# Patient Record
Sex: Female | Born: 1965
Health system: Southern US, Community
[De-identification: ages and names within clinical notes are randomized; demographics above are authoritative.]

## PROBLEM LIST (undated history)

## (undated) DIAGNOSIS — H409 Unspecified glaucoma: Secondary | ICD-10-CM

## (undated) DIAGNOSIS — K219 Gastro-esophageal reflux disease without esophagitis: Secondary | ICD-10-CM

## (undated) DIAGNOSIS — D649 Anemia, unspecified: Secondary | ICD-10-CM

## (undated) DIAGNOSIS — I10 Essential (primary) hypertension: Secondary | ICD-10-CM

## (undated) DIAGNOSIS — Z972 Presence of dental prosthetic device (complete) (partial): Secondary | ICD-10-CM

## (undated) DIAGNOSIS — J069 Acute upper respiratory infection, unspecified: Secondary | ICD-10-CM

## (undated) DIAGNOSIS — Z973 Presence of spectacles and contact lenses: Secondary | ICD-10-CM

## (undated) DIAGNOSIS — N189 Chronic kidney disease, unspecified: Secondary | ICD-10-CM

## (undated) DIAGNOSIS — J189 Pneumonia, unspecified organism: Secondary | ICD-10-CM

## (undated) DIAGNOSIS — R51 Headache: Secondary | ICD-10-CM

## (undated) DIAGNOSIS — I671 Cerebral aneurysm, nonruptured: Secondary | ICD-10-CM

## (undated) HISTORY — DX: Anemia, unspecified: D64.9

## (undated) HISTORY — PX: COLONOSCOPY: SHX174

## (undated) HISTORY — PX: MULTIPLE TOOTH EXTRACTIONS: SHX2053

## (undated) HISTORY — PX: TUBAL LIGATION: SHX77

## (undated) HISTORY — PX: CEREBRAL ANEURYSM REPAIR: SHX164

---

## 1998-12-21 ENCOUNTER — Other Ambulatory Visit: Admission: RE | Admit: 1998-12-21 | Discharge: 1998-12-21 | Payer: Self-pay | Admitting: Obstetrics and Gynecology

## 1999-04-20 ENCOUNTER — Encounter: Admission: RE | Admit: 1999-04-20 | Discharge: 1999-07-19 | Payer: Self-pay | Admitting: Emergency Medicine

## 1999-08-16 ENCOUNTER — Encounter: Admission: RE | Admit: 1999-08-16 | Discharge: 1999-08-16 | Payer: Self-pay | Admitting: Internal Medicine

## 1999-08-25 ENCOUNTER — Encounter: Admission: RE | Admit: 1999-08-25 | Discharge: 1999-08-25 | Payer: Self-pay | Admitting: Internal Medicine

## 1999-08-25 ENCOUNTER — Encounter: Payer: Self-pay | Admitting: Internal Medicine

## 1999-08-25 ENCOUNTER — Ambulatory Visit (HOSPITAL_COMMUNITY): Admission: RE | Admit: 1999-08-25 | Discharge: 1999-08-25 | Payer: Self-pay | Admitting: Internal Medicine

## 1999-10-13 ENCOUNTER — Encounter: Admission: RE | Admit: 1999-10-13 | Discharge: 1999-10-13 | Payer: Self-pay | Admitting: Internal Medicine

## 1999-11-18 ENCOUNTER — Encounter: Admission: RE | Admit: 1999-11-18 | Discharge: 1999-11-18 | Payer: Self-pay | Admitting: Internal Medicine

## 2000-01-26 ENCOUNTER — Encounter: Admission: RE | Admit: 2000-01-26 | Discharge: 2000-01-26 | Payer: Self-pay | Admitting: Internal Medicine

## 2000-02-07 ENCOUNTER — Encounter: Admission: RE | Admit: 2000-02-07 | Discharge: 2000-02-07 | Payer: Self-pay | Admitting: Internal Medicine

## 2000-06-05 ENCOUNTER — Encounter: Admission: RE | Admit: 2000-06-05 | Discharge: 2000-06-05 | Payer: Self-pay | Admitting: Internal Medicine

## 2000-06-16 ENCOUNTER — Emergency Department (HOSPITAL_COMMUNITY): Admission: EM | Admit: 2000-06-16 | Discharge: 2000-06-16 | Payer: Self-pay | Admitting: Emergency Medicine

## 2000-06-19 ENCOUNTER — Encounter: Admission: RE | Admit: 2000-06-19 | Discharge: 2000-06-19 | Payer: Self-pay | Admitting: Internal Medicine

## 2001-02-05 ENCOUNTER — Other Ambulatory Visit: Admission: RE | Admit: 2001-02-05 | Discharge: 2001-02-05 | Payer: Self-pay | Admitting: Obstetrics & Gynecology

## 2001-02-05 ENCOUNTER — Encounter: Admission: RE | Admit: 2001-02-05 | Discharge: 2001-02-05 | Payer: Self-pay | Admitting: Obstetrics & Gynecology

## 2001-02-05 ENCOUNTER — Encounter (INDEPENDENT_AMBULATORY_CARE_PROVIDER_SITE_OTHER): Payer: Self-pay | Admitting: Internal Medicine

## 2001-02-05 LAB — CONVERTED CEMR LAB: Pap Smear: NORMAL

## 2001-02-28 ENCOUNTER — Emergency Department (HOSPITAL_COMMUNITY): Admission: EM | Admit: 2001-02-28 | Discharge: 2001-02-28 | Payer: Self-pay | Admitting: Emergency Medicine

## 2001-02-28 ENCOUNTER — Encounter: Payer: Self-pay | Admitting: Emergency Medicine

## 2001-03-04 ENCOUNTER — Encounter: Admission: RE | Admit: 2001-03-04 | Discharge: 2001-03-04 | Payer: Self-pay | Admitting: Internal Medicine

## 2001-03-11 ENCOUNTER — Encounter: Payer: Self-pay | Admitting: Internal Medicine

## 2001-03-11 ENCOUNTER — Encounter: Admission: RE | Admit: 2001-03-11 | Discharge: 2001-03-11 | Payer: Self-pay | Admitting: Internal Medicine

## 2001-03-11 ENCOUNTER — Ambulatory Visit (HOSPITAL_COMMUNITY): Admission: RE | Admit: 2001-03-11 | Discharge: 2001-03-11 | Payer: Self-pay | Admitting: Obstetrics & Gynecology

## 2001-03-11 ENCOUNTER — Ambulatory Visit (HOSPITAL_COMMUNITY): Admission: RE | Admit: 2001-03-11 | Discharge: 2001-03-11 | Payer: Self-pay | Admitting: Internal Medicine

## 2001-03-18 ENCOUNTER — Encounter: Admission: RE | Admit: 2001-03-18 | Discharge: 2001-03-18 | Payer: Self-pay | Admitting: Internal Medicine

## 2001-03-18 ENCOUNTER — Encounter: Payer: Self-pay | Admitting: Internal Medicine

## 2001-03-26 ENCOUNTER — Encounter: Admission: RE | Admit: 2001-03-26 | Discharge: 2001-03-26 | Payer: Self-pay | Admitting: Internal Medicine

## 2001-05-10 ENCOUNTER — Encounter: Payer: Self-pay | Admitting: Internal Medicine

## 2001-05-10 ENCOUNTER — Encounter: Admission: RE | Admit: 2001-05-10 | Discharge: 2001-05-10 | Payer: Self-pay | Admitting: Internal Medicine

## 2001-05-10 ENCOUNTER — Ambulatory Visit (HOSPITAL_COMMUNITY): Admission: RE | Admit: 2001-05-10 | Discharge: 2001-05-10 | Payer: Self-pay | Admitting: Internal Medicine

## 2001-06-10 ENCOUNTER — Encounter: Admission: RE | Admit: 2001-06-10 | Discharge: 2001-06-10 | Payer: Self-pay | Admitting: Internal Medicine

## 2001-08-05 ENCOUNTER — Encounter: Admission: RE | Admit: 2001-08-05 | Discharge: 2001-08-05 | Payer: Self-pay | Admitting: Internal Medicine

## 2001-10-15 ENCOUNTER — Encounter: Payer: Self-pay | Admitting: Internal Medicine

## 2001-10-15 ENCOUNTER — Encounter: Admission: RE | Admit: 2001-10-15 | Discharge: 2001-10-15 | Payer: Self-pay | Admitting: Internal Medicine

## 2002-01-27 ENCOUNTER — Encounter: Admission: RE | Admit: 2002-01-27 | Discharge: 2002-01-27 | Payer: Self-pay | Admitting: Internal Medicine

## 2002-02-24 ENCOUNTER — Encounter: Admission: RE | Admit: 2002-02-24 | Discharge: 2002-02-24 | Payer: Self-pay | Admitting: Internal Medicine

## 2002-04-10 ENCOUNTER — Encounter: Admission: RE | Admit: 2002-04-10 | Discharge: 2002-04-10 | Payer: Self-pay | Admitting: Internal Medicine

## 2002-12-23 ENCOUNTER — Encounter: Admission: RE | Admit: 2002-12-23 | Discharge: 2002-12-23 | Payer: Self-pay | Admitting: Internal Medicine

## 2003-06-24 ENCOUNTER — Encounter: Admission: RE | Admit: 2003-06-24 | Discharge: 2003-06-24 | Payer: Self-pay | Admitting: Internal Medicine

## 2004-02-11 ENCOUNTER — Encounter: Admission: RE | Admit: 2004-02-11 | Discharge: 2004-02-11 | Payer: Self-pay | Admitting: Internal Medicine

## 2004-02-23 ENCOUNTER — Encounter: Admission: RE | Admit: 2004-02-23 | Discharge: 2004-02-23 | Payer: Self-pay | Admitting: Internal Medicine

## 2004-11-01 ENCOUNTER — Emergency Department (HOSPITAL_COMMUNITY): Admission: EM | Admit: 2004-11-01 | Discharge: 2004-11-01 | Payer: Self-pay | Admitting: Family Medicine

## 2005-01-05 ENCOUNTER — Ambulatory Visit: Payer: Self-pay | Admitting: Internal Medicine

## 2005-02-07 ENCOUNTER — Ambulatory Visit: Payer: Self-pay | Admitting: Internal Medicine

## 2005-11-22 ENCOUNTER — Ambulatory Visit: Payer: Self-pay | Admitting: Internal Medicine

## 2005-11-29 ENCOUNTER — Ambulatory Visit: Payer: Self-pay | Admitting: Internal Medicine

## 2006-01-25 ENCOUNTER — Ambulatory Visit: Payer: Self-pay | Admitting: Internal Medicine

## 2006-02-28 ENCOUNTER — Ambulatory Visit: Payer: Self-pay | Admitting: Internal Medicine

## 2006-03-26 ENCOUNTER — Ambulatory Visit: Payer: Self-pay | Admitting: Internal Medicine

## 2006-04-02 ENCOUNTER — Ambulatory Visit: Payer: Self-pay | Admitting: Internal Medicine

## 2006-04-09 ENCOUNTER — Encounter: Admission: RE | Admit: 2006-04-09 | Discharge: 2006-04-09 | Payer: Self-pay | Admitting: Internal Medicine

## 2006-07-26 ENCOUNTER — Encounter (INDEPENDENT_AMBULATORY_CARE_PROVIDER_SITE_OTHER): Payer: Self-pay | Admitting: Internal Medicine

## 2006-07-26 DIAGNOSIS — J189 Pneumonia, unspecified organism: Secondary | ICD-10-CM | POA: Insufficient documentation

## 2006-07-26 DIAGNOSIS — N6029 Fibroadenosis of unspecified breast: Secondary | ICD-10-CM | POA: Insufficient documentation

## 2006-07-26 DIAGNOSIS — J329 Chronic sinusitis, unspecified: Secondary | ICD-10-CM | POA: Insufficient documentation

## 2006-07-26 DIAGNOSIS — E119 Type 2 diabetes mellitus without complications: Secondary | ICD-10-CM | POA: Insufficient documentation

## 2006-09-20 ENCOUNTER — Ambulatory Visit: Payer: Self-pay | Admitting: Internal Medicine

## 2006-09-20 ENCOUNTER — Encounter (INDEPENDENT_AMBULATORY_CARE_PROVIDER_SITE_OTHER): Payer: Self-pay | Admitting: Internal Medicine

## 2006-09-20 LAB — CONVERTED CEMR LAB
Alkaline Phosphatase: 79 units/L (ref 39–117)
Basophils Absolute: 0 10*3/uL (ref 0.0–0.1)
CO2: 23 meq/L (ref 19–32)
Creatinine, Ser: 1.03 mg/dL (ref 0.40–1.20)
Eosinophils Relative: 3 % (ref 0–5)
HCT: 38 % (ref 36.0–46.0)
Hemoglobin: 13.1 g/dL (ref 12.0–15.0)
Lymphocytes Relative: 26 % (ref 12–46)
MCHC: 34.5 g/dL (ref 30.0–36.0)
Neutro Abs: 3.8 10*3/uL (ref 1.7–7.7)
Platelets: 271 10*3/uL (ref 150–400)
Potassium: 4.5 meq/L (ref 3.5–5.3)
RBC: 4.68 M/uL (ref 3.87–5.11)
Sodium: 136 meq/L (ref 135–145)
TSH: 0.79 microintl units/mL (ref 0.350–5.50)
Total Bilirubin: 0.5 mg/dL (ref 0.3–1.2)

## 2006-09-25 ENCOUNTER — Emergency Department (HOSPITAL_COMMUNITY): Admission: EM | Admit: 2006-09-25 | Discharge: 2006-09-25 | Payer: Self-pay | Admitting: Family Medicine

## 2006-10-18 ENCOUNTER — Emergency Department (HOSPITAL_COMMUNITY): Admission: EM | Admit: 2006-10-18 | Discharge: 2006-10-18 | Payer: Self-pay | Admitting: Family Medicine

## 2006-10-26 IMAGING — CR CHEST
1 series · 2 of 2 positions shown · non-contrast
Comparison: none

NAME: HOME

EXAM: MAMMOGRAPHY SCREENING
REASON: MAMMO...HM[PHONE_NUMBER]
The breasts reveal a moderately dense parenchymal pattern. There are
dominant benign appearing lymph nodes in both axillary regions. I see
no malignant appearing grouping of microcalcification nor areas of
architectural distortion.

[Series 1: view not recorded · 0.17mm/px · 2 of 2 slices shown]
[im 1/2]
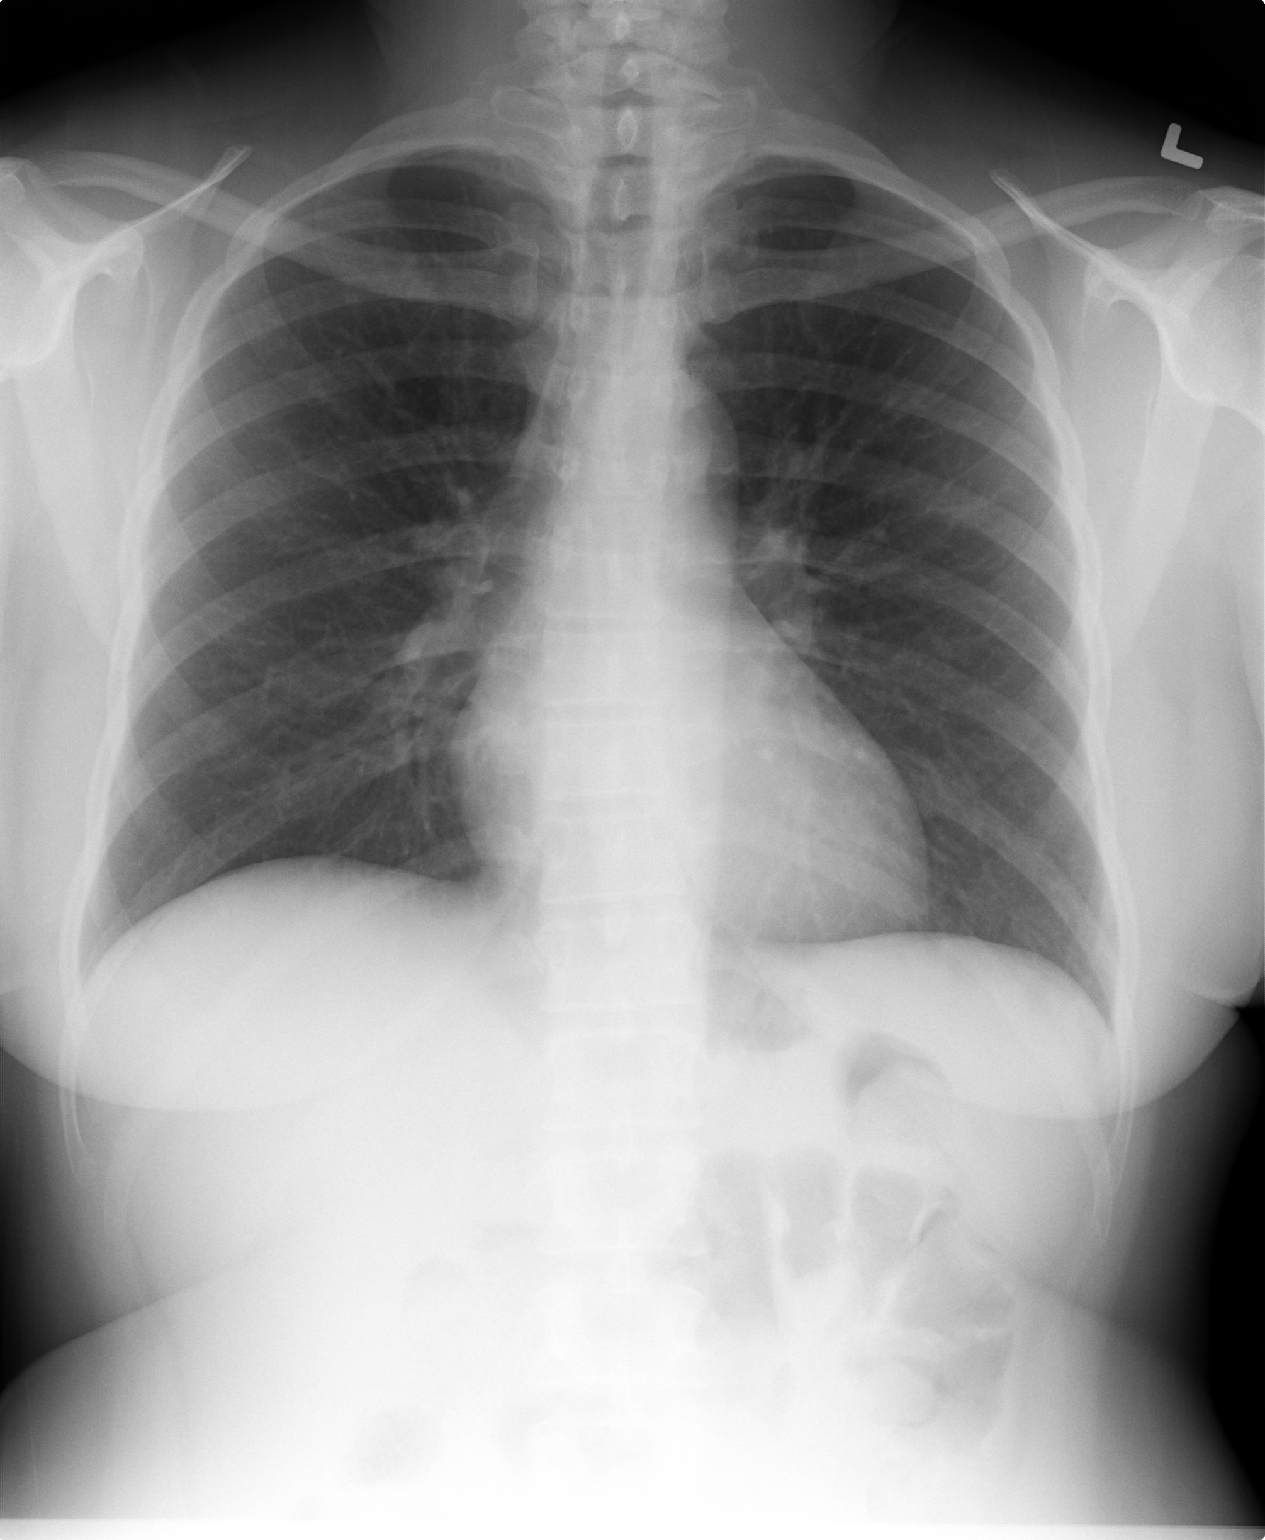
[im 2/2]
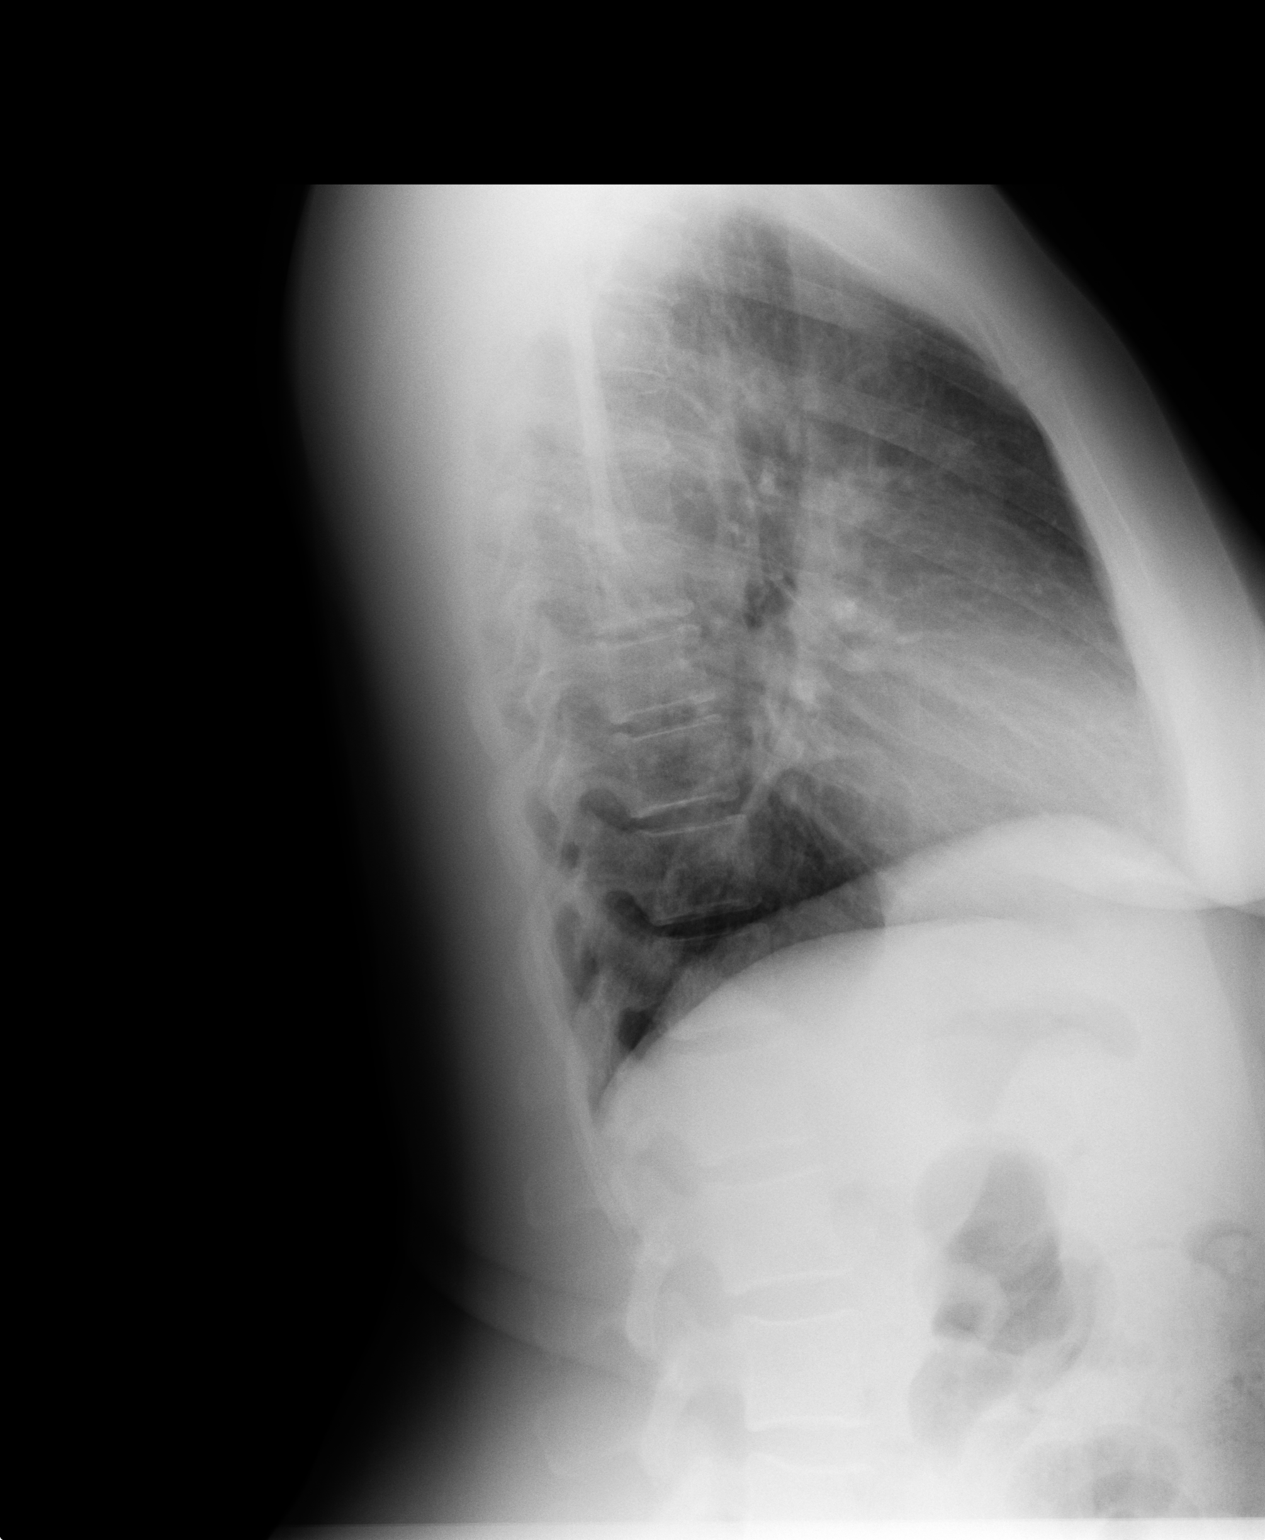

[2 of 2 positions shown; findings below may reference images not displayed]

IMPRESSION: Moderately dense breasts without finding suspicious for malignancy.

BI-RADS: Category 2--Benign Findings.

RECOMMENDATION: Please continue to encourage yearly follow up.

NAME: HOME

HOME NEGATIVE MAMMOGRAM REPORT DOES NOT PRECLUDE BIOPSY OR OTHER EVALUATION

<pp>

## 2007-05-24 ENCOUNTER — Emergency Department (HOSPITAL_COMMUNITY): Admission: EM | Admit: 2007-05-24 | Discharge: 2007-05-24 | Payer: Self-pay | Admitting: Family Medicine

## 2008-07-23 IMAGING — CR DG CHEST 2V
2 series · 2 of 2 positions shown · non-contrast
Comparison: None

CLINICAL DATA: Fever.  Flu symptoms.

CHEST - 2 VIEW

[w chest pa]
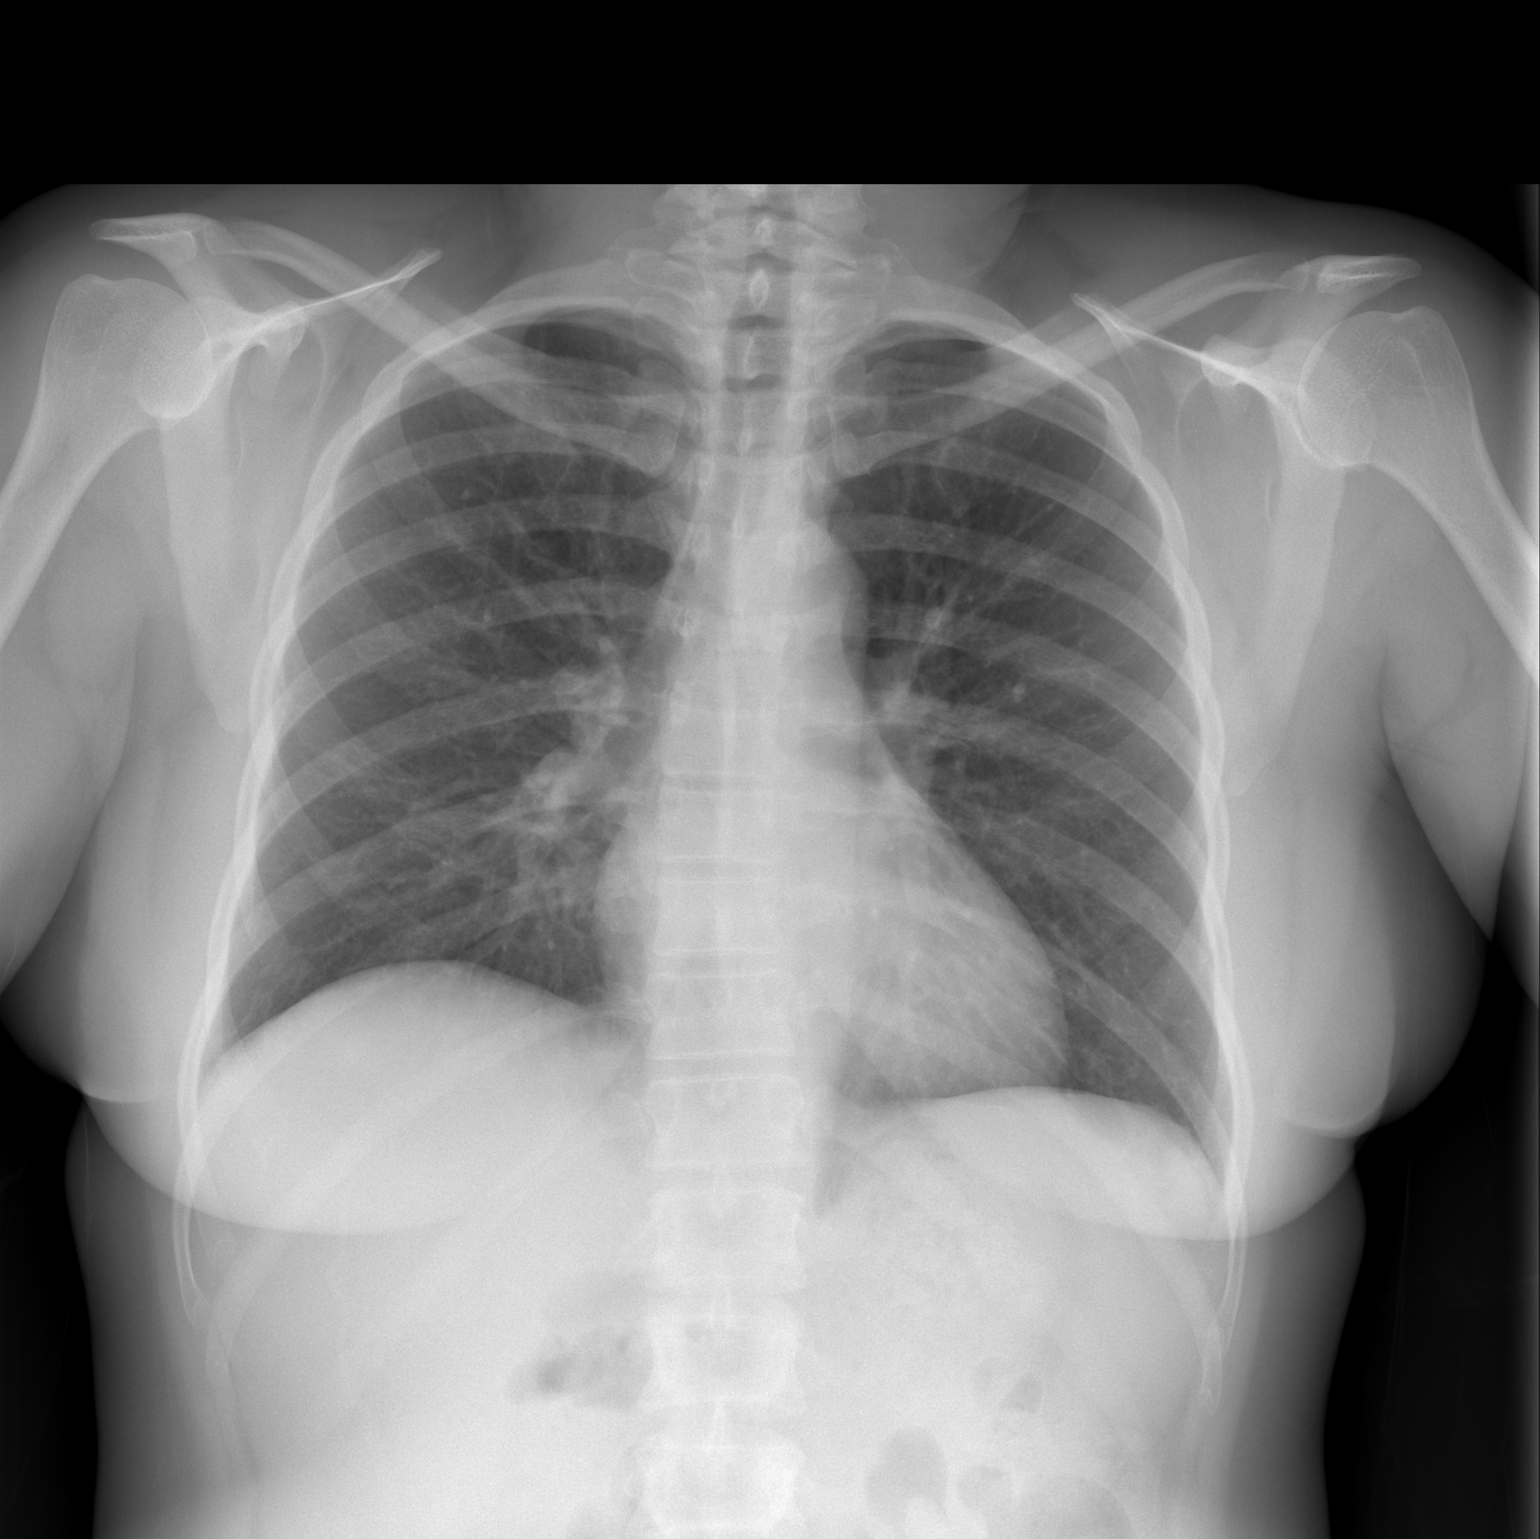

[w chest lat]
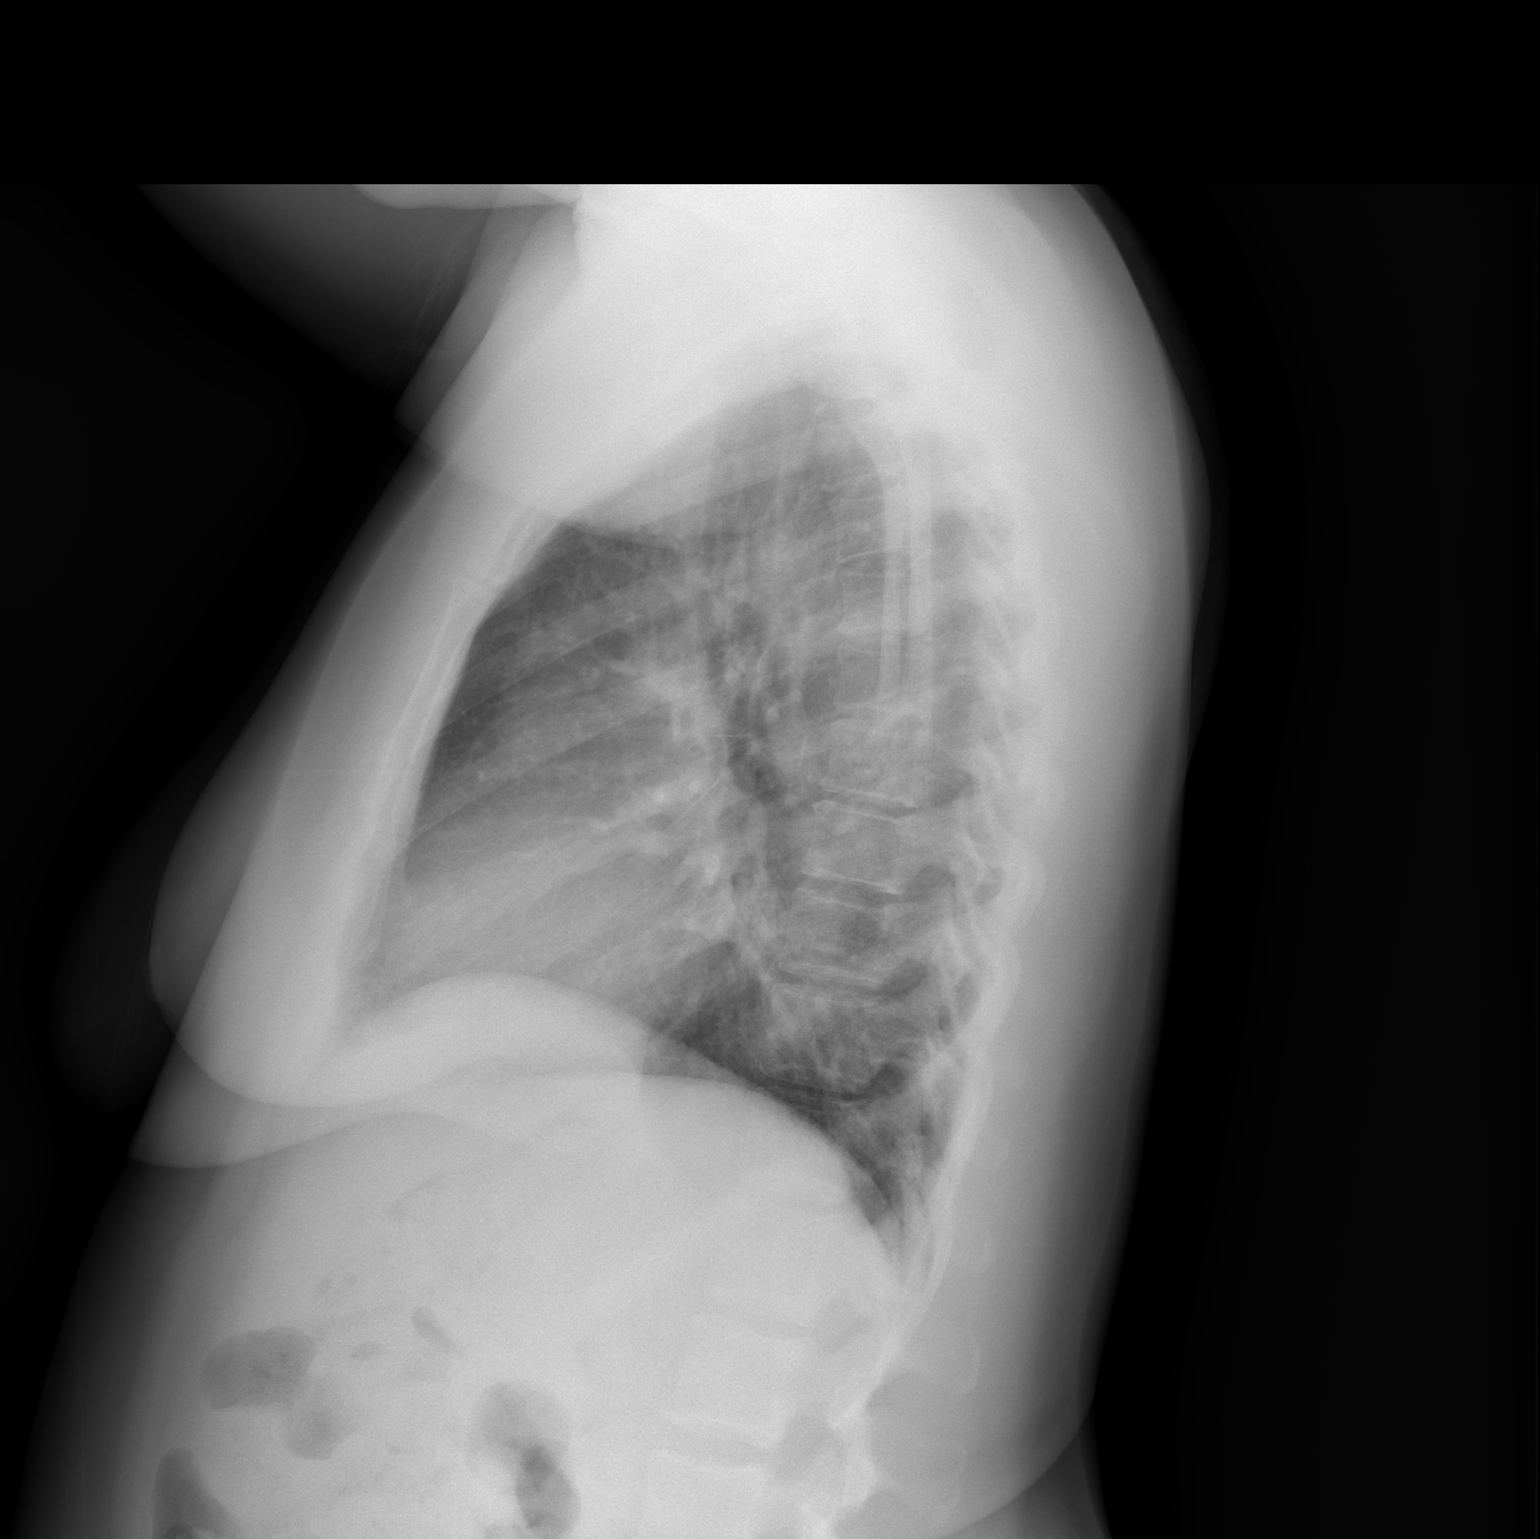

[2 of 2 positions shown; findings below may reference images not displayed]

FINDINGS: The heart size and mediastinal contours are within normal
limits.  Both lungs are clear.  The visualized skeletal structures
are unremarkable.
IMPRESSION: No active cardiopulmonary disease.

## 2008-07-24 ENCOUNTER — Inpatient Hospital Stay (HOSPITAL_COMMUNITY): Admission: EM | Admit: 2008-07-24 | Discharge: 2008-07-29 | Payer: Self-pay | Admitting: Emergency Medicine

## 2008-07-24 IMAGING — CR DG ABDOMEN 1V
1 series · 1 of 1 positions shown · non-contrast
Comparison: None available.

CLINICAL DATA: Nausea.  No abdominal pain.

ABDOMEN - 1 VIEW

[view not recorded]
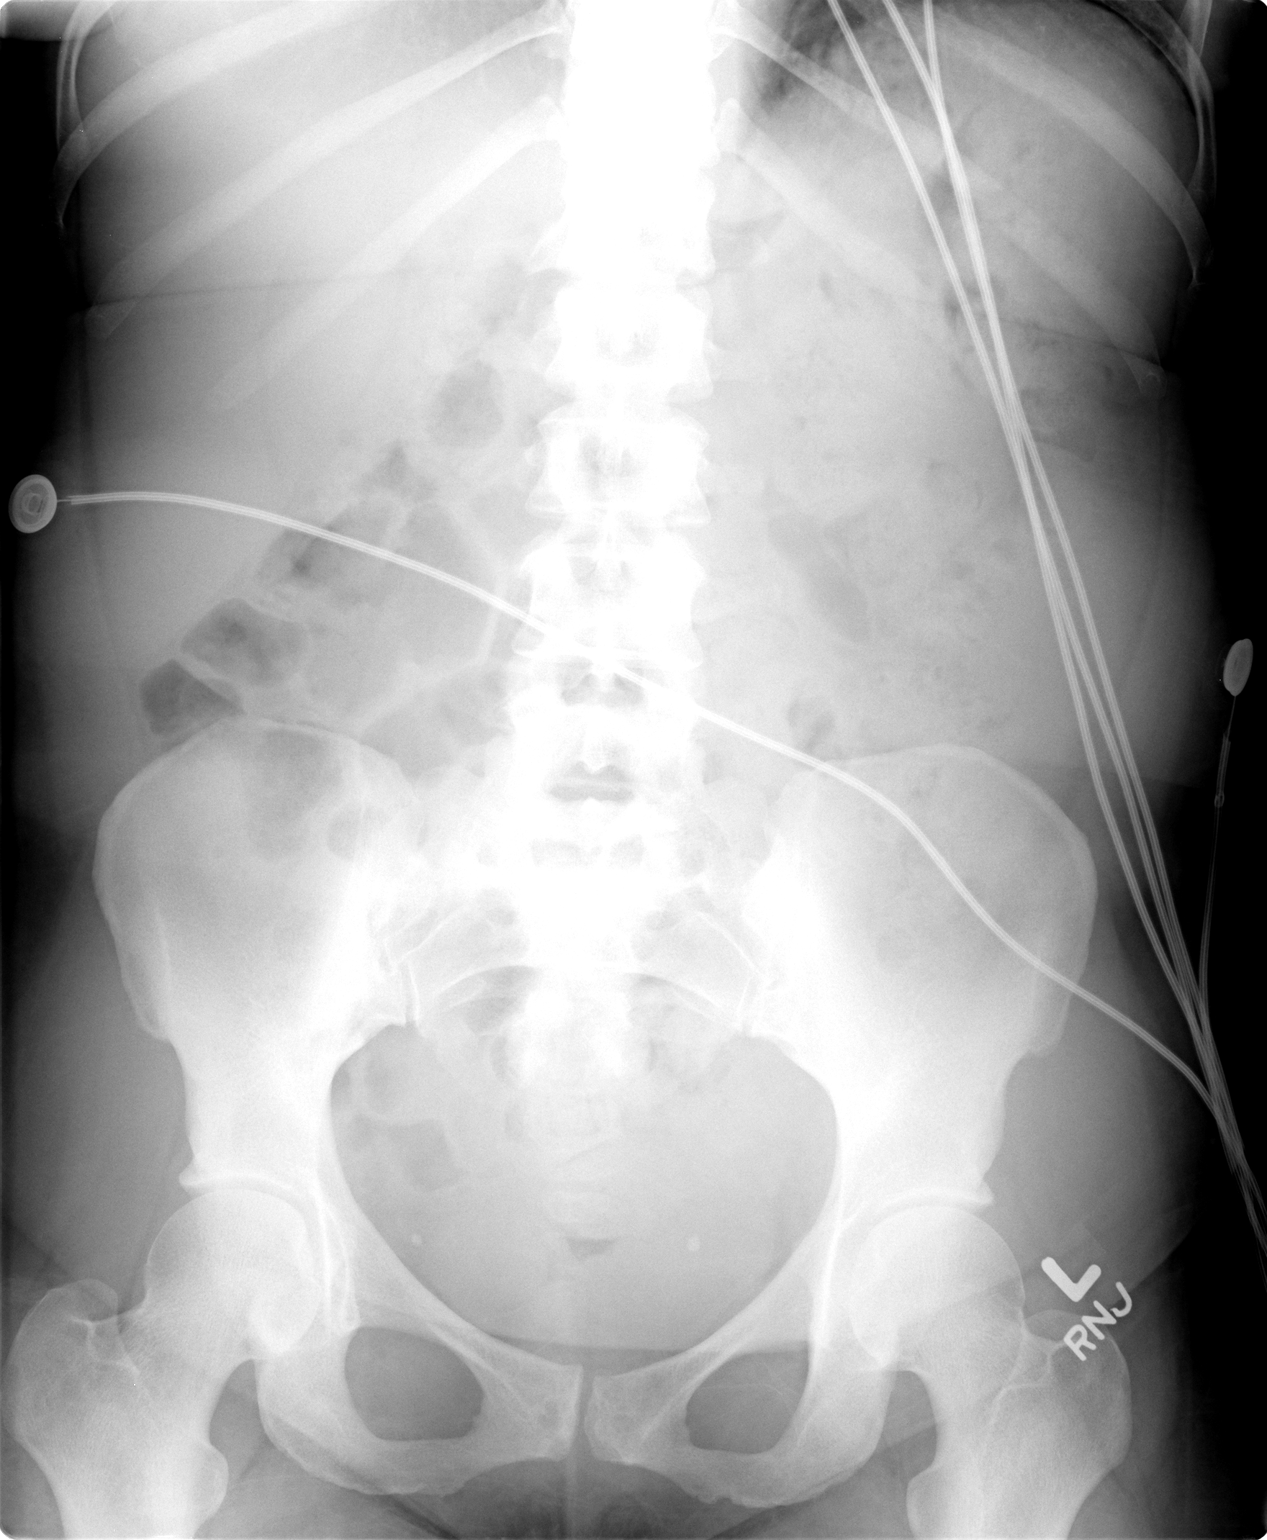

[1 of 1 positions shown; findings below may reference images not displayed]

FINDINGS: The bowel gas pattern is normal.  No unexpected abdominal
calcifications or focal bony abnormality.
IMPRESSION: Negative exam.

## 2008-07-24 IMAGING — CR DG CHEST 1V PORT
1 series · 1 of 1 positions shown · non-contrast
Comparison: [DATE].

CLINICAL DATA: Fever.  Influenza.

PORTABLE CHEST - 1 VIEW

[view not recorded]
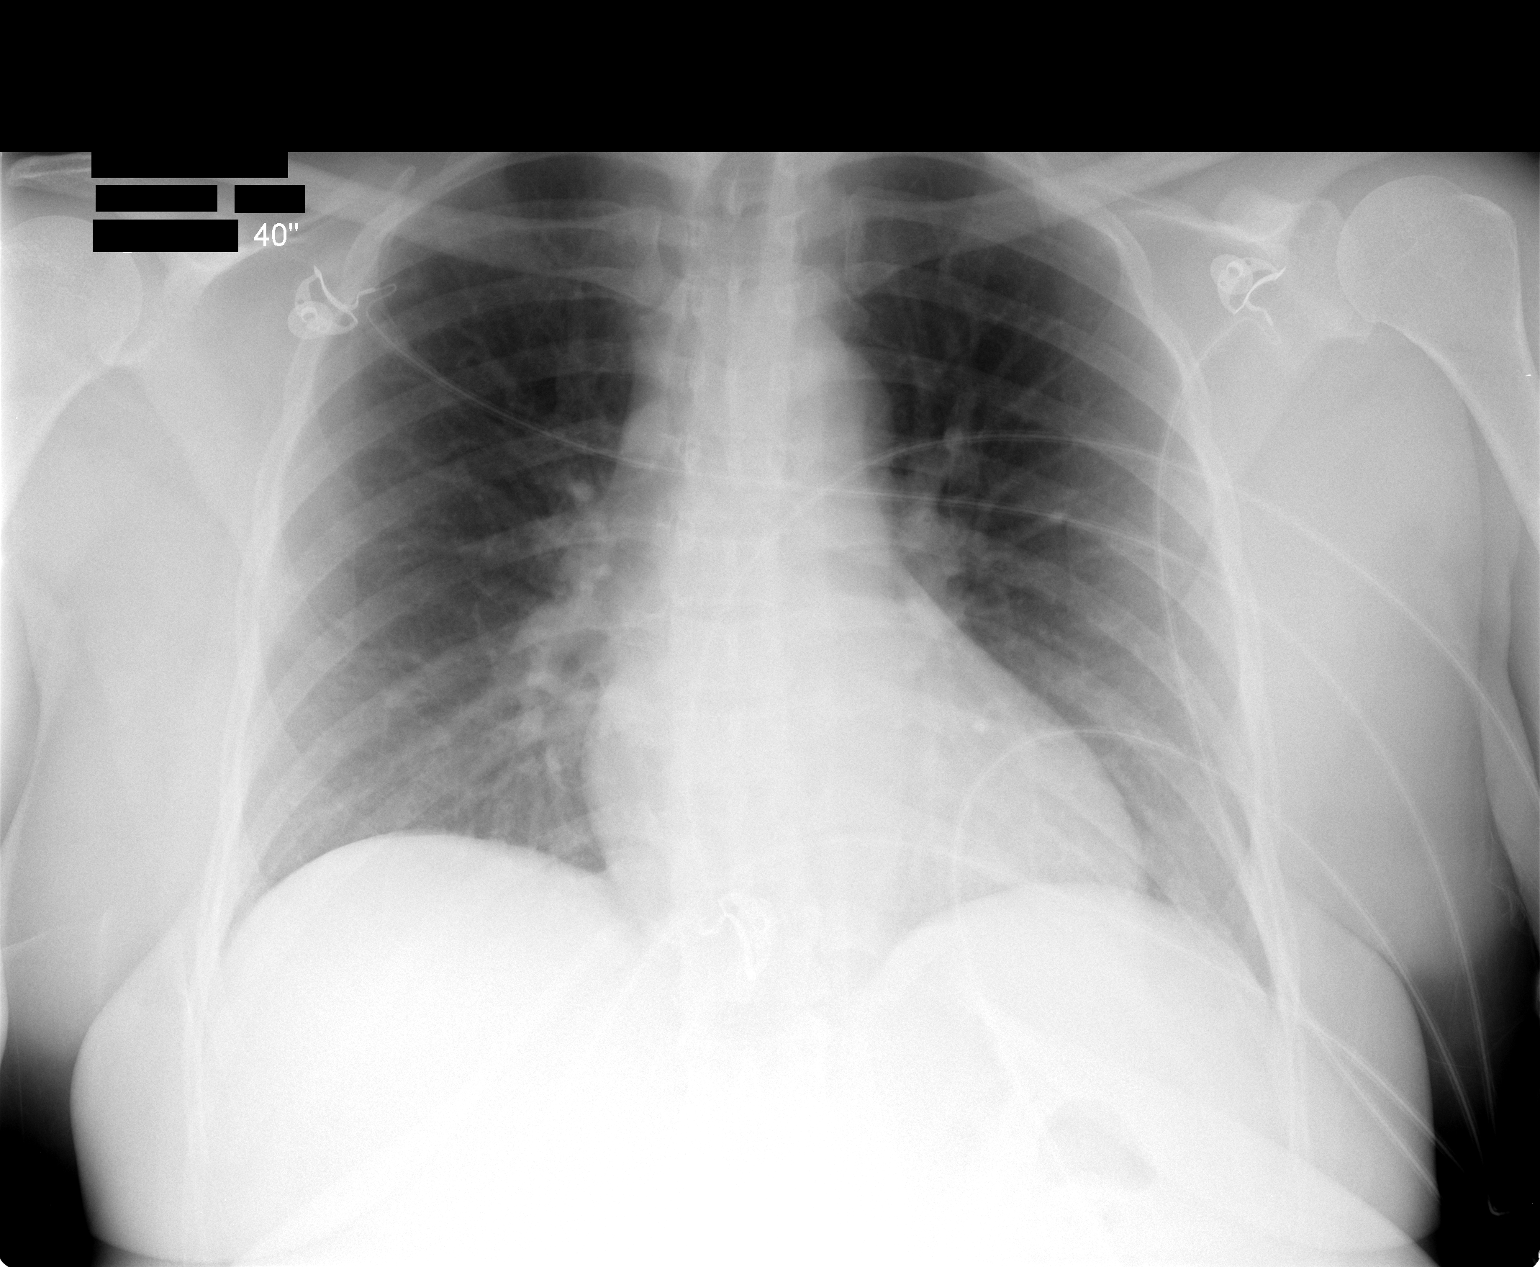

[1 of 1 positions shown; findings below may reference images not displayed]

FINDINGS: The lungs remain clear and there is no infiltrate or
effusion.  No edema is present.
IMPRESSION: No significant abnormality and no interval change.

## 2008-07-24 IMAGING — US US RENAL
1 series · 14 of 25 positions shown · non-contrast
Comparison: None.

CLINICAL DATA: Urinary tract infection.  Renal failure.

RENAL/ URINARY TRACT ULTRASOUND
TECHNIQUE: Complete ultrasound examination of the urinary tract
was performed including evaluation of the kidneys, renal collecting
systems, and urinary bladder.

[Series 1: unknown · 0.27mm/px · 14 of 33 slices shown]
[im 1/33]
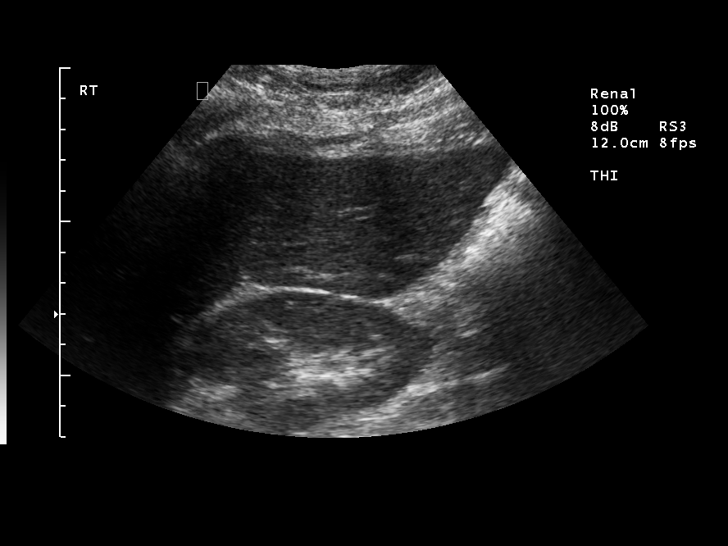
[im 3/33]
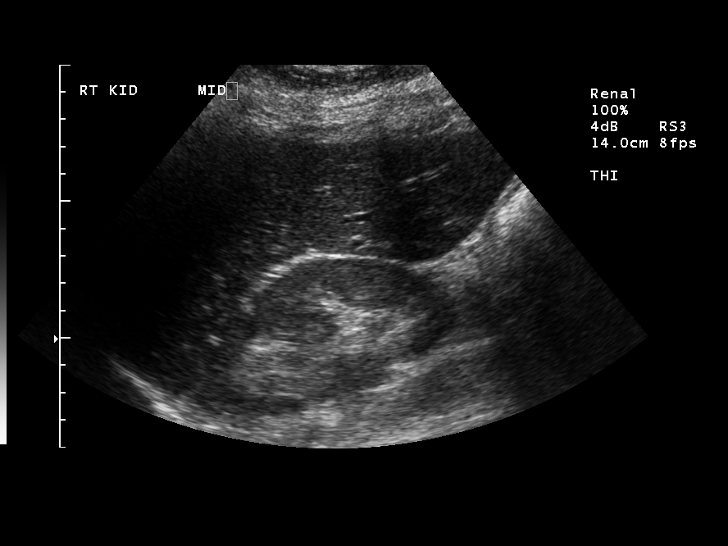
[im 6/33]
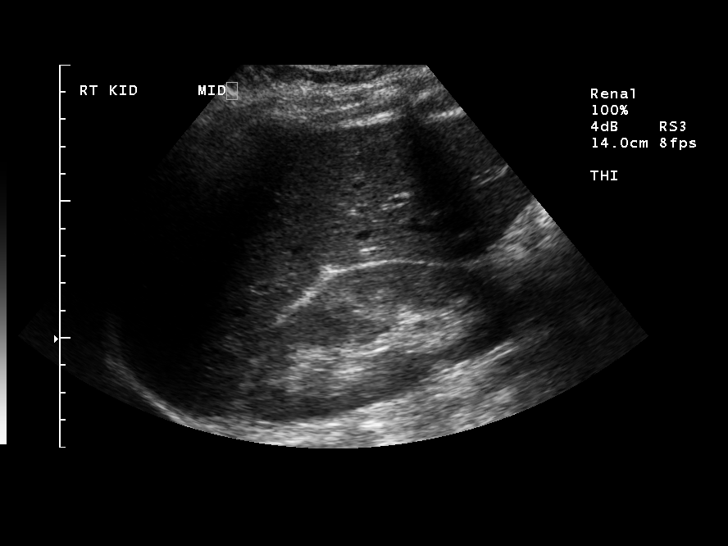
[im 9/33]
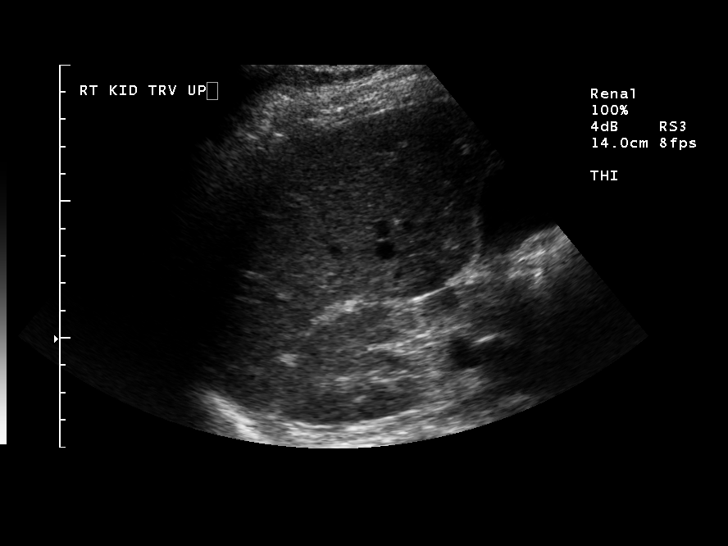
[im 11/33]
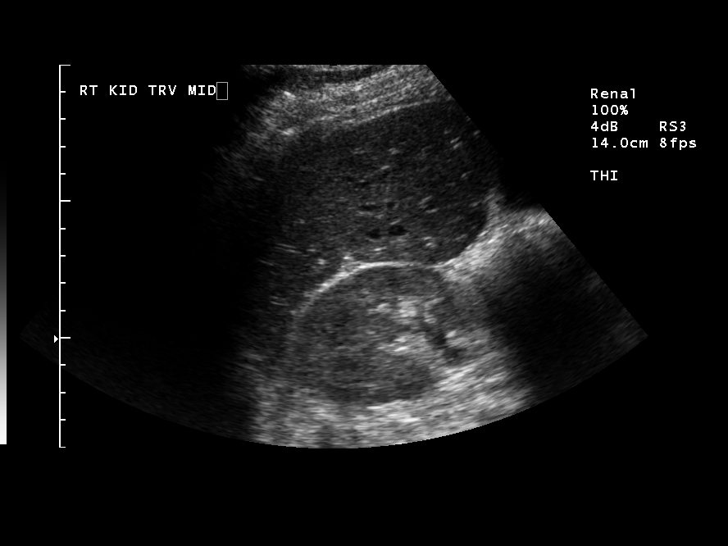
[im 13/33]
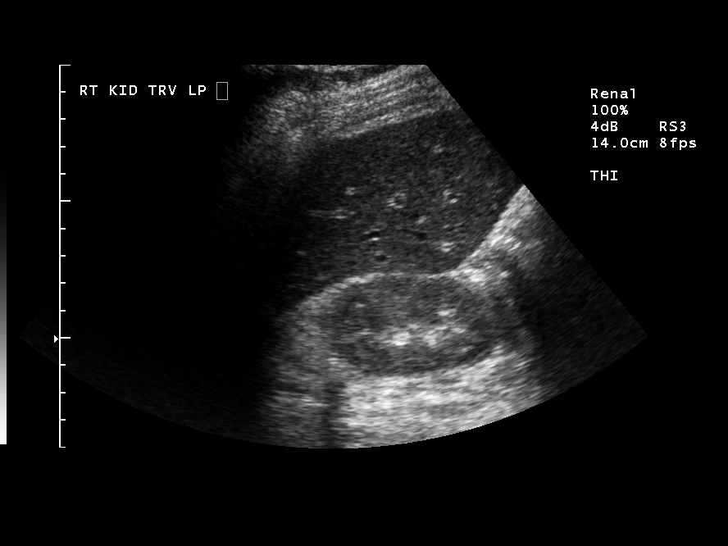
[im 15/33]
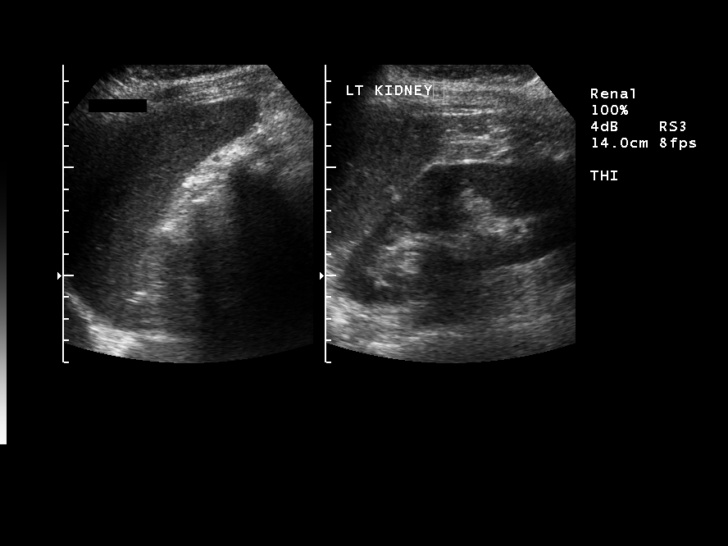
[im 18/33]
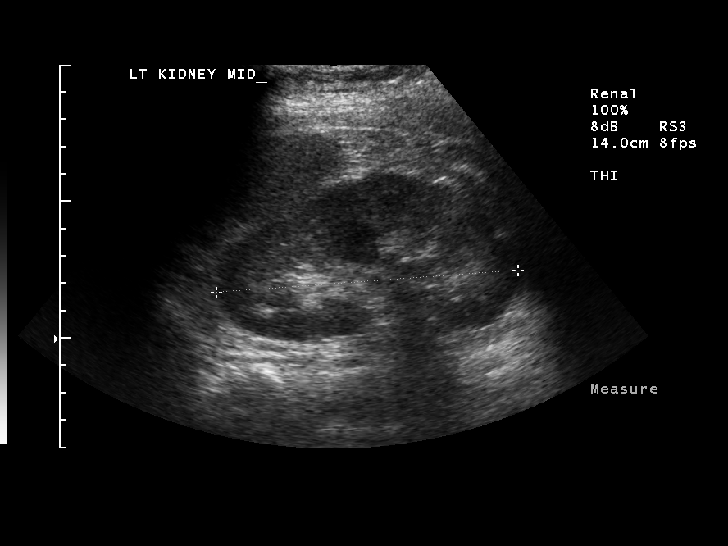
[im 21/33]
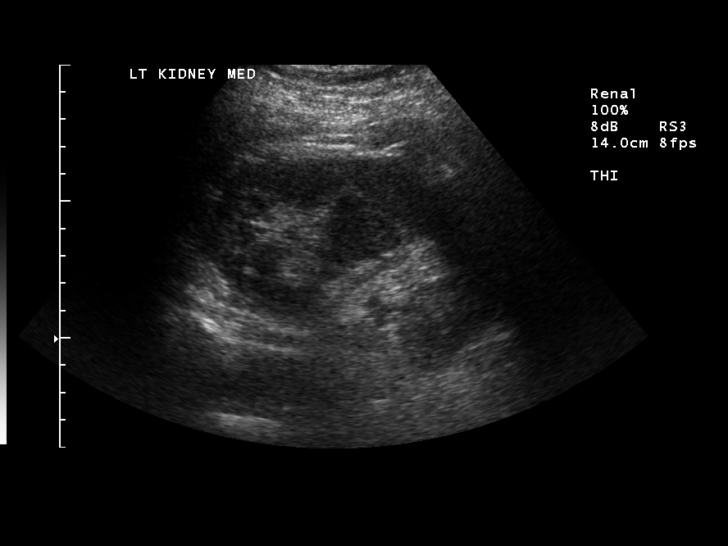
[im 22/33]
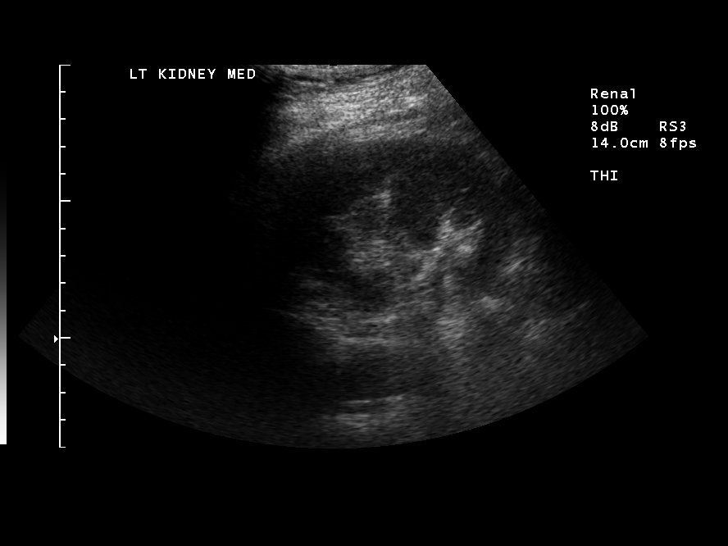
[im 25/33]
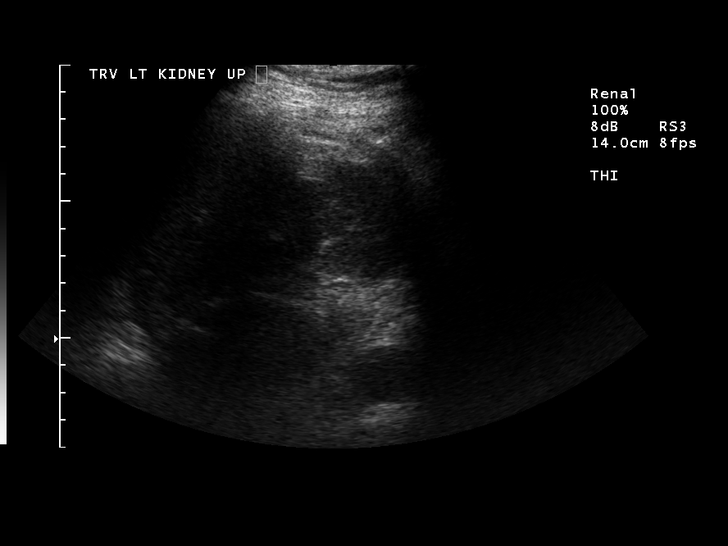
[im 27/33]
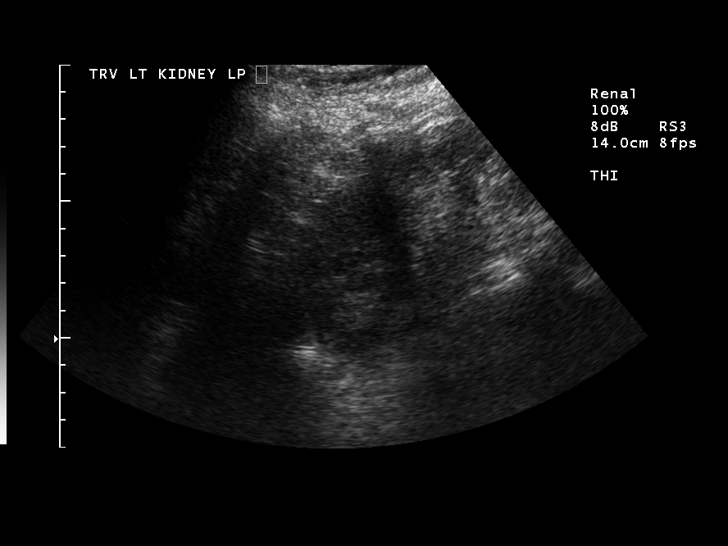
[im 30/33]
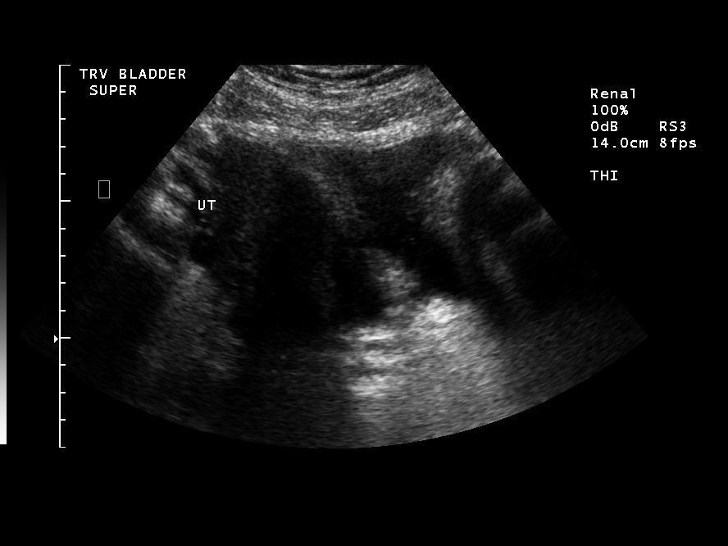
[im 33/33]
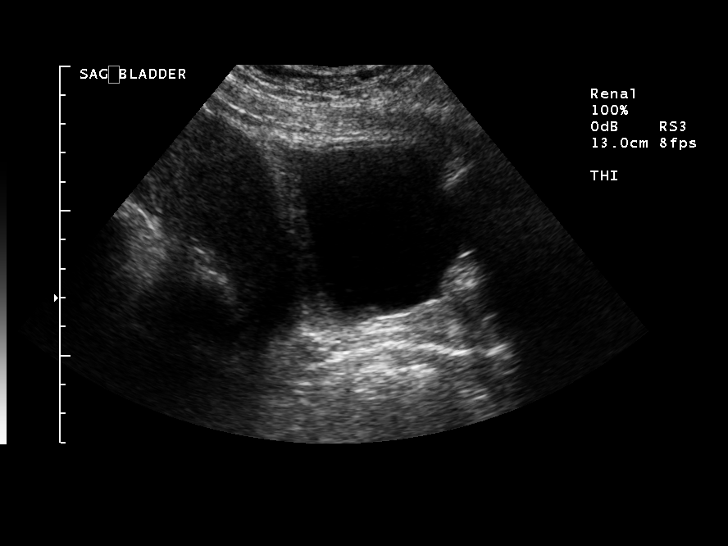

[14 of 25 positions shown; findings below may reference images not displayed]

FINDINGS: Both kidneys are within normal limits in size and
parenchymal echogenicity.  No renal parenchymal lesions are seen.
There is no evidence of hydronephrosis.  No perinephric
abnormalities are seen.  Images of the urinary bladder are
unremarkable for the degree of bladder filling.
IMPRESSION: Normal study.

## 2008-07-27 IMAGING — CR DG CHEST 1V PORT
1 series · 1 of 1 positions shown · non-contrast
Comparison: [DATE].

CLINICAL DATA: PICC line placement.

PORTABLE CHEST - 1 VIEW

[view not recorded]
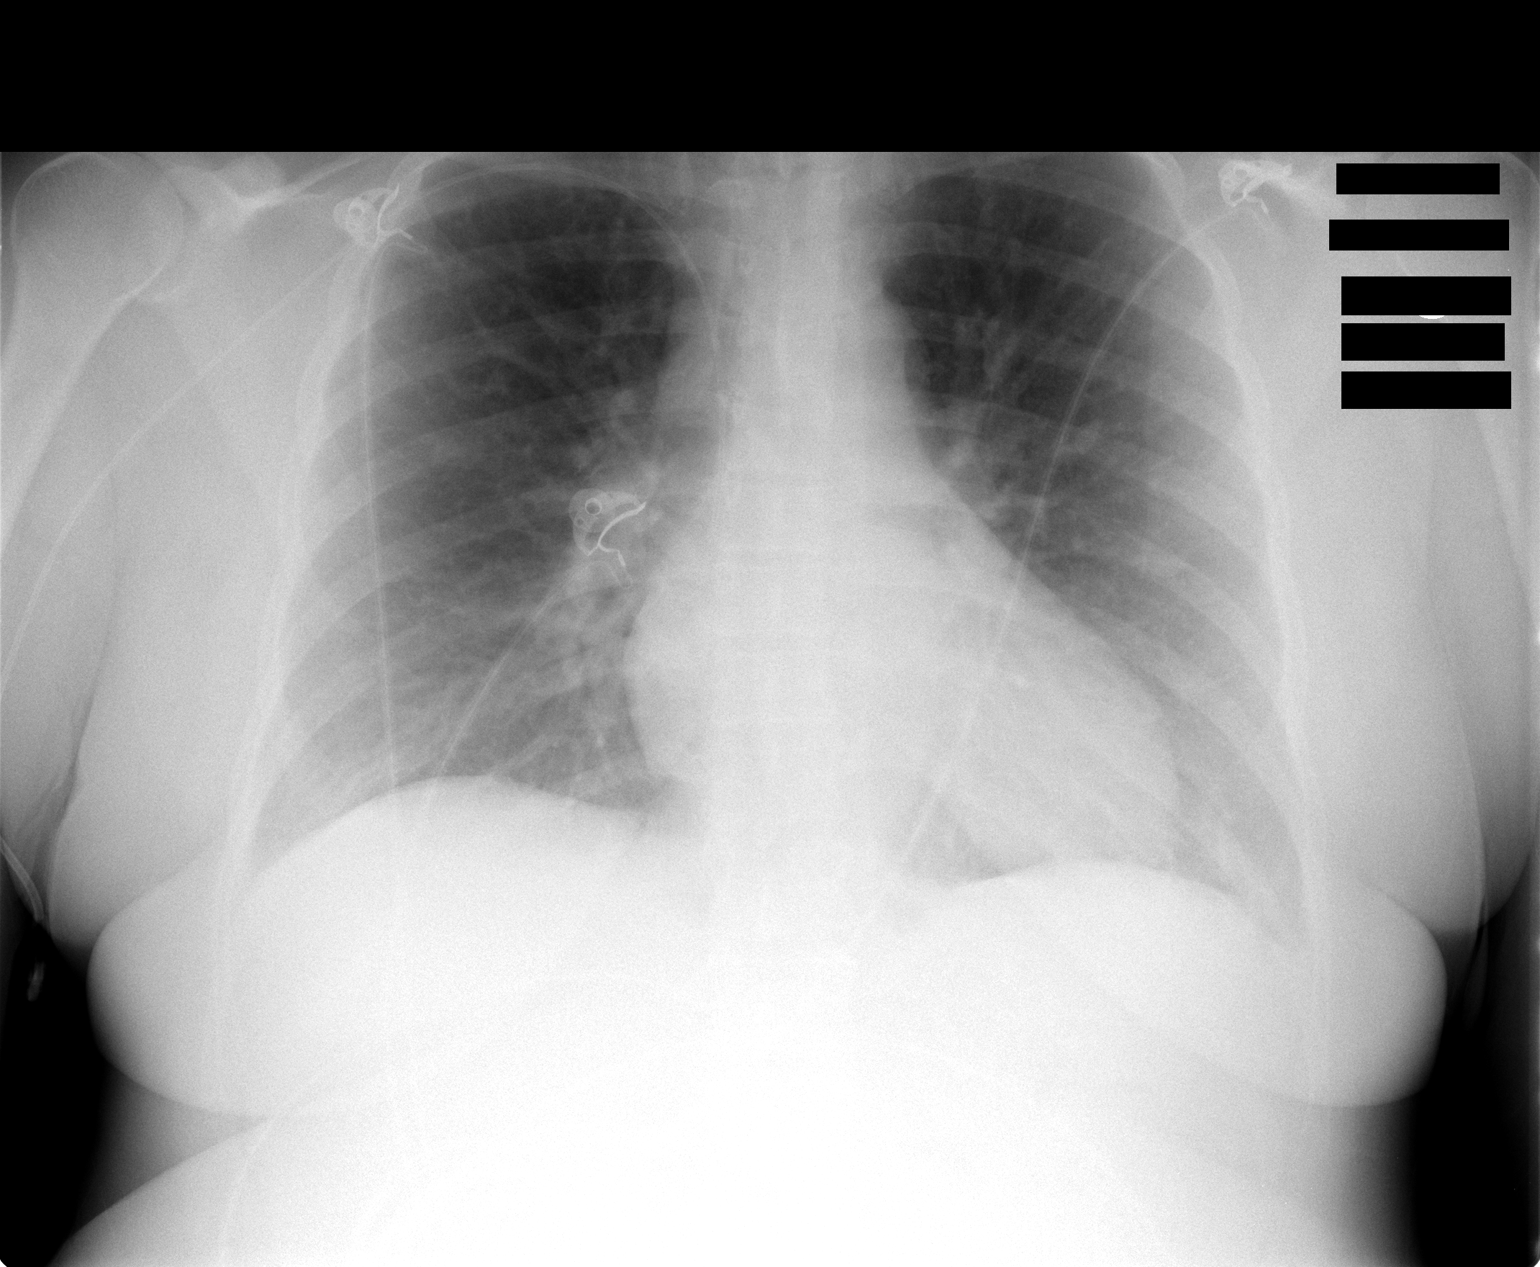

[1 of 1 positions shown; findings below may reference images not displayed]

FINDINGS: Right PICC line tip distal superior vena cava.  Central
pulmonary vascular prominence.  Left base subsegmental atelectasis.
No pneumothorax.  Heart size within normal limits.
IMPRESSION: Right PICC line tip distal superior vena cava.

Central pulmonary vascular prominence.

Left base subsegmental atelectasis.

## 2009-02-01 ENCOUNTER — Emergency Department (HOSPITAL_COMMUNITY): Admission: EM | Admit: 2009-02-01 | Discharge: 2009-02-02 | Payer: Self-pay | Admitting: Emergency Medicine
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
## 2010-12-12 ENCOUNTER — Inpatient Hospital Stay (HOSPITAL_COMMUNITY)
Admission: EM | Admit: 2010-12-12 | Discharge: 2010-12-20 | DRG: 833 | Disposition: A | Discharge status: Home / Self Care | Payer: BC Managed Care – PPO | Attending: Neurosurgery | Admitting: Neurosurgery

## 2010-12-12 ENCOUNTER — Other Ambulatory Visit: Payer: Self-pay | Admitting: Family Medicine

## 2010-12-12 ENCOUNTER — Emergency Department (HOSPITAL_COMMUNITY): Payer: BC Managed Care – PPO

## 2010-12-12 ENCOUNTER — Ambulatory Visit
Admission: RE | Admit: 2010-12-12 | Discharge: 2010-12-12 | Disposition: A | Discharge status: Home / Self Care | Payer: BC Managed Care – PPO | Source: Ambulatory Visit | Attending: Family Medicine | Admitting: Family Medicine

## 2010-12-12 DIAGNOSIS — E119 Type 2 diabetes mellitus without complications: Secondary | ICD-10-CM | POA: Diagnosis present

## 2010-12-12 DIAGNOSIS — I609 Nontraumatic subarachnoid hemorrhage, unspecified: Principal | ICD-10-CM | POA: Diagnosis present

## 2010-12-12 DIAGNOSIS — R1110 Vomiting, unspecified: Secondary | ICD-10-CM

## 2010-12-12 DIAGNOSIS — J309 Allergic rhinitis, unspecified: Secondary | ICD-10-CM | POA: Diagnosis present

## 2010-12-12 DIAGNOSIS — I10 Essential (primary) hypertension: Secondary | ICD-10-CM | POA: Diagnosis present

## 2010-12-12 LAB — DIFFERENTIAL
Basophils Absolute: 0 10*3/uL (ref 0.0–0.1)
Eosinophils Absolute: 0 10*3/uL (ref 0.0–0.7)
Monocytes Absolute: 0.4 10*3/uL (ref 0.1–1.0)
Monocytes Relative: 7 % (ref 3–12)
Neutro Abs: 3.8 10*3/uL (ref 1.7–7.7)
Neutrophils Relative %: 73 % (ref 43–77)

## 2010-12-12 LAB — CBC
MCHC: 34.8 g/dL (ref 30.0–36.0)
MCV: 81.9 fL (ref 78.0–100.0)

## 2010-12-12 LAB — BASIC METABOLIC PANEL
BUN: 21 mg/dL (ref 6–23)
Calcium: 9.7 mg/dL (ref 8.4–10.5)
Creatinine, Ser: 1.3 mg/dL — ABNORMAL HIGH (ref 0.4–1.2)
GFR calc Af Amer: 53 mL/min — ABNORMAL LOW (ref >60)
GFR calc non Af Amer: 44 mL/min — ABNORMAL LOW (ref >60)
Glucose, Bld: 169 mg/dL — ABNORMAL HIGH (ref 70–99)
Potassium: 4.5 mEq/L (ref 3.5–5.1)
Sodium: 139 mEq/L (ref 135–145)

## 2010-12-12 IMAGING — CT CT HEAD W/O CM
1 of 3 series · 13 of 30 positions shown, 17 images · non-contrast
Comparison: None.

CLINICAL DATA: Syncope.  Headaches.  Nausea and vomiting.

CT HEAD WITHOUT CONTRAST
TECHNIQUE: Contiguous axial images were obtained from the base of
the skull through the vertex without contrast.

[Series 2: head w/o · axial · non-contrast · 0.49mm/px · z∈[+13,+132]mm · 13 of 36 slices shown, 17 images]
[im 3/36  brain]
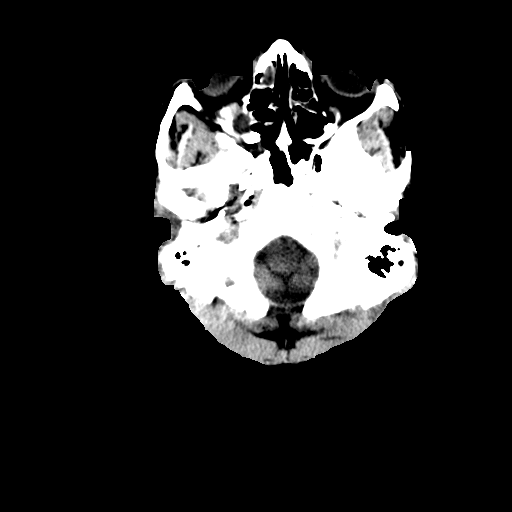
[im 3/36  bone]
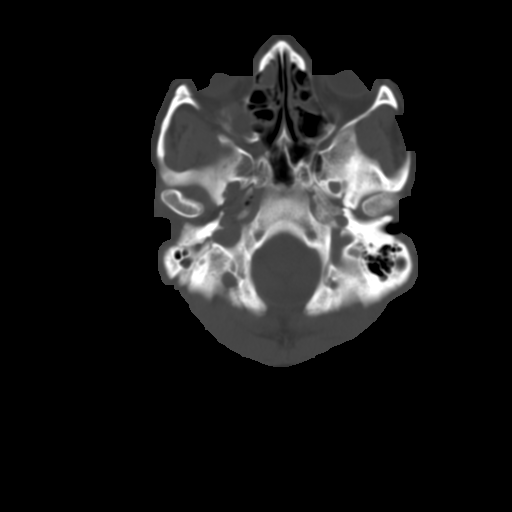
[im 6/36  brain]
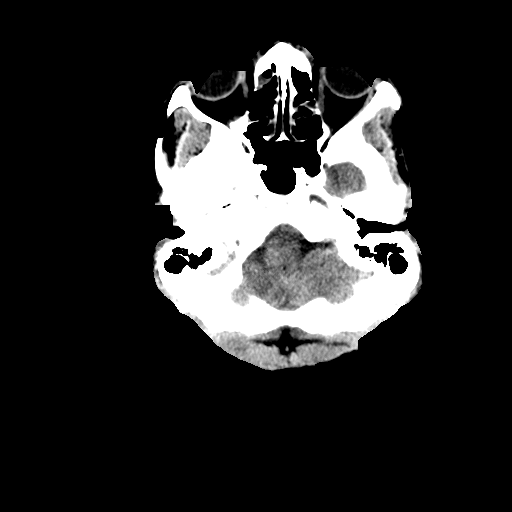
[im 8/36  brain]
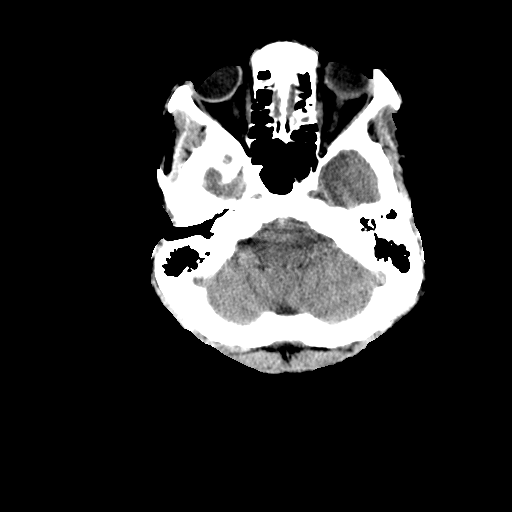
[im 11/36  brain]
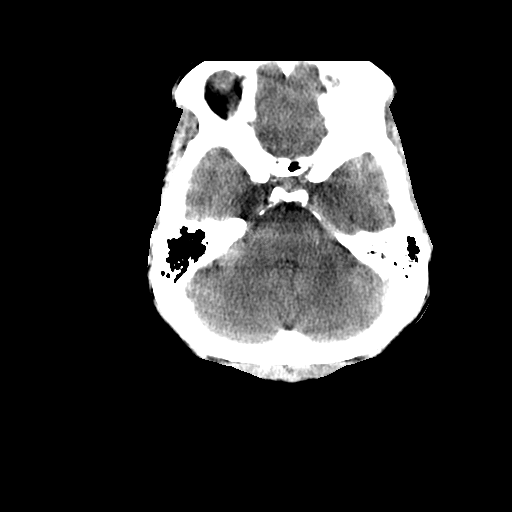
[im 13/36  brain]
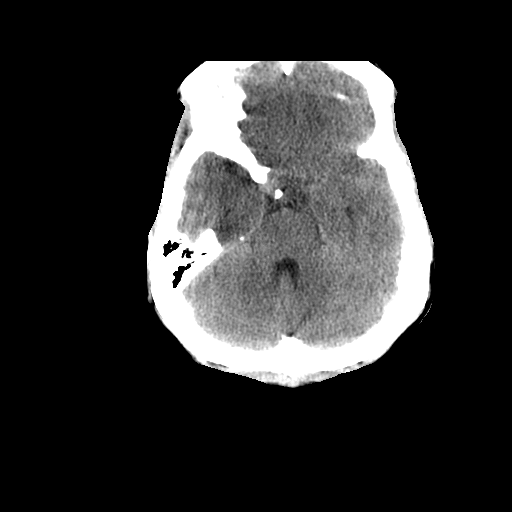
[im 13/36  bone]
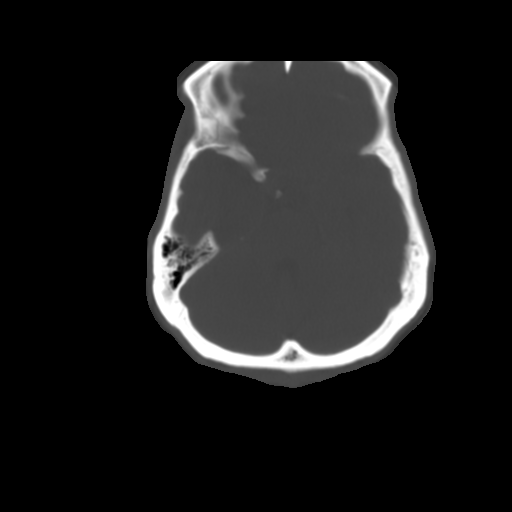
[im 16/36  brain]
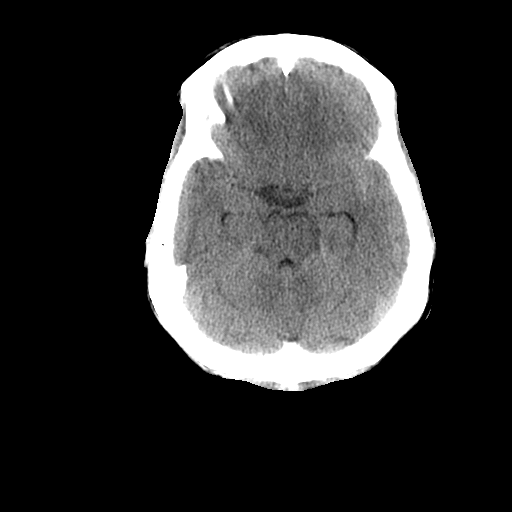
[im 18/36  brain]
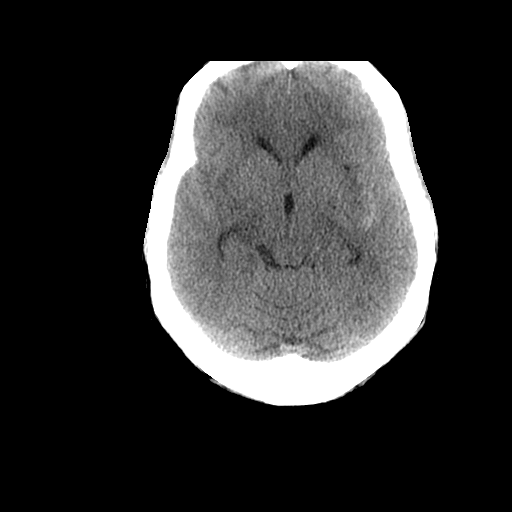
[im 21/36  brain]
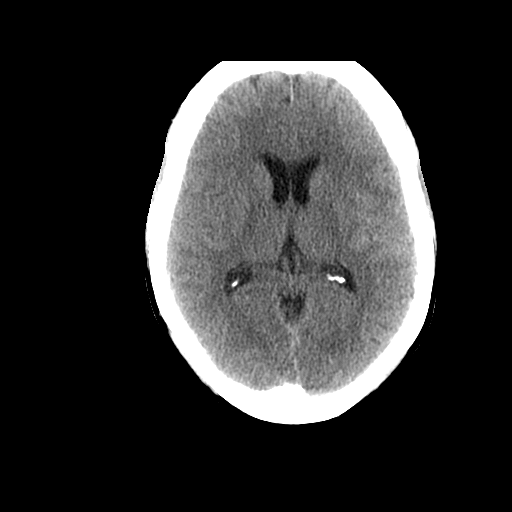
[im 23/36  brain]
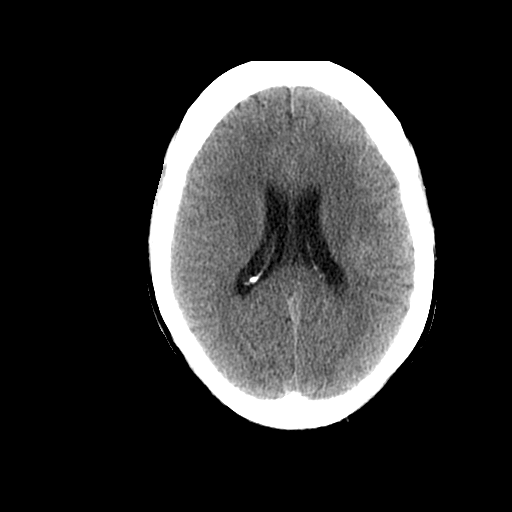
[im 23/36  bone]
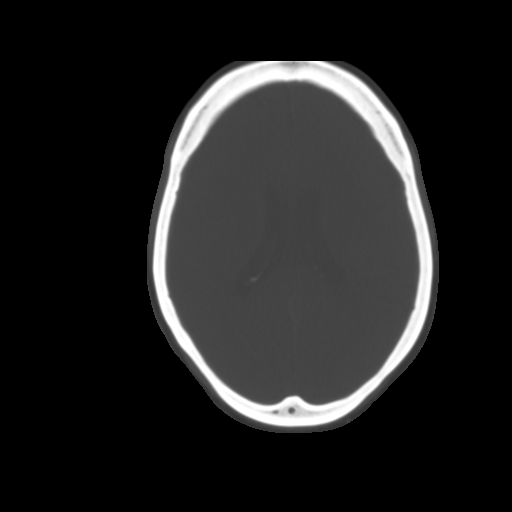
[im 26/36  brain]
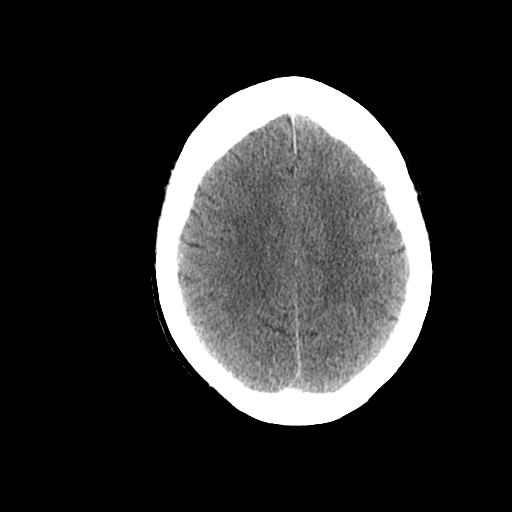
[im 28/36  brain]
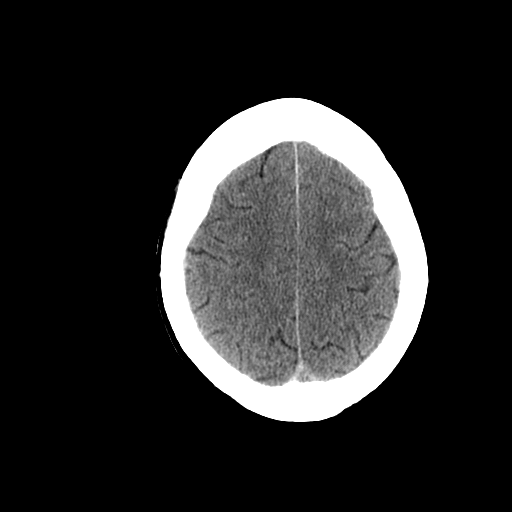
[im 31/36  brain]
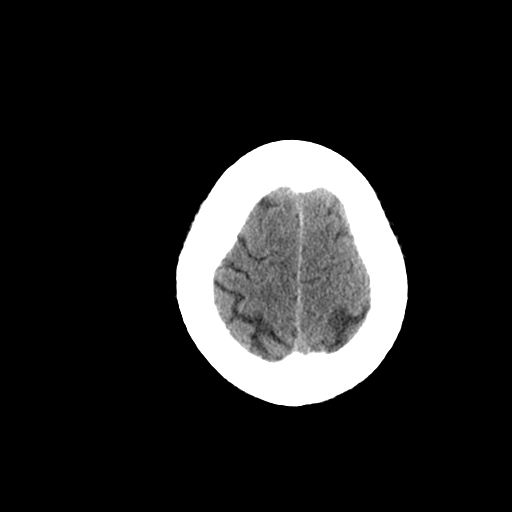
[im 33/36  brain]
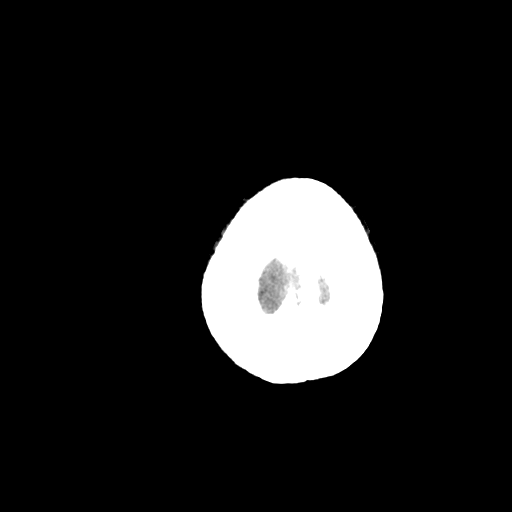
[im 33/36  bone]
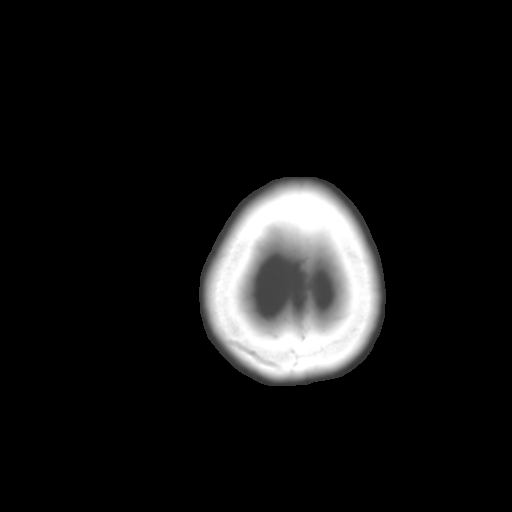

[13 of 30 positions shown; findings below may reference images not displayed]

FINDINGS: There is subtle acute subarachnoid hemorrhage primarily
in the region of the sylvian fissures.  There is no discrete
interventricular hemorrhage.  The temporal horns of the lateral
ventricles are slightly more prominent than would be expected.

The patient has air fluid levels in the sphenoid sinus and partial
opacification of the ethmoid and maxillary sinuses consistent with
sinusitis.  Osseous structures are otherwise normal.
IMPRESSION: Subtle acute subarachnoid hemorrhage.

Critical test results telephoned to Dr. ODILI at the time
of interpretation on [DATE] at [DATE] p.m. by Dr. ODILI.

## 2010-12-12 IMAGING — XA IR ANGIO/EXISTING CATHETER
1 series · 10 of 24 positions shown · IV contrast (IODINE)
Comparison: CT scan of the brain of [DATE].

CLINICAL DATA: Acute onset of headaches nausea vomiting collapse.
Subarachnoid hemorrhage on CT scan of the brain.

BILATERAL CAROTID ARTERIOGRAPHY  and BILATERAL VERTEBRAL ARTERY
angiograms followed by endovascular obliteration of ruptured left
posterior communicating artery region aneurysm.

[Series 300: neuro · 10 of 226 slices shown]
[im 10/226]
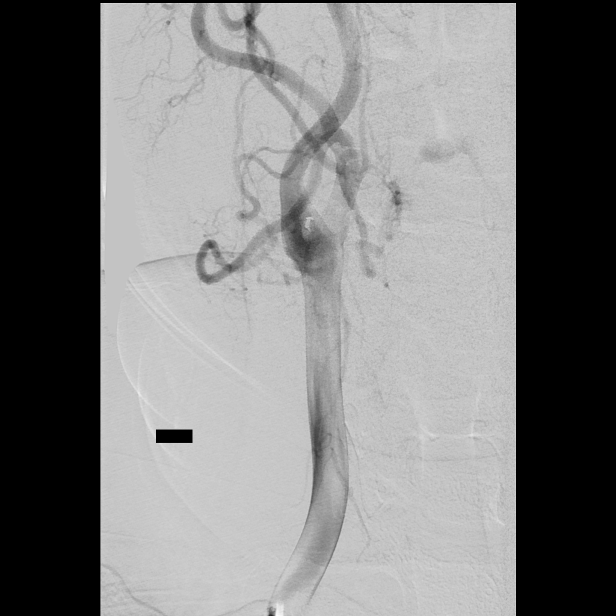
[im 30/226]
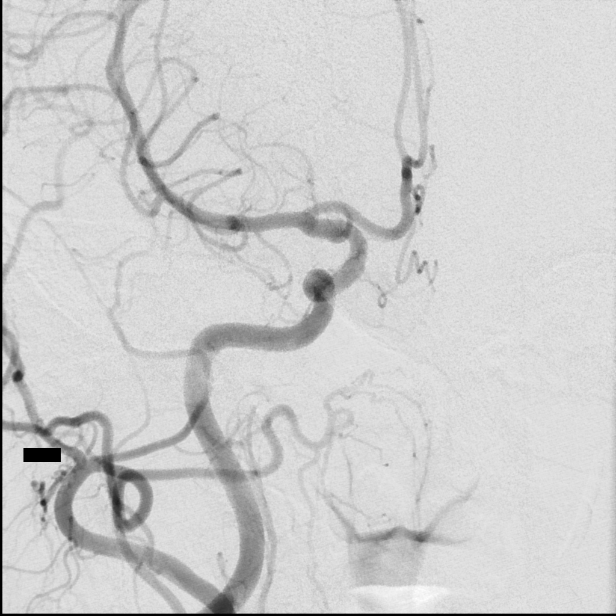
[im 59/226]
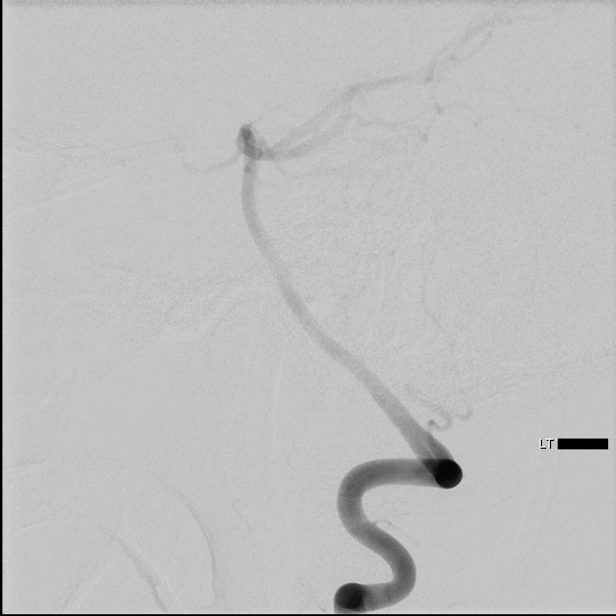
[im 79/226]
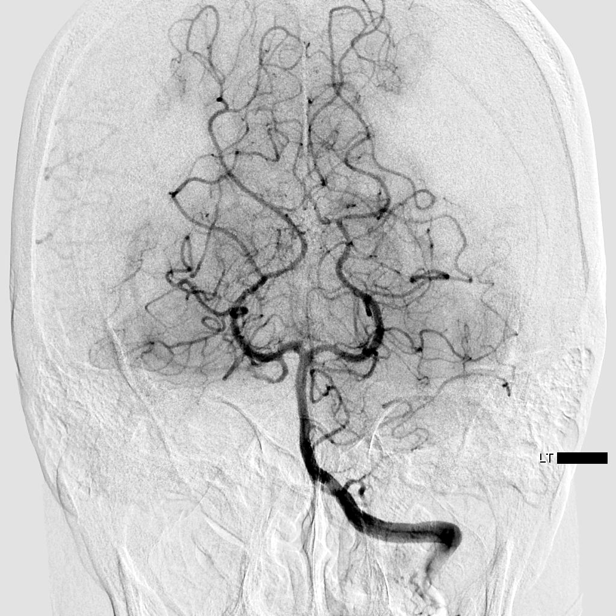
[im 98/226]
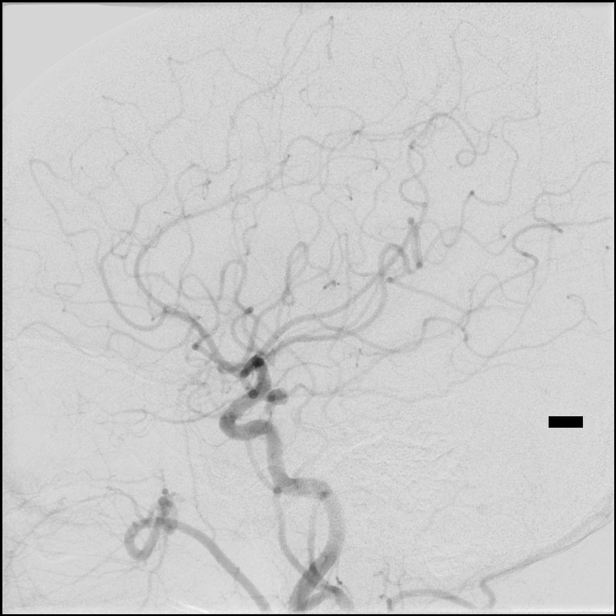
[im 128/226]
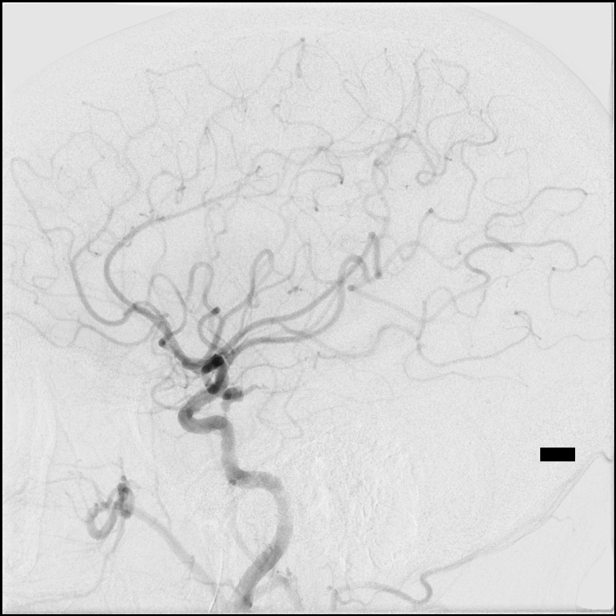
[im 147/226]
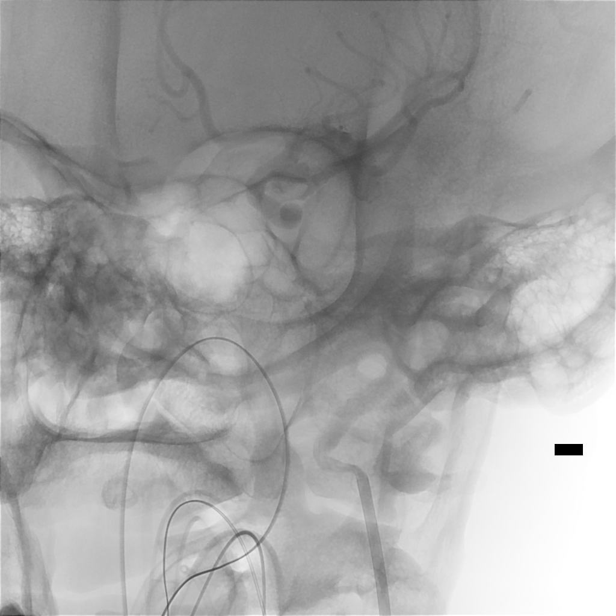
[im 177/226]
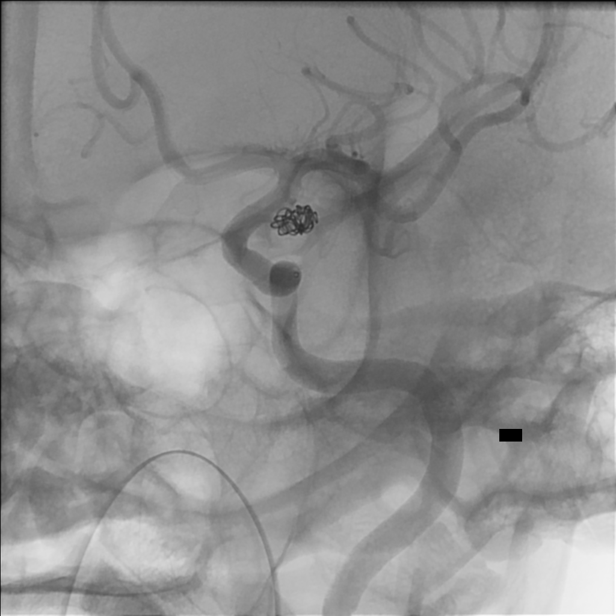
[im 196/226]
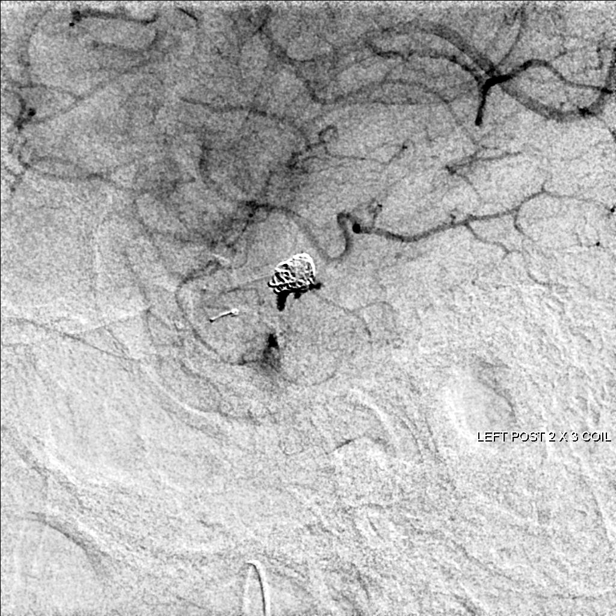
[im 216/226]
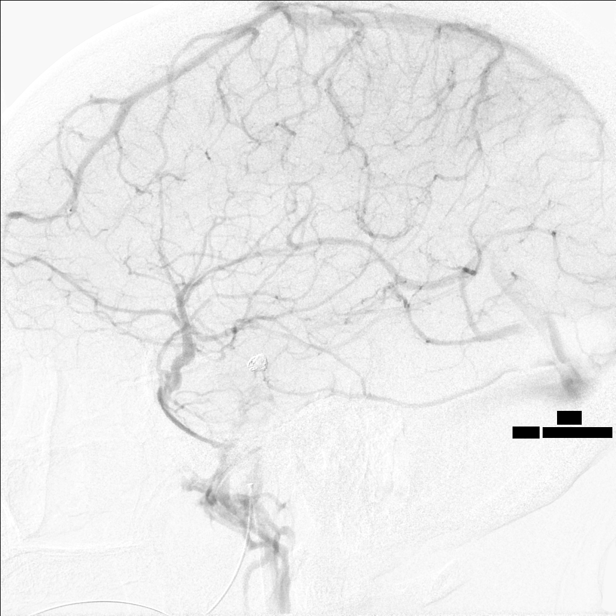

[10 of 24 positions shown; findings below may reference images not displayed]

Following full explanation of procedure along with the potential
cyst complications, an informed witnessed consent was obtained

The right groin was prepped and draped in the usual sterile
fashion.  Thereafter using modified Seldinger technique,
transfemoral access into the right common femoral artery was
obtained without difficulty.  Over a 0.03 French guidewire, 5-
French Pinnacle sheath was inserted.  Through this and also over a
0.03 French guidewire, a 5-French dual catheter was advanced to the
aortic arch region and slight positioned the right common carotid
artery, the right vertebral, the left common carotid artery and the
left vertebral artery

There were no  acute complications.  The patient tolerated
procedure well

Medications utilized Versed 1 mg IV

Fentanyl 25 mcg IV

Benadryl  50 mg IV.  Contrast [3V] approximately 50 ml.
FINDINGS: The right common carotid demonstrates the right external carotid
artery and its major branches to be normal

The right internal carotid at the bulb to the cranial skull base
opacifies normally

The petrous, cavernous and supraclinoid segments are normally
opacified

There is approximately 2 mm small infundibulum at the origin of the
right posterior communicating artery

The right middle and the right anterior cerebral arteries opacify
normally into capillary and venous phases

The right vertebral artery origin is normal.  The vessel is seen to
opacify normally to the cranial skull base.

There is normal opacification of the right posterior inferior
cerebellar artery and the right vertebrobasilar junction.
Unopacified blood is noted distal to this in the basilar artery

The left vertebral artery origin is normal.  This, vertebral artery
which is seen to opacify normally to the cranial skull base

Normal opacification of the left post inferior cerebellar and the
left vertebrobasilar junction is seen

The basilar artery, the posterior cerebral arteries, the superior
cerebellar arteries and the anterior-inferior cerebral arteries
opacify normally into capillary venous phases

Transient opacification via the right posterior communicating
artery of the right middle cerebral distribution is seen

The left common carotid demonstrates the left external carotid
artery and its major branches to be normal.  The left internal
carotid bulb to the cranial skull base is normal.  The petrous,
cavernous and supraclinoid segments are normal

Arising in the region of the left posterior communicating artery is
a bilobed irregular saccular aneurysm  measuring approximately 5. 2
mm x 5.3 mm with a  the neck of approximately 2.5 mm

Distal to this a the left middle and the left anterior cerebral
arteries are seen to opacify normally into capillary and venous
phases

The native left posterior communicating artery is seen arising from
the medial to the neck of the aneurysm.

IMPRESSION.
1.  Approximately 5.2 mm x 5.3 mm narrow neck left posterior
communicating artery region aneurysm.

Endovascular obliteration of the left posterior communicating
artery region irregular aneurysm.

Findings arteriogram were reviewed with the referring neurosurgeon.
It was elected to proceed with endovascular obliteration of the
aneurysm given the morphology.

The procedure, the risks and benefits and alternative were
discussed with the patient's family and the patient.  After
informed consent, the the patient was put under general seizure by
the [facility]ACTED[facility]

The diagnostic JB1  catheter in the left common carotid artery was
exchanged over a 0.035 inches of 300 cm Rosen exchange guidewire
for a 6-French 65 cm neurovascular sheath using biplane roadmap
technique and constant fluoroscopic guidance . Good aspiration was
obtained from the side port on the neurovascular sheath.  A gentle
contrast ejection demonstrated spasm dissections or internal
filling defects

This was then connected to continuous heparinized saline fusion

Over the Rosen exchange guidewire, 6-French 9 cm Brite tip lying
straight guide catheter was then advanced and positioned just
proximal to the left common prep bifurcation.  Guidewire was
removed.  Good aspiration obtained from the hub of the 6-French
guide catheter.

A gentle contrast injection demonstrated no evidence of spasm,
dissections or of intraluminal filling defects.

Using biplane roadmap technique and constant fluoroscopic guidance,
over a 0.035-inch Roadrunner guidewire, the 6-French guide catheter
was then advanced distal vertical segment of the left internal
carotid artery.  Guidewire was removed.  Good aspiration obtained
from the hub of the 6-French guide catheter.  Again a gentle
contrast injection demonstrated no spasm dissections or of
intraluminal filling defects.

A control and performed through 6-French guide catheter
demonstrated no change in the intracranial circulation or of the
aneurysm

At this time in a coaxial manner and with constant heparinized
saline infusion using biplane road technique and constant
fluoroscopic guidance, an accessory SL 10 to tip microcatheter
which had been steam sheath was advanced over 0.014 Softip Synchro
microguidewire to the distal end of the 6-French guide catheter and
the left internal carotid artery

With the microguidewire leading with a J-tip configuration to avoid
dissections or inducing spasm, the combination was navigated using
torque device to the proximal cm cavernous segment of the left
internal carotid artery.  The microguide was then gently advanced
into the aneurysm followed by the microcatheter.  The
microguidewire was then gently retrieved under constant
fluoroscopic guidance ensuring no sudden forward motion movements
of the tip of microcatheter.  None was observed

Good aspiration was obtained from the hub of the intra aneurysmal
microcatheter

A control to perform the 6-French guide catheter demonstrated safe
position of the tip of microcatheter for embolization to begin.
The first coil utilized was a a 3.5 mm x 6.6 mm MicruSphere
Cerecyte coil.  This was advanced in a coaxial manner and with
constant heparinized infusion using biplane roadmap technique and
constant fluoroscopic guidance to the distal tip of the entrance
the microcatheter.  This coil was then advanced into the aneurysm
with nice basket configuration being obtained.  A control
arteriogram performed through the 6-French guide catheter and the
left internal carotid artery demonstrate excellent apposition of
the coil across neck of the aneurysm.  This was then detached
without difficulty.  This then followed by a 3 mm x 4 cm DeltaPaq
Cerecyte coil, a 2.5 mm x 3.3 cm MicruSphere Cerecyte coil, a
mm x 4 cm DeltaPlush Cerecyte coil, a 2 mm x 3 cm DeltaPlush
Cerecyte coil, advancement of a additional coil was met with no
significant resistance and backing out of the microcatheter at the
neck.  This coil was therefore not deployed.

A control and performed through the 6-French guide demonstrated
complete obliteration of the left PICA region aneurysm.  The onset
of the microcatheter were seen to be a neck of the aneurysm

Microcatheter was then gently retrieved without any movement of the
coil mass.

Final control to perform the 6-French guide demonstrate no evidence
of intraluminal filling defects.  Complete obliteration of the left
PICA region aneurysm was maintained

The 6-French guide catheter and the 610 neurovascular sheath then
retrieved and abdominal aorta exchanged over a J-tip guidewire for
E 6-French Pinnacle sheath.  This was then exchanged for a common
femoral arterial closure device for hemostasis in the right groin.

No acute changes were seen in the patient's blood pressure or
neurological status.

She was then transferred to the CT scan suite for postprocedural CT
scan brain.

Impression
1. Status post endovascular complete  coil obliteration of ruptured
left posterior communicating artery region aneurysm as described
above.

## 2010-12-12 MED ORDER — IOHEXOL 300 MG/ML  SOLN: 300.0000 mL | Freq: Once | INTRAMUSCULAR | Status: AC | PRN

## 2010-12-13 ENCOUNTER — Inpatient Hospital Stay (HOSPITAL_COMMUNITY): Payer: BC Managed Care – PPO

## 2010-12-13 ENCOUNTER — Emergency Department (HOSPITAL_COMMUNITY): Payer: BC Managed Care – PPO

## 2010-12-13 DIAGNOSIS — I609 Nontraumatic subarachnoid hemorrhage, unspecified: Secondary | ICD-10-CM

## 2010-12-13 DIAGNOSIS — J9600 Acute respiratory failure, unspecified whether with hypoxia or hypercapnia: Secondary | ICD-10-CM

## 2010-12-13 LAB — BLOOD GAS, ARTERIAL
FIO2: 0.4 %
MECHVT: 550 mL
O2 Saturation: 99.4 %
PEEP: 5 cmH2O
Patient temperature: 97.2
RATE: 12 resp/min
pO2, Arterial: 182 mmHg — ABNORMAL HIGH (ref 80.0–100.0)

## 2010-12-13 LAB — BASIC METABOLIC PANEL
BUN: 22 mg/dL (ref 6–23)
CO2: 20 mEq/L (ref 19–32)
Calcium: 8.6 mg/dL (ref 8.4–10.5)
Creatinine, Ser: 1.43 mg/dL — ABNORMAL HIGH (ref 0.4–1.2)
GFR calc non Af Amer: 40 mL/min — ABNORMAL LOW (ref >60)
Glucose, Bld: 226 mg/dL — ABNORMAL HIGH (ref 70–99)
Sodium: 138 mEq/L (ref 135–145)

## 2010-12-13 LAB — CBC
MCH: 27.8 pg (ref 26.0–34.0)
MCHC: 33.9 g/dL (ref 30.0–36.0)
Platelets: 246 10*3/uL (ref 150–400)
RDW: 13.6 % (ref 11.5–15.5)

## 2010-12-13 LAB — MRSA PCR SCREENING: MRSA by PCR: NEGATIVE

## 2010-12-13 LAB — GLUCOSE, CAPILLARY
Glucose-Capillary: 218 mg/dL — ABNORMAL HIGH (ref 70–99)
Glucose-Capillary: 187 mg/dL — ABNORMAL HIGH (ref 70–99)
Glucose-Capillary: 286 mg/dL — ABNORMAL HIGH (ref 70–99)

## 2010-12-13 LAB — HEMOGLOBIN A1C: Hgb A1c MFr Bld: 6.9 % — ABNORMAL HIGH (ref <5.7)

## 2010-12-13 IMAGING — CR DG CHEST 1V PORT
1 series · 1 of 1 positions shown · non-contrast
Comparison: [DATE]

CLINICAL DATA: Evaluate ET tube placement

PORTABLE CHEST - 1 VIEW

[view not recorded]
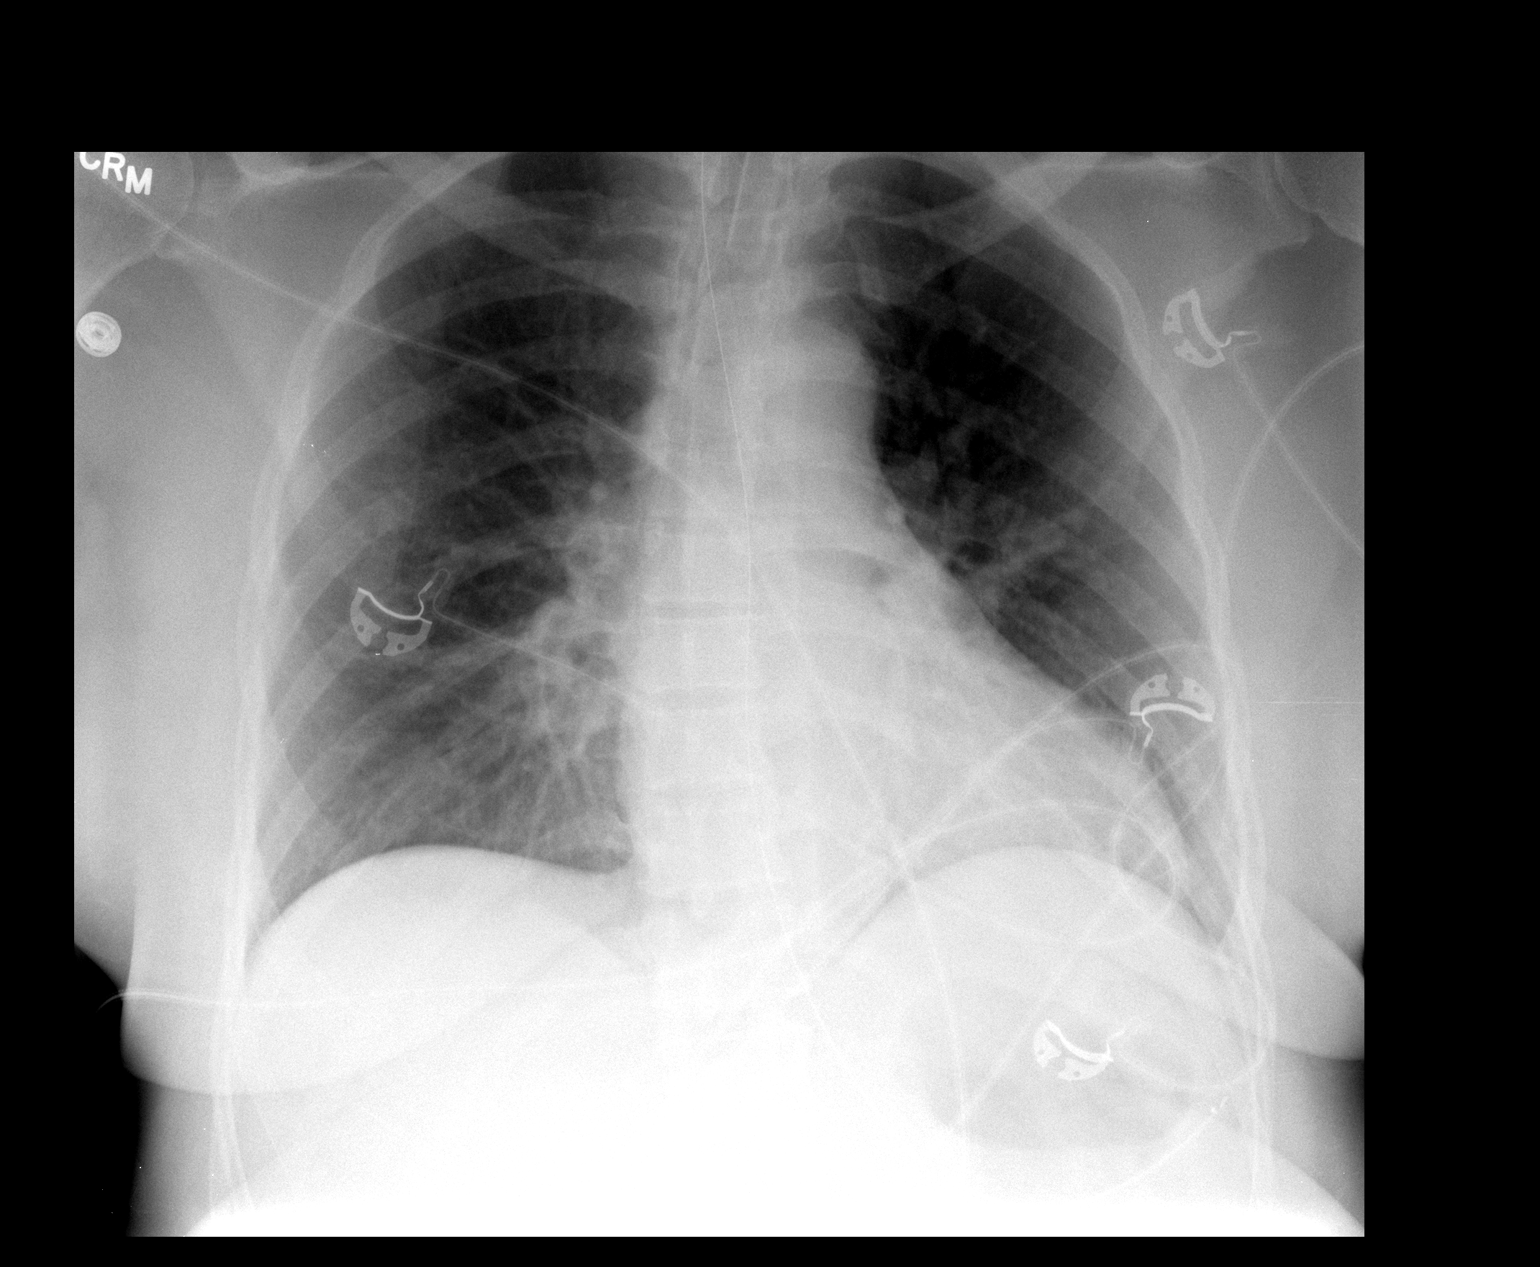

[1 of 1 positions shown; findings below may reference images not displayed]

FINDINGS: ET tube tip is above the carina.

There is a nasogastric tube with tip in the stomach.

No pleural effusion or pulmonary edema identified.

There is no airspace consolidation noted.
IMPRESSION: 1.  ET tube tip in satisfactory position above the carina.

## 2010-12-13 IMAGING — CT CT HEAD W/O CM
1 of 2 series · 13 of 30 positions shown, 17 images · non-contrast
Comparison: [DATE]

CLINICAL DATA: Subarachnoid hemorrhage.  Post I R procedure.

CT HEAD WITHOUT CONTRAST
TECHNIQUE: Contiguous axial images were obtained from the base of
the skull through the vertex without contrast.

[Series 2: brain · axial · 0.47mm/px · z∈[+141,+273]mm · 13 of 32 slices shown, 17 images]
[im 3/32  brain]
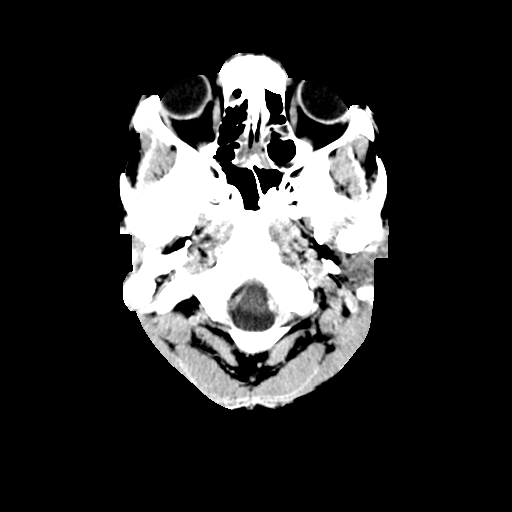
[im 3/32  bone]
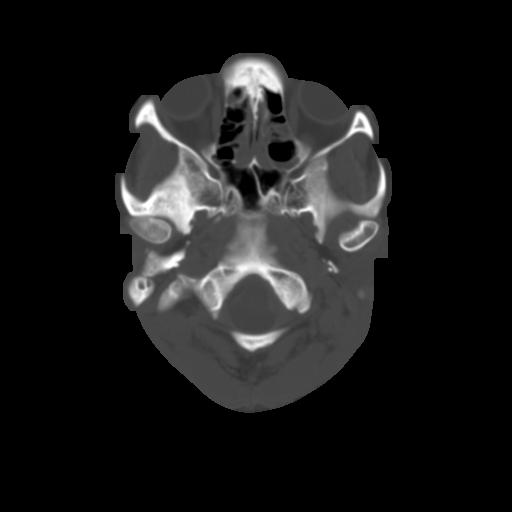
[im 5/32  brain]
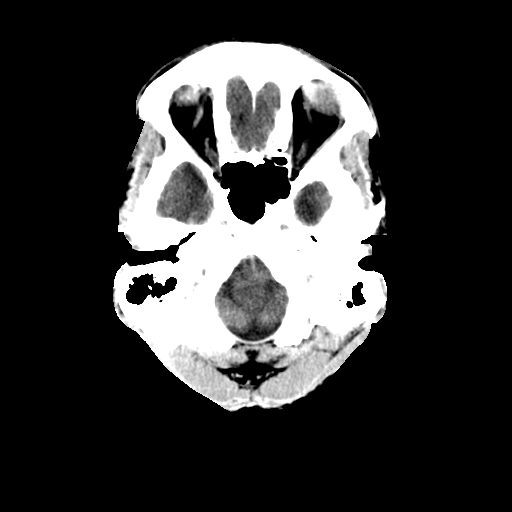
[im 7/32  brain]
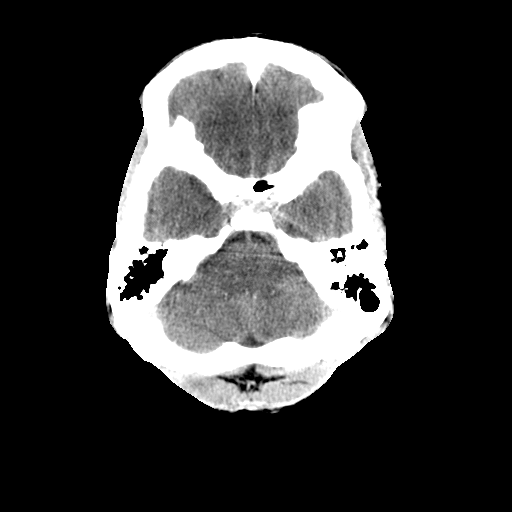
[im 9/32  brain]
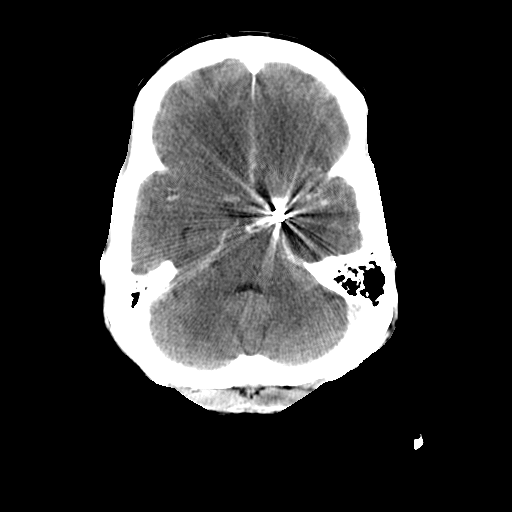
[im 12/32  brain]
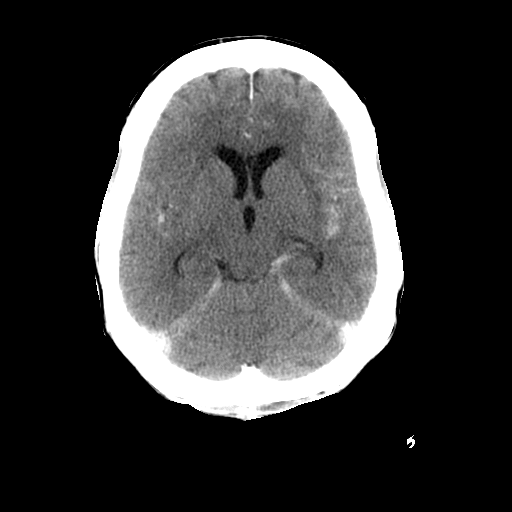
[im 12/32  bone]
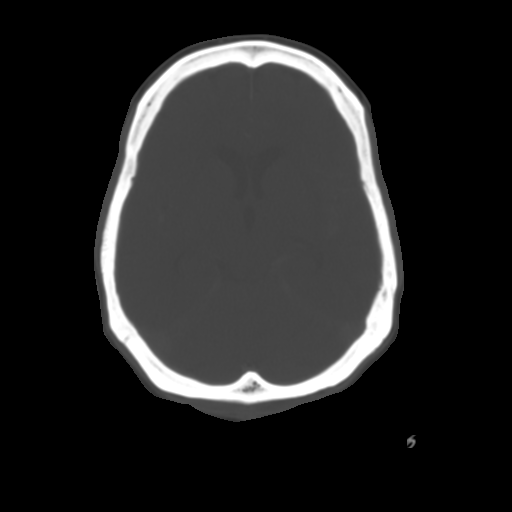
[im 14/32  brain]
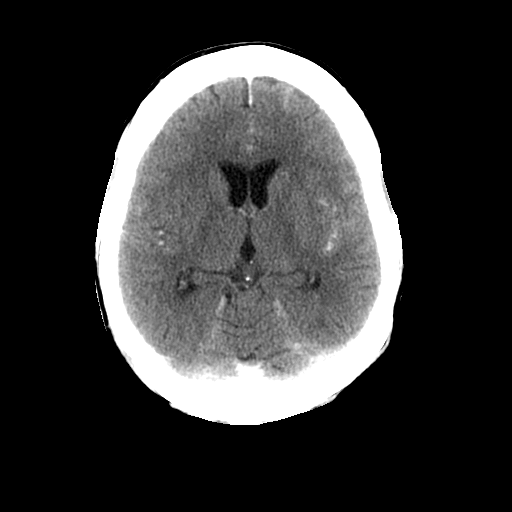
[im 16/32  brain]
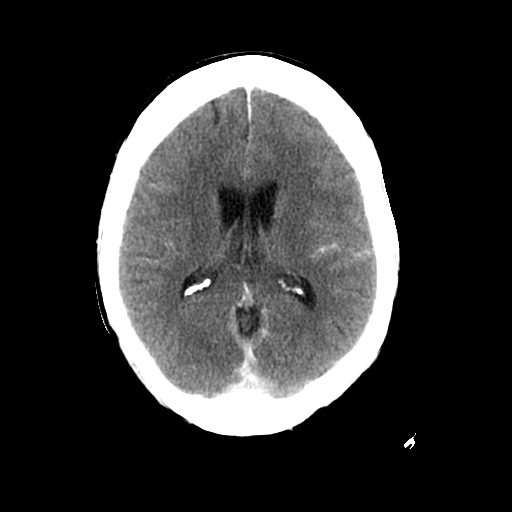
[im 18/32  brain]
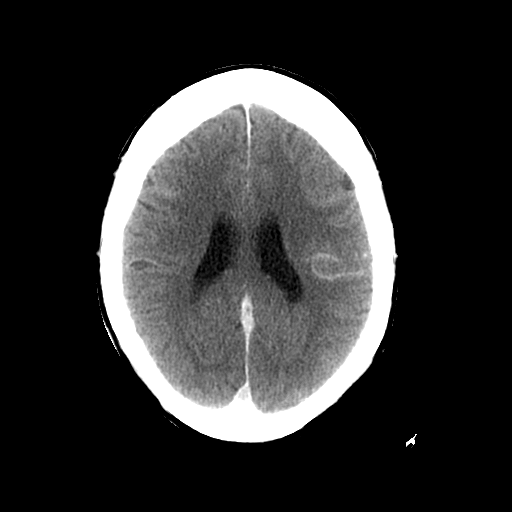
[im 20/32  brain]
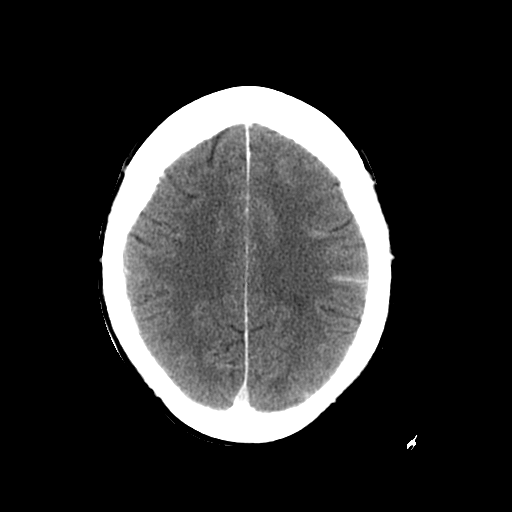
[im 20/32  bone]
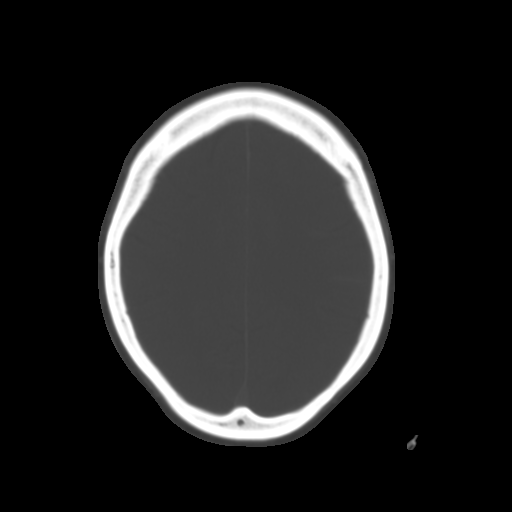
[im 23/32  brain]
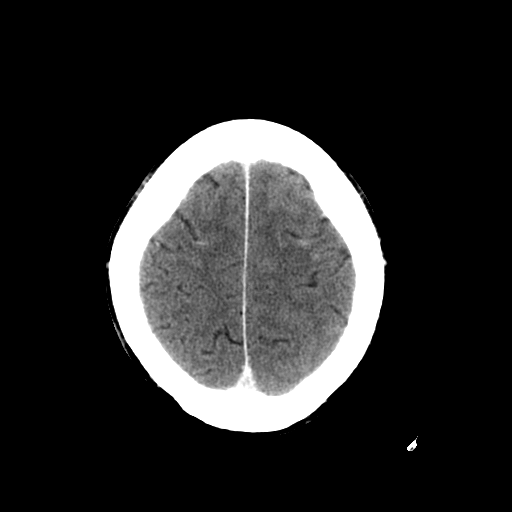
[im 25/32  brain]
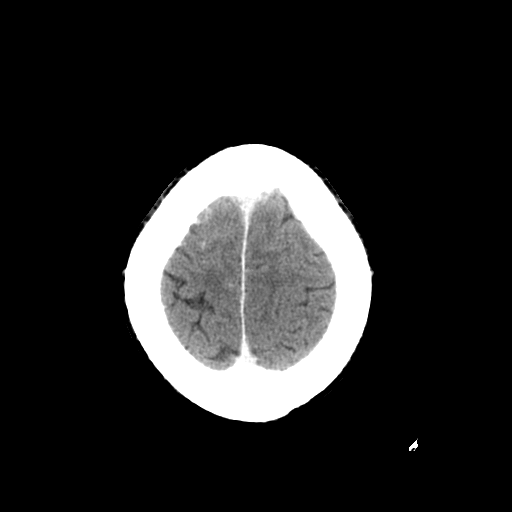
[im 27/32  brain]
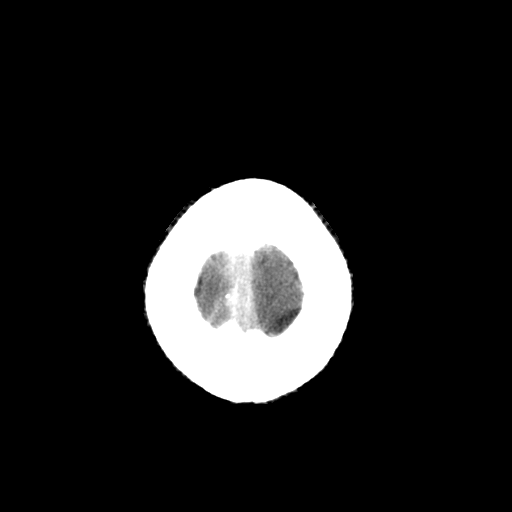
[im 29/32  brain]
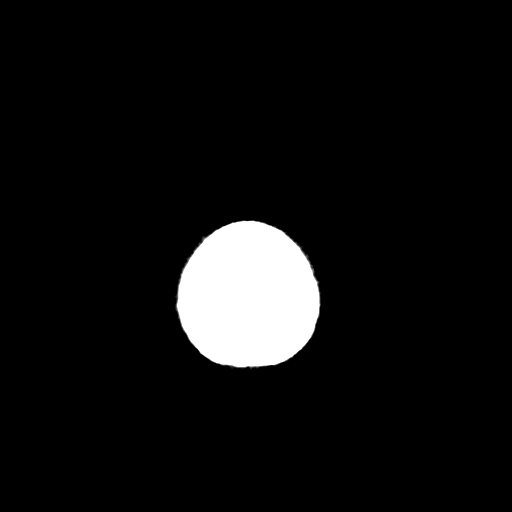
[im 29/32  bone]
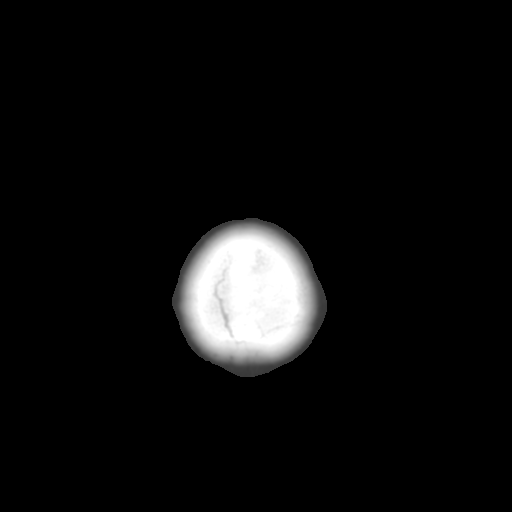

[13 of 30 positions shown; findings below may reference images not displayed]

FINDINGS: Since the previous study, metallic foreign bodies
consistent with coils have been placed in the central left middle
cranial fossa. There is interval increase in the subarachnoid
hemorrhage, now seen in the parafalcine, sylvian fissure, and
basilar subarachnoid spaces.   No definite intraventricular
hemorrhage and no ventricular dilatation.  No significant cerebral
edema.  Gray-white matter junctions remain distinct.  Opacification
and air fluid levels seen in the maxillary antra, ethmoid air
cells, and sphenoid sinuses.  Hypoaeration of the frontal sinuses.
IMPRESSION: Interval placement of coils in the left internal carotid
distribution.  Interval increase in the subarachnoid hemorrhage.
No evidence of significant cerebral edema, intraventricular
hemorrhage, or mass effect.

Critical test results telephoned to the patient's nurse, AURY at
the time of interpretation on [DATE] at [HO] hours.

## 2010-12-13 MED ORDER — IOHEXOL 300 MG/ML  SOLN
400.0000 mL | Freq: Once | INTRAMUSCULAR | Status: AC | PRN
  Administered 2010-12-13: 150 mL via INTRAVENOUS

## 2010-12-14 LAB — GLUCOSE, CAPILLARY
Glucose-Capillary: 179 mg/dL — ABNORMAL HIGH (ref 70–99)
Glucose-Capillary: 180 mg/dL — ABNORMAL HIGH (ref 70–99)
Glucose-Capillary: 160 mg/dL — ABNORMAL HIGH (ref 70–99)

## 2010-12-14 MED ORDER — IOHEXOL 300 MG/ML  SOLN
400.0000 mL | Freq: Once | INTRAMUSCULAR | Status: AC | PRN
  Administered 2010-12-13: 150 mL via INTRA_ARTERIAL

## 2010-12-15 LAB — GLUCOSE, CAPILLARY
Glucose-Capillary: 122 mg/dL — ABNORMAL HIGH (ref 70–99)
Glucose-Capillary: 212 mg/dL — ABNORMAL HIGH (ref 70–99)

## 2010-12-16 LAB — GLUCOSE, CAPILLARY
Glucose-Capillary: 146 mg/dL — ABNORMAL HIGH (ref 70–99)
Glucose-Capillary: 133 mg/dL — ABNORMAL HIGH (ref 70–99)
Glucose-Capillary: 228 mg/dL — ABNORMAL HIGH (ref 70–99)
Glucose-Capillary: 195 mg/dL — ABNORMAL HIGH (ref 70–99)

## 2010-12-17 LAB — GLUCOSE, CAPILLARY
Glucose-Capillary: 232 mg/dL — ABNORMAL HIGH (ref 70–99)
Glucose-Capillary: 209 mg/dL — ABNORMAL HIGH (ref 70–99)

## 2010-12-18 LAB — GLUCOSE, CAPILLARY
Glucose-Capillary: 107 mg/dL — ABNORMAL HIGH (ref 70–99)
Glucose-Capillary: 191 mg/dL — ABNORMAL HIGH (ref 70–99)

## 2010-12-19 LAB — GLUCOSE, CAPILLARY
Glucose-Capillary: 154 mg/dL — ABNORMAL HIGH (ref 70–99)
Glucose-Capillary: 264 mg/dL — ABNORMAL HIGH (ref 70–99)
Glucose-Capillary: 249 mg/dL — ABNORMAL HIGH (ref 70–99)

## 2010-12-22 ENCOUNTER — Emergency Department (HOSPITAL_COMMUNITY)
Admission: EM | Admit: 2010-12-22 | Discharge: 2010-12-22 | Disposition: A | Discharge status: Home / Self Care | Payer: BC Managed Care – PPO | Attending: Emergency Medicine | Admitting: Emergency Medicine

## 2010-12-22 ENCOUNTER — Emergency Department (HOSPITAL_COMMUNITY): Payer: BC Managed Care – PPO

## 2010-12-22 ENCOUNTER — Encounter (HOSPITAL_COMMUNITY): Payer: Self-pay | Admitting: Radiology

## 2010-12-22 DIAGNOSIS — E119 Type 2 diabetes mellitus without complications: Secondary | ICD-10-CM | POA: Insufficient documentation

## 2010-12-22 DIAGNOSIS — R51 Headache: Secondary | ICD-10-CM | POA: Insufficient documentation

## 2010-12-22 DIAGNOSIS — M79609 Pain in unspecified limb: Secondary | ICD-10-CM | POA: Insufficient documentation

## 2010-12-22 DIAGNOSIS — Z79899 Other long term (current) drug therapy: Secondary | ICD-10-CM | POA: Insufficient documentation

## 2010-12-22 DIAGNOSIS — G8918 Other acute postprocedural pain: Secondary | ICD-10-CM | POA: Insufficient documentation

## 2010-12-22 DIAGNOSIS — I10 Essential (primary) hypertension: Secondary | ICD-10-CM | POA: Insufficient documentation

## 2010-12-22 HISTORY — DX: Essential (primary) hypertension: I10

## 2010-12-22 HISTORY — DX: Cerebral aneurysm, nonruptured: I67.1

## 2010-12-22 LAB — DIFFERENTIAL
Basophils Absolute: 0 10*3/uL (ref 0.0–0.1)
Basophils Relative: 0 % (ref 0–1)
Lymphocytes Relative: 29 % (ref 12–46)
Monocytes Absolute: 0.9 10*3/uL (ref 0.1–1.0)
Monocytes Relative: 10 % (ref 3–12)
Neutro Abs: 5 10*3/uL (ref 1.7–7.7)
Neutrophils Relative %: 59 % (ref 43–77)

## 2010-12-22 LAB — CBC
HCT: 29.6 % — ABNORMAL LOW (ref 36.0–46.0)
Hemoglobin: 10.4 g/dL — ABNORMAL LOW (ref 12.0–15.0)
MCH: 28.7 pg (ref 26.0–34.0)
MCHC: 35.1 g/dL (ref 30.0–36.0)
RBC: 3.62 MIL/uL — ABNORMAL LOW (ref 3.87–5.11)

## 2010-12-22 LAB — POCT I-STAT, CHEM 8
BUN: 15 mg/dL (ref 6–23)
Creatinine, Ser: 1.4 mg/dL — ABNORMAL HIGH (ref 0.4–1.2)
Glucose, Bld: 106 mg/dL — ABNORMAL HIGH (ref 70–99)
Hemoglobin: 10.5 g/dL — ABNORMAL LOW (ref 12.0–15.0)
Potassium: 3.6 mEq/L (ref 3.5–5.1)
Sodium: 137 mEq/L (ref 135–145)

## 2010-12-22 LAB — PROTIME-INR: INR: 1.04 (ref 0.00–1.49)

## 2010-12-22 LAB — APTT: aPTT: 22 seconds — ABNORMAL LOW (ref 24–37)

## 2010-12-22 IMAGING — CT CT HEAD W/O CM
1 of 2 series · 13 of 30 positions shown, 17 images · non-contrast
Comparison: CT of the head performed [DATE]

CLINICAL DATA: Severe headache and nausea; recent aneurysm coiling.

CT HEAD WITHOUT CONTRAST
TECHNIQUE: Contiguous axial images were obtained from the base of
the skull through the vertex without contrast.

[Series 2: brain · axial · 0.47mm/px · z∈[+98,+218]mm · 13 of 28 slices shown, 17 images]
[im 2/28  brain]
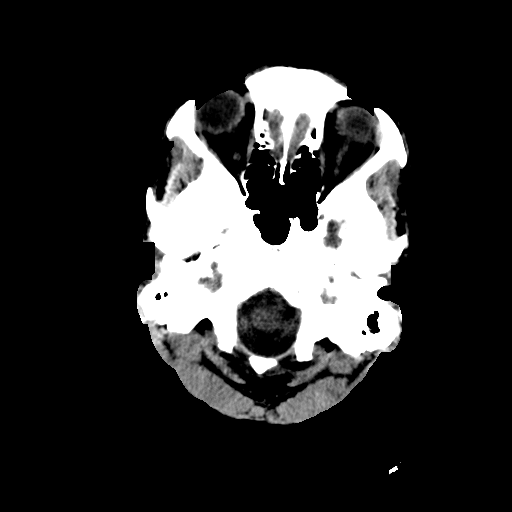
[im 2/28  bone]
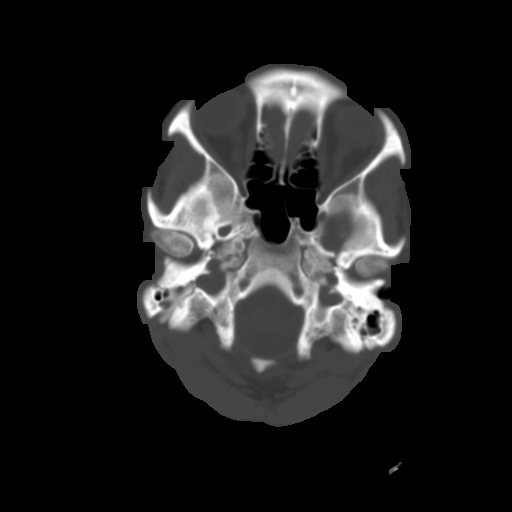
[im 4/28  brain]
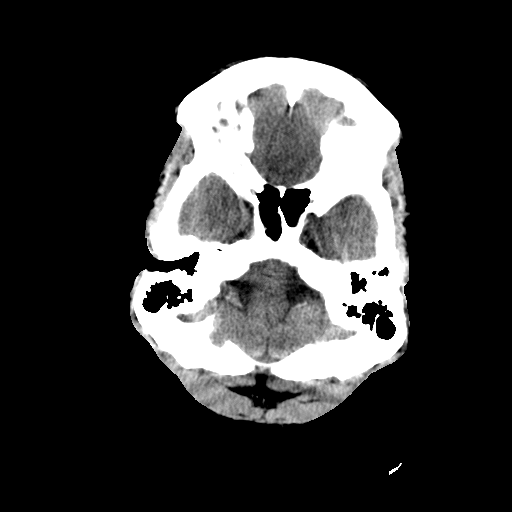
[im 6/28  brain]
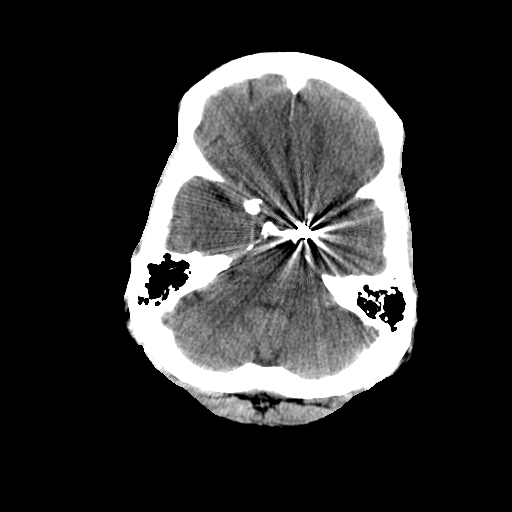
[im 8/28  brain]
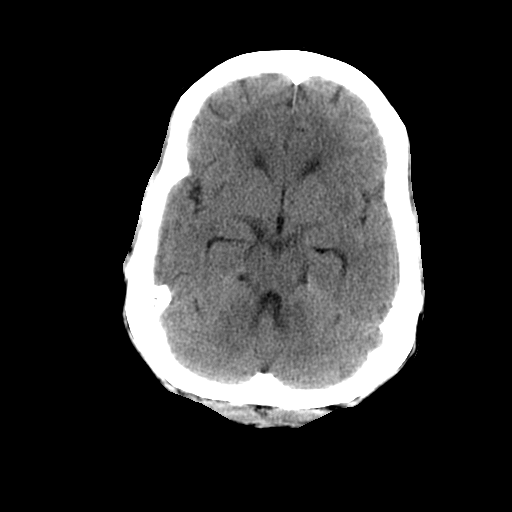
[im 10/28  brain]
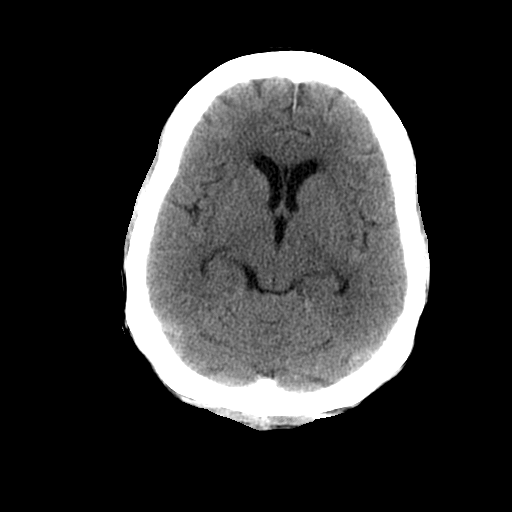
[im 10/28  bone]
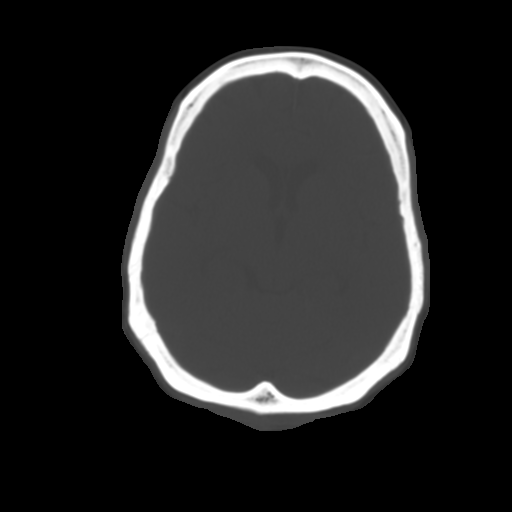
[im 12/28  brain]
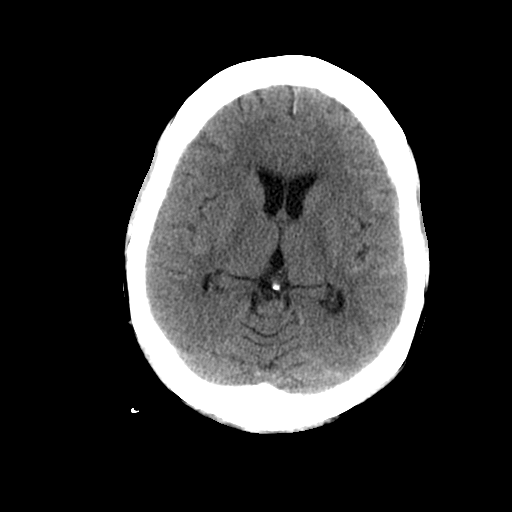
[im 14/28  brain]
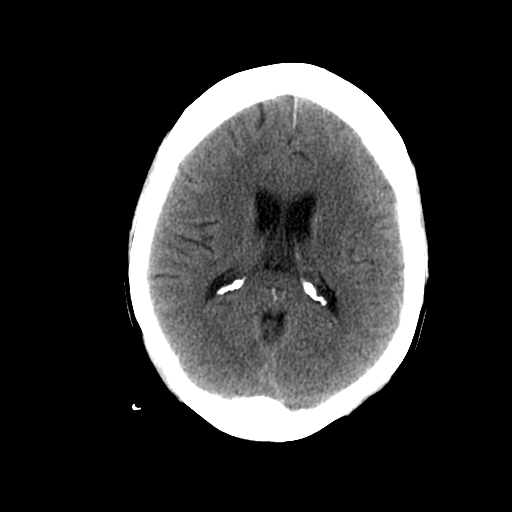
[im 16/28  brain]
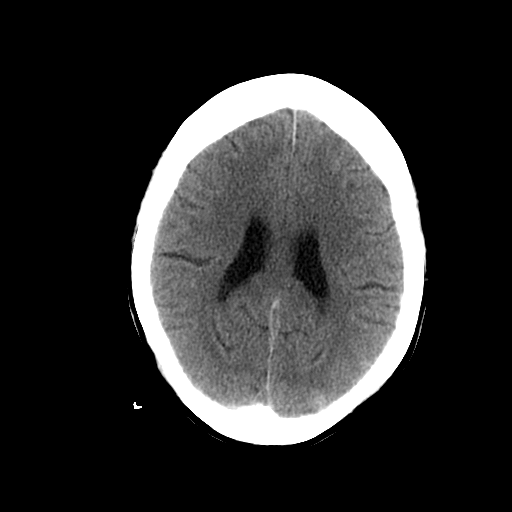
[im 18/28  brain]
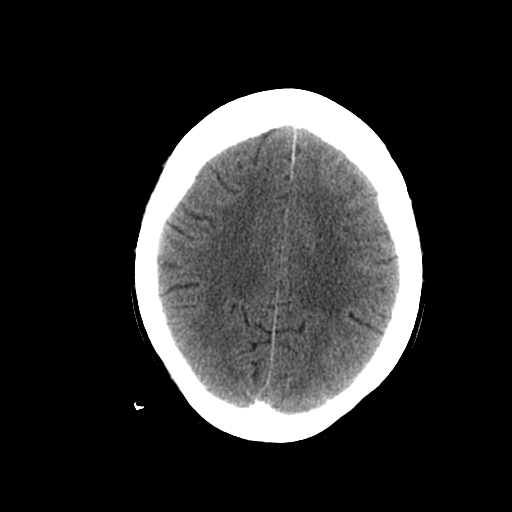
[im 18/28  bone]
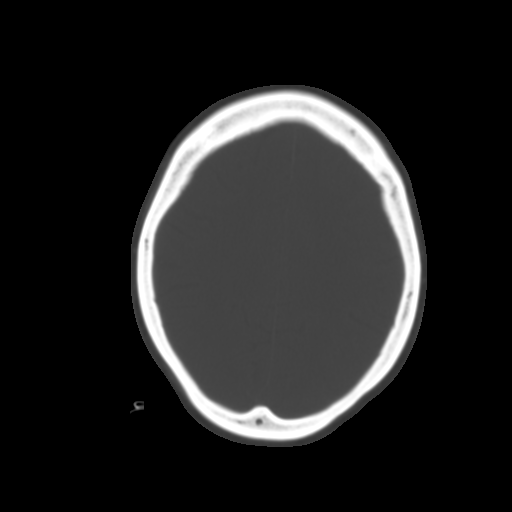
[im 20/28  brain]
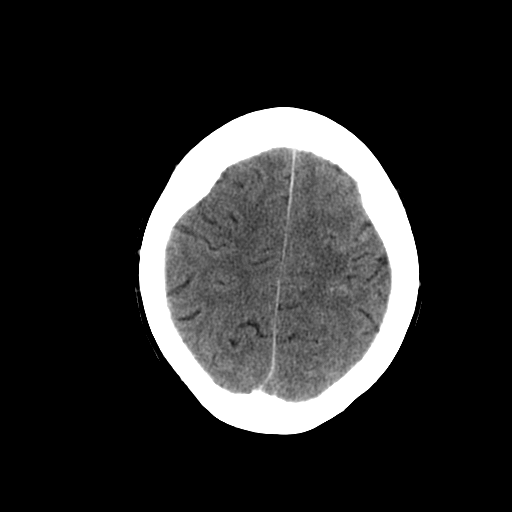
[im 22/28  brain]
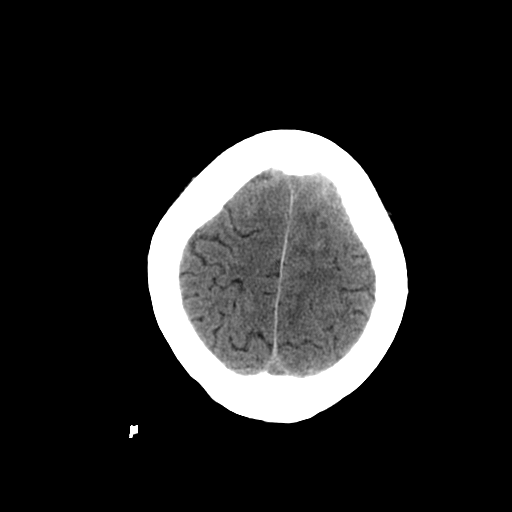
[im 24/28  brain]
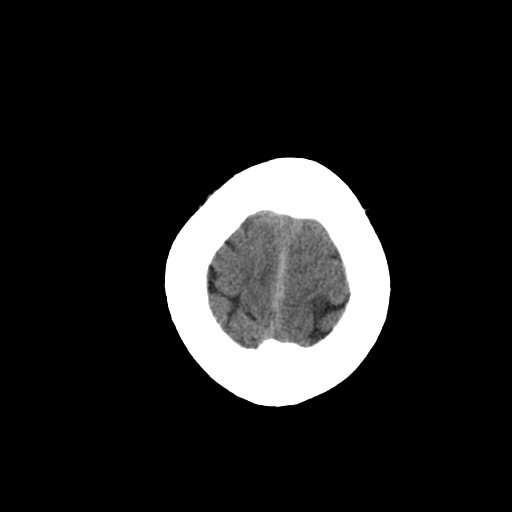
[im 26/28  brain]
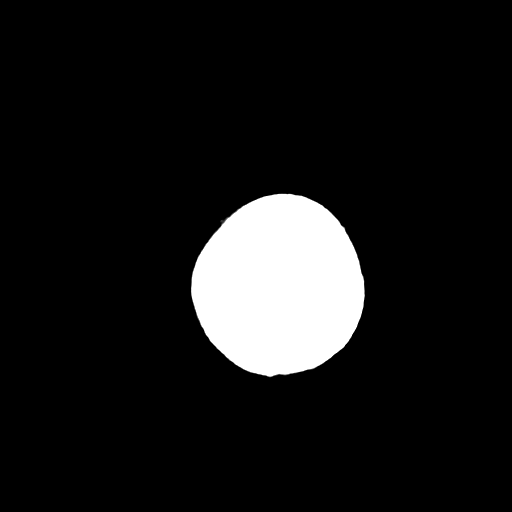
[im 26/28  bone]
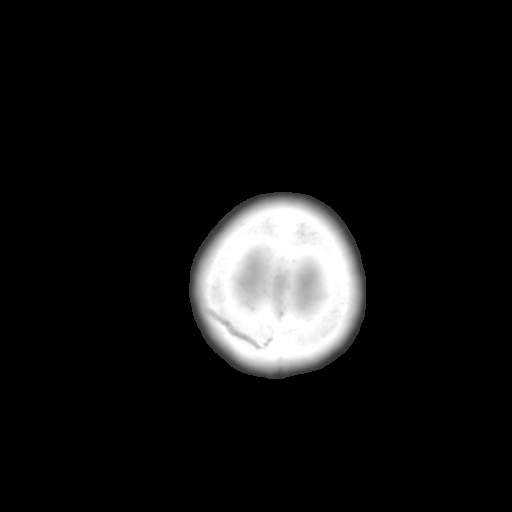

[13 of 30 positions shown; findings below may reference images not displayed]

FINDINGS: A small amount of residual subarachnoid blood is noted
along the left parietal lobe near the vertex, and at the left
Sylvian fissure.  Subarachnoid blood has otherwise largely
resolved.

The patient's aneurysm coiling at the left side of the circle of
Willis is unchanged in appearance.  There is no evidence of new
intra- or extra-axial hemorrhage.  No mass lesion is seen; there is
no evidence of acute infarction.

The posterior fossa, including the cerebellum, brainstem and fourth
ventricle, is within normal limits.  The third and lateral
ventricles, and basal ganglia are unremarkable in appearance.  The
cerebral hemispheres demonstrate normal gray-white differentiation.
No mass effect or midline shift is seen.

There is no evidence of fracture; visualized osseous structures are
unremarkable in appearance.  The visualized portions of the orbits
are within normal limits.  The paranasal sinuses and mastoid air
cells are well-aerated.  No significant soft tissue abnormalities
are seen.
IMPRESSION: 1.  Small amount of residual subarachnoid blood noted along the
left parietal lobe near the vertex, and at the left Sylvian
fissure; this is significantly improved from the prior study.
2.  No evidence of new bleed or infarction.  Aneurysm coiling is
stable in appearance.

## 2010-12-26 NOTE — H&P (Signed)
NAMEMERIDYTH, SCHADE                  ACCOUNT NO.:  [id number]  MEDICAL RECORD NO.:  [id number]           PATIENT TYPE:  E  LOCATION:  [facility]                         FACILITY:  [facility]  PHYSICIAN:  Eustace Moore, MD     DATE OF BIRTH:  07/07/1966  DATE OF ADMISSION:  12/12/2010 DATE OF DISCHARGE:                             HISTORY & PHYSICAL   ADMITTING DIAGNOSIS:  Subarachnoid hemorrhage.  HISTORY OF PRESENT ILLNESS:  Ms. Seavers is a very pleasant 45 year old African-American female, who was in her usual state of health until December 11, 2010, when she had an apparent syncopal episode while in the shower.  She remembers no headache, chest pain, palpitations, dizziness, visual changes, numbness, tingling, or weakness prior to the event while standing in the shower.  She has amnesia before the event, she remembers waking into the shower and has had a "terrible headache" since that time.  She presented to her family physician, who ordered a CT scan of the head, which suggested some subarachnoid hemorrhage in the sylvian fissure and she was sent to the emergency department where Neurosurgical evaluation was requested.  The patient complains of severe headache. She denies any visual changes.  She denies any numbness, tingling, or weakness.  She has nausea.  She has had no fevers or chills or recent illnesses other than a sinus infection, last week got a Z-PAK to treat. The family reports a decrease in the strength of her voice over the last few months.  PAST MEDICAL HISTORY:  Type 2 diabetes mellitus, hypertension, and bilateral tubal ligation.  MEDICATIONS:  Lisinopril, metformin, insulin, and she states there are two other medications, which she cannot recall the names of the medications.  ALLERGIES:  She denies.  SOCIAL HISTORY:  Denies use of tobacco products.  No drug use.  She has occasional social alcohol.  FAMILY HISTORY:  No history of aneurysm.  PHYSICAL EXAM:  VITAL SIGNS:   Temperature 98.6, heart rate 89, respirations 16, blood pressure 156/73. GENERAL:  Cooperative female, lying in stretcher.  She is not somnolent or lethargic, but does keep her eyes closed with her hand on her forehead. HEENT:  No evidence of external trauma.  Extraocular movements are intact.  Her vision is okay to non formal confrontation.  No facial asymmetry.  She can stick out her tongue. NECK:  She has nuchal rigidity, but is nontender. HEART:  Regular rate and rhythm. EXTREMITIES:  No obvious deformities. NEUROLOGICAL:  She again is awake and interactive and conversive.  She answers questions appropriately.  There is no facial asymmetry. Extraocular motion intact.  She seems to have good strength with equal grips and moves all extremities to command.  IMAGING STUDIES:  CT scan of the head I have reviewed as well as report. She does have temporal tips, which I would not suspect on this CT scan. She does have some vague hyperdensity in the left sylvian fissure without mass effect, edema, or shift.  There is a small white dart within the fourth ventricle, the significance of which is unclear at this point.  ASSESSMENT/PLAN:  This is  a 45 year old female with diabetes and hypertension, who fell in the shower 24 hours ago and has had a severe headache ever since.  She does have some hyperdensity within the left sylvian fissure that is consistent with small amount of subarachnoid hemorrhage.  It is difficult to know if it is traumatic aneurysmal given her nuchal rigidity and severe headache with nausea, less likely need to rule out an aneurysm in this case, especially given the fact that she had no free syncopal symptoms that she can remember.  We will go ahead and ask her interventionalist perform a four-vessel cerebral arteriogram to rule out aneurysm.  We will put her on a sliding scale to cover her diabetes.  We will put her on Decadron 4 mg IV q.8 h., treat her headaches with  morphine we will go ahead and start her on nimodipine 60 mg p.o. q.4 h. and stop this should her arteriogram to be negative.  We will put her in the ICU with Neurologic checks every hour.  Dr. Ashok Pall will assume her case in the morning should she have an aneurysm as the neurosurgeon and is aware the patient is in the hospital ongoing for arteriogram.     Eustace Moore, MD     DSJ/MEDQ  D:  12/12/2010  T:  12/13/2010  Job:  [id number]  Electronically Signed by Sherley Bounds MD on 12/25/2010 12:58:16 PM

## 2010-12-28 NOTE — Discharge Summary (Signed)
  NAMERASHENA, Allison Durham                  ACCOUNT NO.:  [id number]  MEDICAL RECORD NO.:  [id number]           PATIENT TYPE:  I  LOCATION:  [id number]                         FACILITY:  [facility]  PHYSICIAN:  Ashok Pall, M.D.     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  PROCEDURE:  Endovascular coiling of left posterior communicating artery aneurysm.  COMPLICATIONS:  None.  DISCHARGE STATUS:  Alive and well.  INDICATIONS:  [name] presented with a subarachnoid hemorrhage to [facility] on December 12, 2010.  She had a syncopal episode when she was in the shower on December 11, 2010, did not remember a headache.  She was amnestic for the event.  CT scan ordered by family physician suggested subarachnoid hemorrhage and she was sent to the emergency room for further evaluation.  She did have a subarachnoid, and after undergoing an angiogram was found to have a ruptured left posterior communicating artery aneurysm.  She was coiled on the night of her admission and has done very well since then.  She has had absolutely no neurologic deficits.  Her headache is not at all significant.  She was hydrated for a week.  I just cannot find a reason to keep her in the hospital solely for the purpose of prophylaxis.  She lives in [place].  I told her to contact my office immediately if she has any change in her mental status or behavior, but she has done quite well and I do not see any reason for her to have to stay here.  I will see her again in 2 weeks.  She will be discharged home with Tylenol No. 3.          ______________________________ Ashok Pall, M.D.     KC/MEDQ  D:  12/20/2010  T:  12/21/2010  Job:  [id number]  Electronically Signed by Ashok Pall M.D. on 12/28/2010 04:14:24 PM

## 2011-01-24 NOTE — H&P (Signed)
Allison Durham, Allison Durham                  ACCOUNT NO.:  [id number]   MEDICAL RECORD NO.:  [id number]          PATIENT TYPE:  EMS   LOCATION:  ED                           [facility]   PHYSICIAN:  Farris Has, MDDATE OF BIRTH:  [birth year]   DATE OF ADMISSION:  07/24/2008  DATE OF DISCHARGE:                              HISTORY & PHYSICAL   PRIMARY CARE Jeanee Fabre:  Dr. Emeline General. White.   CHIEF COMPLAINT:  Nausea, vomiting, fever and generalized malaise.   The patient is a 45 year old female with a past medical history of type  2 diabetes on insulin, who presents with 3 days of fevers, chills,  nausea episode, vomiting x1, generalized malaise and muscle aches.  She  has not had any sick exposures.  She also endorses abdominal pain that  she says has been there for the past few months.  She has been evaluated  by a gynecologist, who felt that this was secondary to endometriosis,  but she feels that lately for the past few days she has been getting  worse.  She is not constipated.  She has not had any diarrhea.  No  bright blood per rectum.  No melena.  Otherwise, review of systems only  significant for mild chest discomfort and shortness of breath which she  just endorsed currently which is actually very, very mild.   PAST MEDICAL HISTORY:  Significant for diabetes.   SOCIAL HISTORY:  The patient does not smoke, use drugs or drink alcohol.   FAMILY HISTORY:  Noncontributory.   ALLERGIES:  NO KNOWN DRUG ALLERGIES.   MEDICATIONS:  1. Metformin 1000 mg twice a day.  2. NovoLog 70/30 at 35 units in the a.m. and 30 units in p.m.   PHYSICAL EXAMINATION:  VITAL SIGNS:  Temperature 101.8, blood pressure  168/95, pulse 72, respirations 20.  Satting 100% on room air.  GENERAL:  The patient appears to be in no acute distress, laying down in  bed.  HEENT:  Head is nontraumatic.  Wearing a mask.  Normal mucous membranes.  NECK:  Supple.  No lymphadenopathy noted.  HEART:  Regular  rate and rhythm, somewhat rapid though.  No murmurs  appreciated.  LUNGS:  Somewhat diminished breath sounds bilaterally.  No wheezes,  rhonchi or rales noted.  ABDOMEN:  Soft with slight suprapubic  tenderness, otherwise unremarkable.  LOWER EXTREMITIES:  Without clubbing, cyanosis or edema.  NEUROLOGIC:  Intact.  Strength 5/5 in all four extremities.   LABORATORY DATA:  White blood cell count is 8.0 with low lymphocyte  count, hemoglobin 10.9, sodium 134, potassium 4.0, creatinine 1.2.  UA  is showing 7-10 white blood cells with many bacteria, otherwise  unremarkable.  Chest x-ray showed no cardiopulmonary disease.  EKG not  obtained.  Blood glucose 411.   ASSESSMENT AND PLAN:  This is a 45 year old female with flu-like  symptoms and urinary tract infection with fever and mild chest  discomfort with history of diabetes.   1. Flu-like symptoms.  The patient is 72 hours from beginning of her      symptoms.  At this point, Tamiflu would not be helpful.  She does      not have any risk factors, except for diabetes.  We will place on      droplet precautions and obtain influenza swabs.  2. Diabetes type 2 with poor control.  While inpatient, will switch to      Lantus and advance to obtain control of her diabetes.  Will give      sliding scale insulin.  Hold metformin.  3. Urinary tract infection.  Given nausea, I cannot rule out      pyelonephritis.  Will obtain renal ultrasound.  Give IV fluids,      give Cipro.  Would evaluate for perinephric abscess with      ultrasound.  4. Dehydration.  Given slightly elevated creatinine and sodium will      give IV fluids and follow.  5. Anemia.  Will check anemia panel and TSH.  6. Prophylaxis.  Protonix and Lovenox.  7. Abdominal pain.  This is chronic, but given it is worsening will      start with a KUB.  Given that the patient is childbearing age, will      check urine pregnancy test.  Pelvic inflammatory disease is also in      the  differential.  If abdominal pain persists, may need further      abdominal imaging.  The fact that the patient reportedly was      evaluated by OB/GYN for some lower abdominal pain is somewhat      reassuring.  8. Low lymphocyte count could be normal variant with African American      female.  If the patient would be agreeable, HIV screening is always      helpful in a woman of childbearing age.      Farris Has, MD  Electronically Signed     AVD/MEDQ  D:  07/24/2008  T:  07/24/2008  Job:  [id number]   cc:   [facility]. Dema Severin, M.D.  Fax: [phone]

## 2011-01-27 NOTE — Discharge Summary (Signed)
Allison Durham, Allison Durham                  ACCOUNT NO.:  [id number]   MEDICAL RECORD NO.:  [id number]          PATIENT TYPE:  INP   LOCATION:  [id number]                         FACILITY:  [facility]   PHYSICIAN:  Irine Seal, MD    DATE OF BIRTH:  08/06/1966   DATE OF ADMISSION:  07/23/2008  DATE OF DISCHARGE:  07/29/2008                               DISCHARGE SUMMARY   PRIMARY CARE PHYSICIAN:  Dr. Harlan Stains of [facility].   DISCHARGE DIAGNOSES:  1. E. coli bacteremia.  2. E. coli urinary tract infection.  3. Iron-deficiency anemia in a menstruating female.  4. Dehydration.  5. Type 2 diabetes, hemoglobin A1c of 9.9.  6. Chronic abdominal pain.  7. Atypical chest pain with a negative cardiac workup.   DISCHARGE MEDICATIONS:  1. Rocephin 1 g IV daily x4 days.  2. Ciprofloxacin 500 mg p.o. b.i.d. x1 week.  3. Metformin 1 g p.o. b.i.d.  4. NovoLog 70/30 at 35 units q.a.m.  5. NovoLog 70/30 at 30 units q.h.s.   DISPOSITION AND FOLLOWUP:  Patient will be discharged home.  Patient is  to follow up with PCP in 2 weeks.  On followup, patient's diabetes needs  to be reassessed and patient's diabetic medications adjusted  accordingly.  Patient will need a followup CBC to follow up on her H and  H.  Patient will also need outpatient diabetes management.   PROCEDURES PERFORMED.:  1. A PICC line was placed on July 27, 2008.  2. A chest x-ray was performed on July 23 2008, that showed no      active cardiopulmonary disease.  3. Acute abdominal series was done on July 24, 2008, which was      negative abdominal exam.  4. A repeat chest x-ray was done on July 24, 2008, that showed no      significant abnormality and no interval change.  5. A renal ultrasound was done on July 24, 2008, which was a      normal study.  6. Repeat chest x-ray was done on July 27, 2008, that showed the      right PICC line was in the tip of the distal superior vena cava,      central  pulmonary vascular prominence, left base, subsegmental      atelectasis.   CONSULTATIONS DONE:  None.   BRIEF ADMISSION HISTORY AND PHYSICAL:  Allison Durham is a 45 year old  female, past medical history of type 2 diabetes on insulin who presented  with a 3-day history of fevers, chills, and episode of nausea and  vomiting, generalized malaise, and muscle aches.  She stated that she  had not had any sick exposures.  Patient also endorsed some abdominal  pain that she stated had been there for the past several months.  Patient had been evaluated by a gynecologist who felt that this was  secondary to endometriosis but she felt that lately for the past few  days she had been getting worse.  Patient was not constipated.  She had  not had any diarrhea.  No bright red blood  per rectum.  No melena.  Otherwise, review of systems was only significant for mild chest  discomfort and shortness of breath which she just endorsed currently  which was actually very, very mild.   PHYSICAL EXAM:  Per admitting physician, temperature 101.8, blood  pressure 168/95, pulse of 72, respirations 20, satting 100% on room air.  GENERAL:  Patient appeared to be in no acute distress, laying down on  the bed.  HEENT:  Normocephalic, atraumatic.  Normal mucous membranes.  Patient  wearing a mass.  NECK:  Supple.  No lymphadenopathy.  Cardiovascular:  Regular rate and rhythm.  No murmurs appreciated.  RESPIRATORY:  Diminished breath sounds bilaterally.  No wheezes.  No  rhonchi.  No rales.  ABDOMEN:  Soft with some slight suprapubic tenderness, otherwise was  unremarkable.  EXTREMITIES:  No clubbing, cyanosis, or edema.  NEUROLOGICAL:  Patient was intact with 5/5 strength in all 4  extremities.   ADMISSION LABS:  CBC, white count 8.0, low lymphocyte count, hemoglobin  of 10.9, sodium 134, potassium 4.0, creatinine 1.2.  UA showed 7 to 10  white blood cells and many bacteria, otherwise unremarkable.  Chest x-   ray showed no cardiopulmonary disease.  EKG was not obtained.  Blood  glucose at the time was 411.   HOSPITAL COURSE:  1. E. coli bacteremia.  Patient had initially presented with flu-like      symptoms which had been recurring for the prior 72 hours.  It was      felt at that time due to patient having these symptoms for 72 hours      Tamiflu would not be helpful at the time.  Patient was, however,      placed on some on droplet precautions.  Influenza swabs were      obtained which came back negative.  Blood cultures were also      obtained as patient was spiking fevers.  Patient was placed on      supportive care and HIV panel was obtained which came back      negative.  Blood cultures came out growing E. coli in 1/2 blood      cultures.  Patient was initially placed on IV Cipro during her      initial hospitalization.  Due to having E. coli bacteremia,      infectious diseases was curbsided and it was felt that patient      would benefit from IV Rocephin for 1 week and then we will change      to oral ciprofloxacin for another week.  It was felt like patient's      E. coli bacteremia had ceded from her urinary tract as the urine      cultures had also grown E. coli.  Patient spiked fevers for about a      couple of days during the hospitalization but subsequently      defervesced.  Patient remained afebrile for greater than 48 hours      prior to discharge.  Patient improved symptomatically and by day of      discharge patient was in stable and improved condition.  A PICC      line was placed so patient could receive the remainder of her IV      antibiotics at home and then subsequently be changed to oral      ciprofloxacin for 1 week.  Patient was discharged home in stable      and improved condition to  follow up with PCP as an outpatient.  2. E. coli urinary tract infection.  Patient was noted to have a UTI      on admission.  It was felt that pyelonephritis needed to be ruled       out.  Patient was hydrated with IV fluids, initially placed on IV      Cipro.  Urine cultures were pending at the time of initial      admission.  A renal ultrasound was obtained which was negative for      any perinephric abscess or any signs of pyelonephritis.  Patient's      urine cultures had come back which had grown out E. coli and was      sensitive to both Cipro and Rocephin.  Patient was subsequently      changed to Rocephin 1 g IV to combat both E. coli bacteremia and E.      coli urinary tract infection as was it felt that patient's E. coli      urinary tract infection was the source of her bacteremia.  Patient      was placed on IV Rocephin for 1 week and then will subsequently      change to oral ciprofloxacin 500 mg twice daily for 1 week per      recommendations of a curbside infectious disease consult.  Patient      was afebrile for greater than 48 hours prior to discharge and      patient was discharged in stable and improved condition.  3. Patient had initially come in and seen.  On admission, had a CBC      which was obtained with a hemoglobin of 10.8.  It was felt that in      a menstruating female this was likely secondary to iron-deficiency      anemia and anemia panel was obtained and patient's iron levels came      out at 11, TIBC of 238, and a vitamin B12 of 412 and a serum folate      of greater than 20 and a ferritin of 53.  Patient's H and H      remained stable throughout the hospitalization and by day of      discharge patient's hemoglobin was 10.6.  It was felt that patient      would benefit from oral iron supplements, preferably iron sulfate      325 mg t.i.d. as an outpatient and this will be deferred to be      started per patient's PCP.  4. Dehydration.  Patient was noted to be dehydrated on admission.      Patient was hydrated with IV fluids and was euvolemic by day of      discharge.  5. Type 2 diabetes, on insulin and Glucophage.  Patient was  switched      to Lantus during the hospitalization, placed on a sliding scale      insulin, and started at 30 units of Lantus which was further      titrated per her CBG.  Patient will need to follow up with her PCP      in terms of further diabetes management.  Outpatient diabetes      management was arranged for the patient and patient was discharged      in stable condition.  6. Chest pain.  Patient on admission was noted to have some chest pain      which was felt  to be more atypical in nature and likely secondary      to her underlying illness.  Cardiac enzymes were cycled which came      back negative.  EKG was within normal limits and patient was      discharged in stable condition.   On day of discharge, vital signs, temperature of 98.5, pulse of 86,  blood pressure 151/100, respirations 16, satting 100% on room air.   DISCHARGE LABS:  Sodium of 137, potassium 3.8, chloride 108, bicarb 23,  glucose 194, BUN 9, creatinine 0.8, calcium of 9.0.  CBC, white count  4.5, hemoglobin 10.6, hematocrit 30.4, platelets of 307.  It was a  pleasure taking care of Allison Durham.      Irine Seal, MD  Electronically Signed     DT/MEDQ  D:  09/01/2008  T:  09/01/2008  Job:  [id number]   cc:   [facility]. Dema Severin, M.D.  Fax: [phone]

## 2011-06-13 LAB — POCT I-STAT, CHEM 8
BUN: 16
Calcium, Ion: 1.12
Chloride: 103
Creatinine, Ser: 1.2
Glucose, Bld: 412 — ABNORMAL HIGH
TCO2: 21

## 2011-06-13 LAB — TROPONIN I: Troponin I: 0.02

## 2011-06-13 LAB — BASIC METABOLIC PANEL
CO2: 23
CO2: 23
CO2: 24
Calcium: 8.1 — ABNORMAL LOW
Calcium: 8.3 — ABNORMAL LOW
Chloride: 107
Chloride: 108
Creatinine, Ser: 0.8
Creatinine, Ser: 0.85
Creatinine, Ser: 0.85
GFR calc Af Amer: >60
GFR calc Af Amer: >60
GFR calc Af Amer: >60
GFR calc Af Amer: >60
GFR calc Af Amer: >60
GFR calc non Af Amer: >60
GFR calc non Af Amer: >60
Potassium: 4
Sodium: 137
Sodium: 137
Sodium: 137
Sodium: 137

## 2011-06-13 LAB — CBC
HCT: 30.2 — ABNORMAL LOW
HCT: 33.7 — ABNORMAL LOW
Hemoglobin: 10.1 — ABNORMAL LOW
Hemoglobin: 10.6 — ABNORMAL LOW
Hemoglobin: 11.2 — ABNORMAL LOW
Hemoglobin: 9.7 — ABNORMAL LOW
Hemoglobin: 9.8 — ABNORMAL LOW
Hemoglobin: 9.8 — ABNORMAL LOW
MCHC: 33.5
MCHC: 34.2
MCHC: 34.8
MCV: 84.4
MCV: 84.5
MCV: 84.9
Platelets: 192
Platelets: 210
RBC: 3.39 — ABNORMAL LOW
RBC: 3.4 — ABNORMAL LOW
RBC: 3.4 — ABNORMAL LOW
RBC: 3.6 — ABNORMAL LOW
RBC: 3.92
WBC: 3.6 — ABNORMAL LOW
WBC: 4.5
WBC: 5.8
WBC: 8.1

## 2011-06-13 LAB — URINALYSIS, ROUTINE W REFLEX MICROSCOPIC
Glucose, UA: >1000 — AB
Protein, ur: NEGATIVE
Specific Gravity, Urine: 1.017

## 2011-06-13 LAB — LIPID PANEL
HDL: 60
Total CHOL/HDL Ratio: 2.2
Triglycerides: 43

## 2011-06-13 LAB — COMPREHENSIVE METABOLIC PANEL
ALT: 40 — ABNORMAL HIGH
AST: 37
Albumin: 3.2 — ABNORMAL LOW
Calcium: 8.8
Chloride: 106
Creatinine, Ser: 0.89
GFR calc Af Amer: >60
Sodium: 136
Total Bilirubin: 0.9

## 2011-06-13 LAB — CULTURE, BLOOD (ROUTINE X 2): Culture: NO GROWTH

## 2011-06-13 LAB — GLUCOSE, CAPILLARY
Glucose-Capillary: 100 — ABNORMAL HIGH
Glucose-Capillary: 100 — ABNORMAL HIGH
Glucose-Capillary: 103 — ABNORMAL HIGH
Glucose-Capillary: 110 — ABNORMAL HIGH
Glucose-Capillary: 137 — ABNORMAL HIGH
Glucose-Capillary: 146 — ABNORMAL HIGH
Glucose-Capillary: 147 — ABNORMAL HIGH
Glucose-Capillary: 155 — ABNORMAL HIGH
Glucose-Capillary: 166 — ABNORMAL HIGH
Glucose-Capillary: 167 — ABNORMAL HIGH
Glucose-Capillary: 168 — ABNORMAL HIGH
Glucose-Capillary: 189 — ABNORMAL HIGH
Glucose-Capillary: 191 — ABNORMAL HIGH
Glucose-Capillary: 200 — ABNORMAL HIGH
Glucose-Capillary: 206 — ABNORMAL HIGH
Glucose-Capillary: 378 — ABNORMAL HIGH
Glucose-Capillary: 82
Glucose-Capillary: 88

## 2011-06-13 LAB — DIFFERENTIAL
Basophils Absolute: 0
Basophils Relative: 0
Eosinophils Absolute: 0.2
Eosinophils Absolute: 0.2
Eosinophils Relative: 1
Eosinophils Relative: 4
Lymphocytes Relative: 30
Lymphocytes Relative: 32
Lymphocytes Relative: 7 — ABNORMAL LOW
Lymphs Abs: 0.6 — ABNORMAL LOW
Lymphs Abs: 1.2
Lymphs Abs: 1.3
Monocytes Absolute: 0.4
Monocytes Absolute: 0.5
Monocytes Absolute: 0.5
Monocytes Absolute: 0.5
Monocytes Absolute: 0.6
Monocytes Relative: 11
Monocytes Relative: 13 — ABNORMAL HIGH
Monocytes Relative: 15 — ABNORMAL HIGH
Monocytes Relative: 15 — ABNORMAL HIGH
Monocytes Relative: 5
Neutro Abs: 1.7
Neutro Abs: 2.1
Neutro Abs: 2.5
Neutro Abs: 7
Neutrophils Relative %: 47
Neutrophils Relative %: 52
Neutrophils Relative %: 56
Neutrophils Relative %: 57

## 2011-06-13 LAB — IRON AND TIBC
Iron: 11 — ABNORMAL LOW
Saturation Ratios: 5 — ABNORMAL LOW
TIBC: 238 — ABNORMAL LOW

## 2011-06-13 LAB — RETICULOCYTES: Retic Ct Pct: 1.1

## 2011-06-13 LAB — VITAMIN B12: Vitamin B-12: 412 (ref 211–911)

## 2011-06-13 LAB — CARDIAC PANEL(CRET KIN+CKTOT+MB+TROPI): Troponin I: 0.01

## 2011-06-13 LAB — CK TOTAL AND CKMB (NOT AT ARMC): Total CK: 570 — ABNORMAL HIGH

## 2011-06-13 LAB — HEMOGLOBIN A1C
Hgb A1c MFr Bld: 9.9 — ABNORMAL HIGH
Mean Plasma Glucose: 237

## 2011-06-13 LAB — URINE MICROSCOPIC-ADD ON

## 2011-06-13 LAB — URINE CULTURE

## 2011-06-13 LAB — TSH: TSH: 0.693

## 2011-06-13 LAB — APTT: aPTT: 34

## 2011-06-13 LAB — OCCULT BLOOD X 1 CARD TO LAB, STOOL: Fecal Occult Bld: NEGATIVE

## 2011-06-13 LAB — FOLATE: Folate: >20

## 2011-07-04 ENCOUNTER — Other Ambulatory Visit (HOSPITAL_COMMUNITY): Payer: Self-pay | Admitting: Interventional Radiology

## 2011-07-04 DIAGNOSIS — I729 Aneurysm of unspecified site: Secondary | ICD-10-CM

## 2011-07-11 ENCOUNTER — Other Ambulatory Visit (HOSPITAL_COMMUNITY): Payer: Self-pay | Admitting: Interventional Radiology

## 2011-07-11 ENCOUNTER — Ambulatory Visit (HOSPITAL_COMMUNITY)
Admission: RE | Admit: 2011-07-11 | Discharge: 2011-07-11 | Disposition: A | Discharge status: Home / Self Care | Payer: BC Managed Care – PPO | Source: Ambulatory Visit | Attending: Interventional Radiology | Admitting: Interventional Radiology

## 2011-07-11 DIAGNOSIS — I671 Cerebral aneurysm, nonruptured: Secondary | ICD-10-CM | POA: Insufficient documentation

## 2011-07-11 DIAGNOSIS — I729 Aneurysm of unspecified site: Secondary | ICD-10-CM

## 2011-07-11 DIAGNOSIS — R51 Headache: Secondary | ICD-10-CM | POA: Insufficient documentation

## 2011-07-11 LAB — GLUCOSE, CAPILLARY: Glucose-Capillary: 140 mg/dL — ABNORMAL HIGH (ref 70–99)

## 2011-07-11 LAB — BASIC METABOLIC PANEL
BUN: 26 mg/dL — ABNORMAL HIGH (ref 6–23)
Calcium: 9.9 mg/dL (ref 8.4–10.5)
Creatinine, Ser: 1.31 mg/dL — ABNORMAL HIGH (ref 0.50–1.10)
GFR calc non Af Amer: 48 mL/min — ABNORMAL LOW (ref >90)
Glucose, Bld: 143 mg/dL — ABNORMAL HIGH (ref 70–99)

## 2011-07-11 LAB — CBC
MCH: 28 pg (ref 26.0–34.0)
MCHC: 34.2 g/dL (ref 30.0–36.0)
Platelets: 203 10*3/uL (ref 150–400)

## 2011-07-11 LAB — PROTIME-INR
INR: 1.05 (ref 0.00–1.49)
Prothrombin Time: 13.9 seconds (ref 11.6–15.2)

## 2011-07-11 IMAGING — XA IR ANGIO/CAROTID/CERV BI
1 series · 13 of 24 positions shown · IV contrast (IODINE)
Comparison: Catheter angiogram of [DATE].

CLINICAL DATA: Previous history of ruptured left-sided intracranial
aneurysm status post endovascular treatment. Complains of
occasional headaches.

BILATERAL CAROTID ARTERIOGRAPHY

[Series 300: neuro · 13 of 87 slices shown]
[im 1/87]
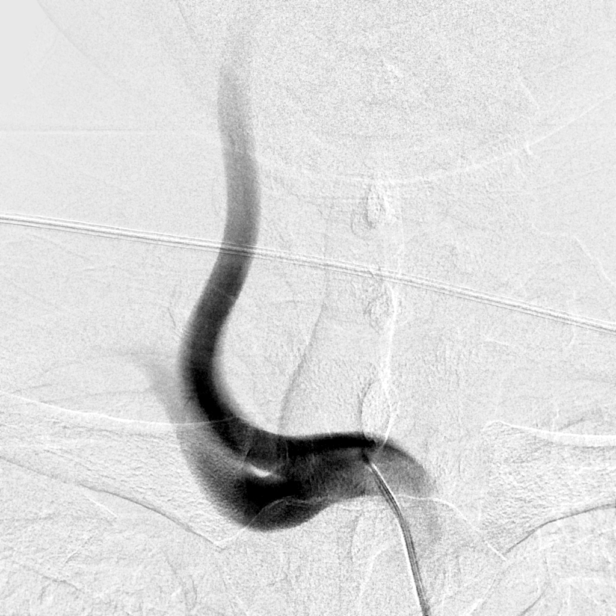
[im 8/87]
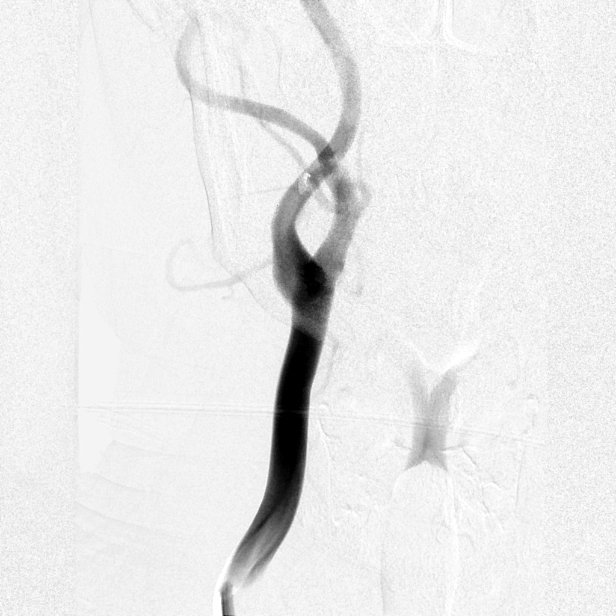
[im 15/87]
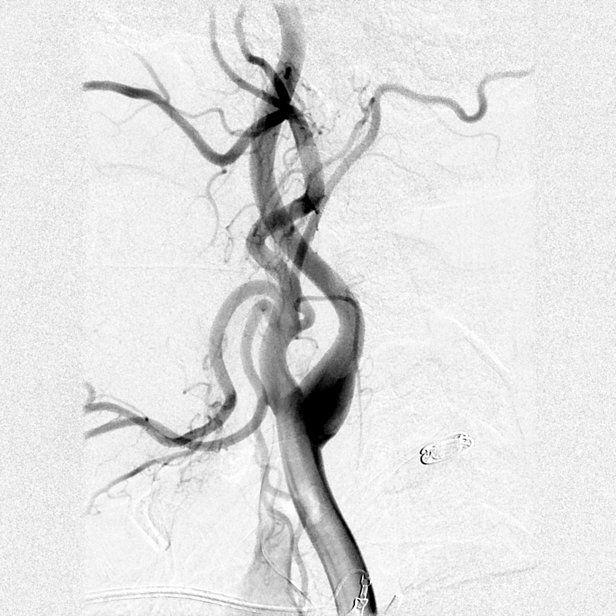
[im 23/87]
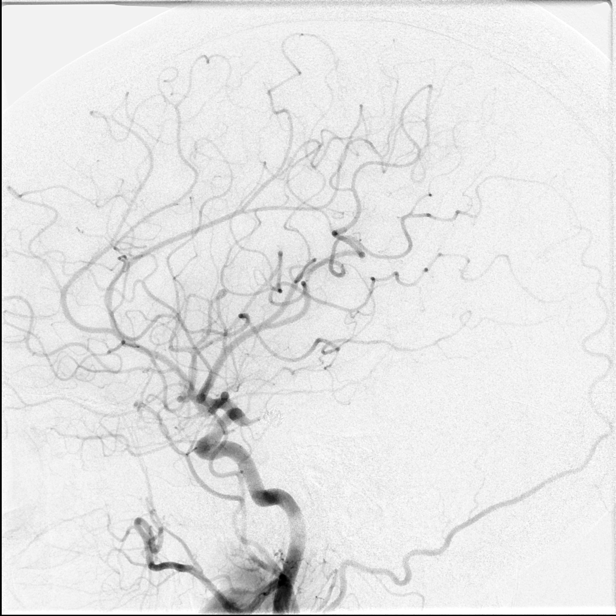
[im 30/87]
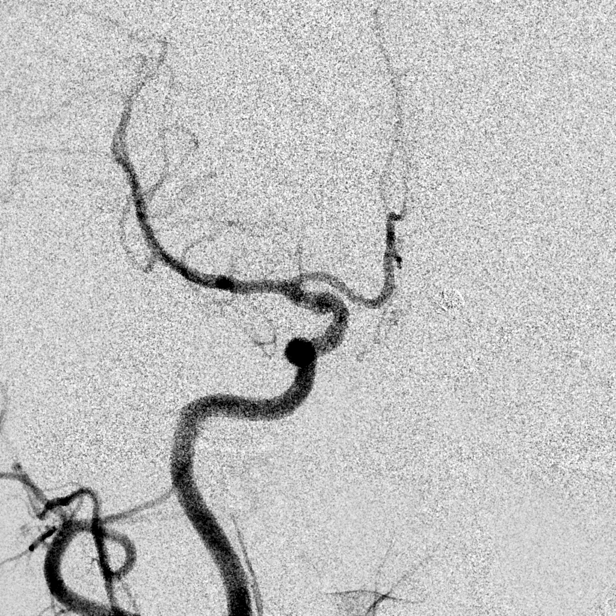
[im 38/87]
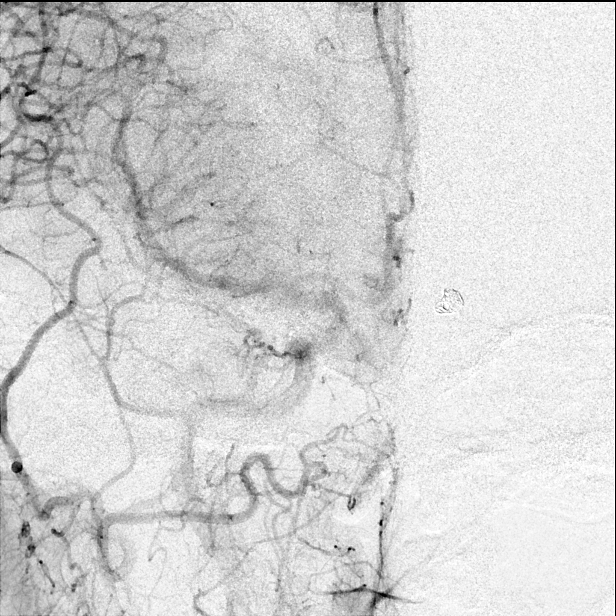
[im 45/87]
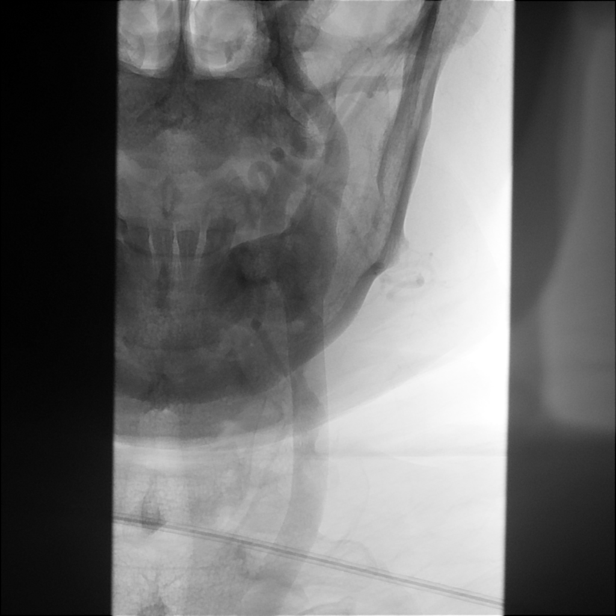
[im 49/87]
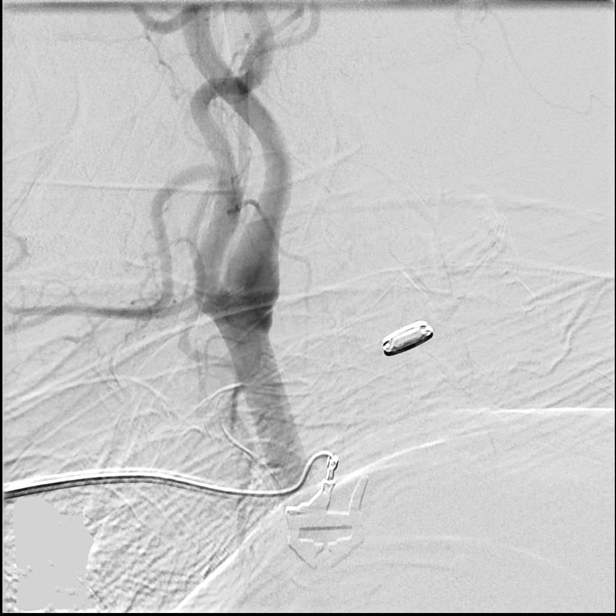
[im 57/87]
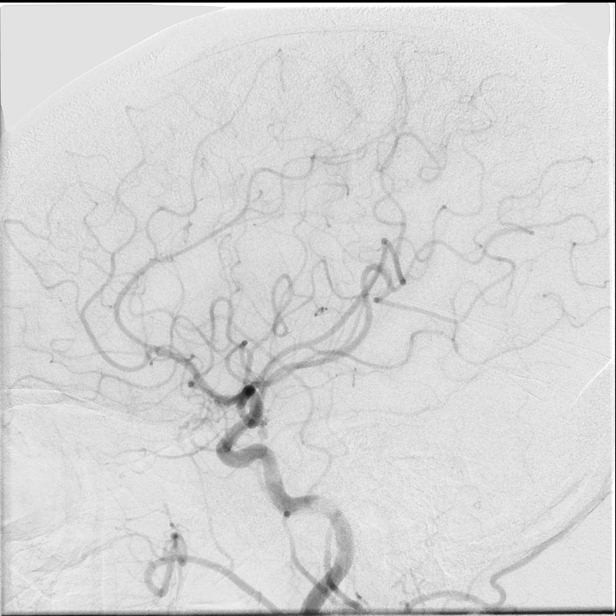
[im 64/87]
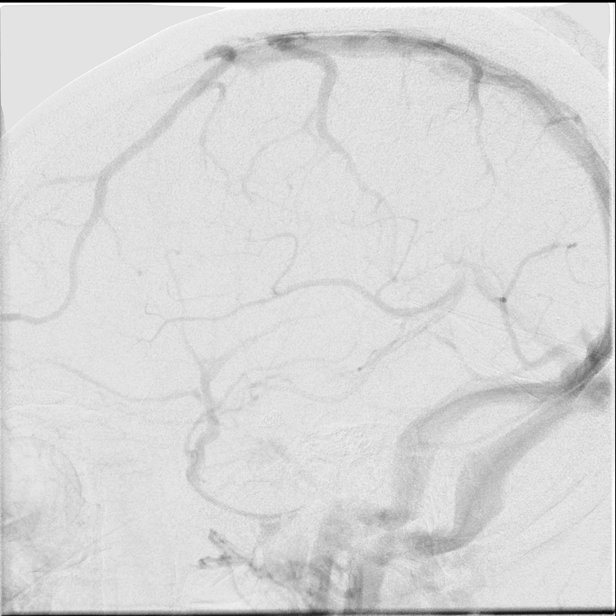
[im 72/87]
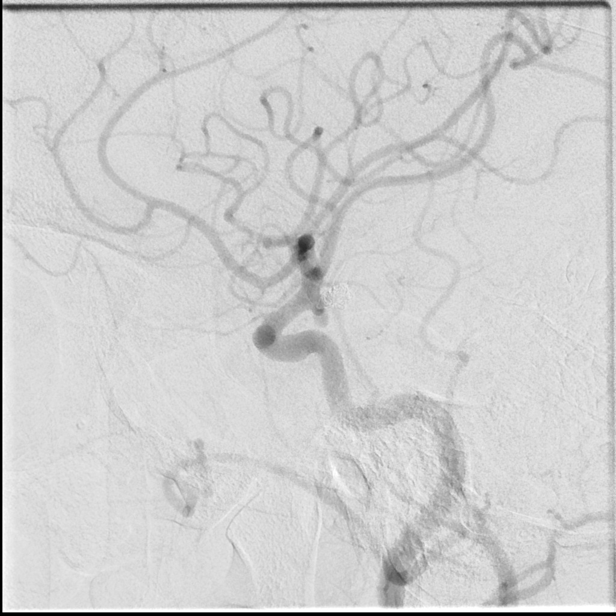
[im 79/87]
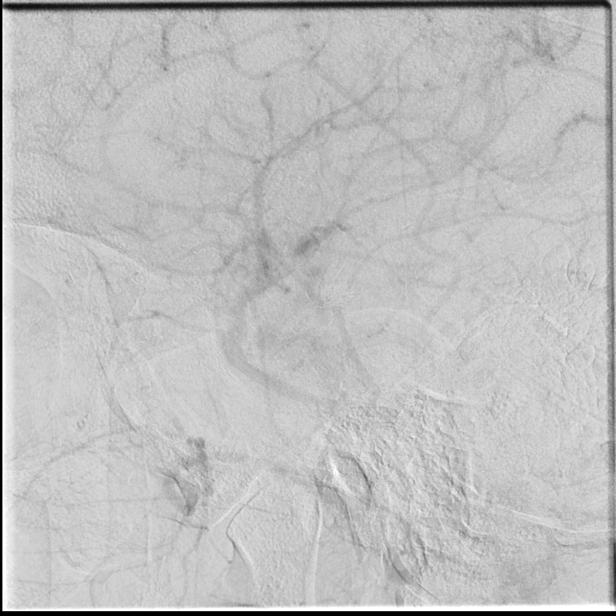
[im 87/87]
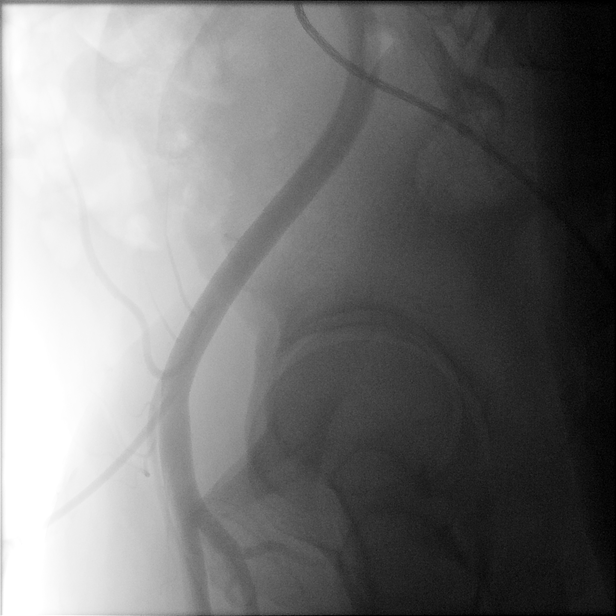

[13 of 24 positions shown; findings below may reference images not displayed]

Following a full explanation of the procedure along with the
potential associated complications, an informed witnessed consent
was obtained.

The right groin was prepped and draped in the usual sterile
fashion.  Thereafter using modified Seldinger technique,
transfemoral access into the right common femoral artery was
obtained without difficulty.  Over a 0.035-inch guidewire, a 5-
French Pinnacle sheath was inserted.  Through this and also over a
0.035-inch guidewire, a 5-French JB1 catheter was advanced to the
aortic arch region and selectively positioned in the right common
carotid artery and the left common carotid artery.

There were no acute complications.  The patient tolerated the
procedure well.

Medications utilized: Versed 1 mg IV.  Fentanyl 25 mcg IV.

Contrast:  Omnipaque 300 approximately 30 ml.
FINDINGS: The right common carotid arteriogram demonstrates the
right external carotid artery and its major branches to be normal.

The right internal carotid artery at the bulb to the cranial skull
base opacifies normally.

The petrous, the cavernous and the supraclinoid segments are
normal.

A right-sided PCOM region infundibulum is seen unchanged.

The right middle and the right anterior cerebral arteries opacify
normally into the capillary and venous phases.

Transient opacification via the anterior communicating artery of
the left anterior cerebral artery A2 segment is seen.

The left common carotid arteriogram demonstrates the left external
carotid artery and its major branches to be normal.

The left internal carotid artery at the bulb to the cranial skull
base also opacifies normally.

The petrous, the cavernous and the supraclinoid segments are
normal.

Again noted is the previously endovascularly treated left posterior
communicating artery region which demonstrates mild recanalization
secondary to coil compaction at the neck of the previously
endovascularly treated aneurysm.

Also demonstrated is a tear drop outpouching projecting inferiorly
at the site of the previously endovascularly treated PCOM aneurysm.

This measures approximately 4.3 mm x 3 mm with a narrow neck

The left middle and the left anterior cerebral arteries opacify
normally into capillary and venous phases.
IMPRESSION: 1.  Approximately 4.3 mm x 3 mm saccular outpouching projecting
inferiorly at the site of the previously treated left posterior
communicating artery region aneurysm. This probably represents a
new aneurysm.
2.  Addition of mild recanalization at the neck of the previously
endovascular treated aneurysm.

The above findings were reviewed with the patient.  She denies
smoking.  However she does have high blood pressure for which she
takes antihypertensives.  A follow-up clinic appointment will be
arranged to consider management of this new aneurysm.

## 2011-07-11 MED ORDER — IOHEXOL 300 MG/ML  SOLN
150.0000 mL | Freq: Once | INTRAMUSCULAR | Status: AC | PRN
  Administered 2011-07-11: 50 mL via INTRA_ARTERIAL

## 2011-07-14 ENCOUNTER — Other Ambulatory Visit (HOSPITAL_COMMUNITY): Payer: Self-pay | Admitting: Interventional Radiology

## 2011-07-14 DIAGNOSIS — I671 Cerebral aneurysm, nonruptured: Secondary | ICD-10-CM

## 2011-07-20 ENCOUNTER — Ambulatory Visit (HOSPITAL_COMMUNITY)
Admission: RE | Admit: 2011-07-20 | Discharge: 2011-07-20 | Disposition: A | Discharge status: Home / Self Care | Payer: BC Managed Care – PPO | Source: Ambulatory Visit | Attending: Interventional Radiology | Admitting: Interventional Radiology

## 2011-07-20 DIAGNOSIS — I671 Cerebral aneurysm, nonruptured: Secondary | ICD-10-CM

## 2011-07-26 ENCOUNTER — Other Ambulatory Visit (HOSPITAL_COMMUNITY): Payer: Self-pay | Admitting: Interventional Radiology

## 2011-07-26 DIAGNOSIS — I729 Aneurysm of unspecified site: Secondary | ICD-10-CM

## 2011-08-07 ENCOUNTER — Other Ambulatory Visit: Payer: Self-pay | Admitting: Radiology

## 2011-08-08 ENCOUNTER — Encounter (HOSPITAL_COMMUNITY): Payer: Self-pay

## 2011-08-08 ENCOUNTER — Other Ambulatory Visit (HOSPITAL_COMMUNITY): Payer: BC Managed Care – PPO

## 2011-08-08 NOTE — Progress Notes (Signed)
LM for Allison Durham requesting alternate phone number to contact pt. Work and mobile phone numbers not valid.

## 2011-08-09 ENCOUNTER — Encounter (HOSPITAL_COMMUNITY): Payer: Self-pay

## 2011-08-09 ENCOUNTER — Ambulatory Visit (HOSPITAL_COMMUNITY)
Admission: RE | Admit: 2011-08-09 | Discharge: 2011-08-11 | Disposition: A | Discharge status: Home / Self Care | Payer: BC Managed Care – PPO | Source: Ambulatory Visit | Attending: Interventional Radiology | Admitting: Interventional Radiology

## 2011-08-09 ENCOUNTER — Ambulatory Visit (HOSPITAL_COMMUNITY): Payer: BC Managed Care – PPO | Admitting: Certified Registered"

## 2011-08-09 ENCOUNTER — Encounter (HOSPITAL_COMMUNITY): Payer: Self-pay | Admitting: Certified Registered"

## 2011-08-09 DIAGNOSIS — I729 Aneurysm of unspecified site: Secondary | ICD-10-CM

## 2011-08-09 DIAGNOSIS — I10 Essential (primary) hypertension: Secondary | ICD-10-CM | POA: Insufficient documentation

## 2011-08-09 DIAGNOSIS — Z9889 Other specified postprocedural states: Secondary | ICD-10-CM | POA: Insufficient documentation

## 2011-08-09 DIAGNOSIS — I671 Cerebral aneurysm, nonruptured: Secondary | ICD-10-CM | POA: Insufficient documentation

## 2011-08-09 DIAGNOSIS — E119 Type 2 diabetes mellitus without complications: Secondary | ICD-10-CM | POA: Insufficient documentation

## 2011-08-09 DIAGNOSIS — K219 Gastro-esophageal reflux disease without esophagitis: Secondary | ICD-10-CM | POA: Insufficient documentation

## 2011-08-09 DIAGNOSIS — Z538 Procedure and treatment not carried out for other reasons: Secondary | ICD-10-CM | POA: Insufficient documentation

## 2011-08-09 HISTORY — DX: Gastro-esophageal reflux disease without esophagitis: K21.9

## 2011-08-09 LAB — CBC
HCT: 30.4 % — ABNORMAL LOW (ref 36.0–46.0)
Platelets: 213 10*3/uL (ref 150–400)
RDW: 13.4 % (ref 11.5–15.5)
WBC: 2.7 10*3/uL — ABNORMAL LOW (ref 4.0–10.5)

## 2011-08-09 LAB — COMPREHENSIVE METABOLIC PANEL
ALT: 23 U/L (ref 0–35)
AST: 29 U/L (ref 0–37)
Albumin: 3.4 g/dL — ABNORMAL LOW (ref 3.5–5.2)
Alkaline Phosphatase: 64 U/L (ref 39–117)
BUN: 29 mg/dL — ABNORMAL HIGH (ref 6–23)
Chloride: 102 mEq/L (ref 96–112)
Potassium: 4.2 mEq/L (ref 3.5–5.1)
Total Bilirubin: 0.2 mg/dL — ABNORMAL LOW (ref 0.3–1.2)

## 2011-08-09 LAB — DIFFERENTIAL
Basophils Relative: 0 % (ref 0–1)
Eosinophils Absolute: 0.3 10*3/uL (ref 0.0–0.7)
Eosinophils Relative: 10 % — ABNORMAL HIGH (ref 0–5)
Monocytes Absolute: 0.2 10*3/uL (ref 0.1–1.0)
Monocytes Relative: 9 % (ref 3–12)

## 2011-08-09 LAB — APTT: aPTT: 27 seconds (ref 24–37)

## 2011-08-09 MED ORDER — ASPIRIN EC 325 MG PO TBEC
325.0000 mg | DELAYED_RELEASE_TABLET | ORAL | Status: AC
  Administered 2011-08-09: 325 mg via ORAL

## 2011-08-09 MED ORDER — ASPIRIN 325 MG PO TABS
ORAL_TABLET | ORAL | Status: DC | PRN
  Administered 2011-08-09: 325 mg via ORAL

## 2011-08-09 MED ORDER — ASPIRIN EC 325 MG PO TBEC
DELAYED_RELEASE_TABLET | ORAL | Status: AC
  Filled 2011-08-09: qty 1

## 2011-08-09 MED ORDER — CEFAZOLIN SODIUM 1-5 GM-% IV SOLN: 1.0000 g | Freq: Once | INTRAVENOUS | Status: DC

## 2011-08-09 MED ORDER — MIDAZOLAM HCL 5 MG/5ML IJ SOLN
INTRAMUSCULAR | Status: DC | PRN
  Administered 2011-08-09: 1 mg via INTRAVENOUS

## 2011-08-09 MED ORDER — CLOPIDOGREL BISULFATE 300 MG PO TABS
300.0000 mg | ORAL_TABLET | Freq: Once | ORAL | Status: DC
  Filled 2011-08-09: qty 1

## 2011-08-09 MED ORDER — NIMODIPINE 30 MG PO CAPS
ORAL_CAPSULE | ORAL | Status: AC
  Filled 2011-08-09: qty 2

## 2011-08-09 MED ORDER — CLOPIDOGREL BISULFATE 75 MG PO TABS
ORAL_TABLET | ORAL | Status: DC | PRN
  Administered 2011-08-09: 300 mg via ORAL

## 2011-08-09 MED ORDER — SODIUM CHLORIDE 0.9 % IV SOLN: INTRAVENOUS | Status: DC

## 2011-08-09 MED ORDER — ASPIRIN 325 MG PO TABS
325.0000 mg | ORAL_TABLET | Freq: Once | ORAL | Status: DC
  Filled 2011-08-09: qty 1

## 2011-08-09 MED ORDER — CLOPIDOGREL BISULFATE 75 MG PO TABS
75.0000 mg | ORAL_TABLET | ORAL | Status: AC
  Administered 2011-08-09: 75 mg via ORAL
  Filled 2011-08-09: qty 1

## 2011-08-09 MED ORDER — NIMODIPINE 30 MG PO CAPS
60.0000 mg | ORAL_CAPSULE | ORAL | Status: AC
  Administered 2011-08-09: 60 mg via ORAL

## 2011-08-09 MED ORDER — LACTATED RINGERS IV SOLN
INTRAVENOUS | Status: DC | PRN
  Administered 2011-08-09: 08:00:00 via INTRAVENOUS

## 2011-08-09 NOTE — Progress Notes (Signed)
Received from holding area awake and oriented.  Denies dizziness or lightheadedness.  Ambulated to BR without difficulty.  IV's out and arterial line pressure dressing dry and intact. Instructed to leave pressure dressing on for several hours.   Pt instructed by Dr Estanislado Pandy that office will call to reschedule. Taking fluids. Instructed not to drive today and to eat light.   Discharged via wheelchair to friend.

## 2011-08-09 NOTE — Transfer of Care (Signed)
Immediate Anesthesia Transfer of Care Note  Patient: Allison Durham  Procedure(s) Performed: * No procedures listed *  Patient Location: PACU and Short Stay  Anesthesia Type: Case cancelled  Level of Consciousness: awake  Airway & Oxygen Therapy: Patient Spontanous Breathing  Post-op Assessment: Report given to PACU RN  Post vital signs: stable  Complications: No apparent anesthesia complications

## 2011-08-09 NOTE — H&P (Signed)
Allison Durham is an 45 y.o. female.   Chief Complaint:   Intracranial aneurysm with history of prior embolization of the left PCA aneurysm.  HPI: 45 year old African American female with history of SAH s/p coiling.  Follow up angiogram 07/12/11 revealed a 4.3 x 3 mm outpouching inferiorly from left PCA previously coiled aneurysm concerning for a new aneurysm as well as mild recanalization of the neck of the previously treated aneurysm.  Patient was seen in consultation to review these new findings in which she has decided to proceed with intravascular embolization/coiling with possible stent placement under general anesthesia; of which patient presents today for same.  Patient confirms staring plavix 75 mg last 3 days in preparation for this procedure. She denies any headaches, weakness or visual changes associated with these new findings. She will present down to radiology for her treatment with the intention to spend the night in the neuro-intensive care unit.   Past Medical History  Diagnosis Date  . Hypertension   . Diabetes mellitus   . Cerebral aneurysm   . GERD (gastroesophageal reflux disease)     Past Surgical History  Procedure Date  . Cerebral aneurysm repair     History reviewed. No pertinent family history. Social History:  reports that she has never smoked. She does not have any smokeless tobacco history on file. She reports that she does not drink alcohol or use illicit drugs.  Allergies: No Known Allergies  Medications Prior to Admission  Medication Dose Route Frequency Provider Last Rate Last Dose  . 0.9 %  sodium chloride infusion   Intravenous Continuous Lavonia Drafts, PA      . aspirin EC 325 MG tablet           . aspirin EC tablet 325 mg  325 mg Oral 60 min Pre-Op Lavonia Drafts, PA   325 mg at 08/09/11 0720  . ceFAZolin (ANCEF) IVPB 1 g/50 mL premix  1 g Intravenous Once Lavonia Drafts, PA      . clopidogrel (PLAVIX) tablet 300 mg  300 mg Oral Once Coolidge Breeze, PA      . clopidogrel (PLAVIX) tablet 75 mg  75 mg Oral 60 min Pre-Op Lavonia Drafts, PA   75 mg at 08/09/11 0721  . niMODipine (NIMOTOP) 30 MG capsule           . niMODipine (NIMOTOP) capsule 60 mg  60 mg Oral 60 min Pre-Op Lavonia Drafts, PA   60 mg at 08/09/11 [id number]   No current outpatient prescriptions on file as of 08/09/2011.    Results for orders placed during the hospital encounter of 08/09/11 (from the past 48 hour(s))  GLUCOSE, CAPILLARY     Status: Abnormal   Collection Time   08/09/11  6:52 AM      Component Value Range Comment   Glucose-Capillary 202 (*) 70 - 99 (mg/dL)   APTT     Status: Normal   Collection Time   08/09/11  6:54 AM      Component Value Range Comment   aPTT 27  24 - 37 (seconds)   CBC     Status: Abnormal   Collection Time   08/09/11  6:54 AM      Component Value Range Comment   WBC 2.7 (*) 4.0 - 10.5 (K/uL)    RBC 3.67 (*) 3.87 - 5.11 (MIL/uL)    Hemoglobin 10.4 (*) 12.0 - 15.0 (g/dL)    HCT 30.4 (*)  36.0 - 46.0 (%)    MCV 82.8  78.0 - 100.0 (fL)    MCH 28.3  26.0 - 34.0 (pg)    MCHC 34.2  30.0 - 36.0 (g/dL)    RDW 13.4  11.5 - 15.5 (%)    Platelets 213  150 - 400 (K/uL)   DIFFERENTIAL     Status: Abnormal   Collection Time   08/09/11  6:54 AM      Component Value Range Comment   Neutrophils Relative 47  43 - 77 (%)    Neutro Abs 1.3 (*) 1.7 - 7.7 (K/uL)    Lymphocytes Relative 34  12 - 46 (%)    Lymphs Abs 0.9  0.7 - 4.0 (K/uL)    Monocytes Relative 9  3 - 12 (%)    Monocytes Absolute 0.2  0.1 - 1.0 (K/uL)    Eosinophils Relative 10 (*) 0 - 5 (%)    Eosinophils Absolute 0.3  0.0 - 0.7 (K/uL)    Basophils Relative 0  0 - 1 (%)    Basophils Absolute 0.0  0.0 - 0.1 (K/uL)   PLATELET INHIBITION P2Y12     Status: Normal   Collection Time   08/09/11  6:54 AM      Component Value Range Comment   Platelet Function  P2Y12 362  194 - 418 (PRU)   PROTIME-INR     Status: Normal   Collection Time   08/09/11  6:54 AM      Component  Value Range Comment   Prothrombin Time 13.8  11.6 - 15.2 (seconds)    INR 1.04  0.00 - 1.49      Review of Systems  Constitutional: Negative.  Negative for fever and chills.  HENT: Negative.   Eyes: Negative.   Respiratory: Negative.   Cardiovascular: Negative.   Gastrointestinal: Positive for heartburn.  Genitourinary: Negative.   Musculoskeletal: Negative.   Skin: Negative.   Neurological: Negative.   Endo/Heme/Allergies: Negative.   Psychiatric/Behavioral: Negative.     Blood pressure 142/88, pulse 82, temperature 98.5 F (36.9 C), temperature source Oral, resp. rate 18, height 5\' 2"  (1.575 m), weight 176 lb (79.833 kg), last menstrual period 06/08/2011, SpO2 99.00%. Physical Exam  Constitutional: She is oriented to person, place, and time. She appears well-developed and well-nourished. No distress.  HENT:  Head: Normocephalic and atraumatic.  Eyes: EOM are normal. Pupils are equal, round, and reactive to light.  Neck: Normal range of motion.  Cardiovascular: Normal rate, regular rhythm, normal heart sounds and intact distal pulses.  Exam reveals no gallop and no friction rub.   No murmur heard. Respiratory: Effort normal and breath sounds normal. She has no wheezes. She has no rales. She exhibits no tenderness.  GI: Soft. Bowel sounds are normal. She exhibits no distension. There is no tenderness.  Musculoskeletal: Normal range of motion. She exhibits no edema and no tenderness.  Lymphadenopathy:    She has no cervical adenopathy.  Neurological: She is alert and oriented to person, place, and time.  Skin: Skin is warm and dry. She is not diaphoretic.  Psychiatric: She has a normal mood and affect. Her behavior is normal. Judgment and thought content normal.     Assessment/Plan 1. Possible new aneurysm projecting from previously coiled aneurysm of the left PCA and mild neck recannalization of the previously treated aneurysm. Patient to undergo treatment today with general  anesthesia.  Plavix levels on low end of platelet inhibition - will supplement with 300 mg po  now. 2. Diabetes : will hold metformin given IV contrast utilization - will cover with sliding scale insulin. 3. Hypertension : stable. Will continue home medications for now. 4. reflux : stable - continue nexium.  Sherilee Smotherman D, PA-C 08/09/2011, 7:56 AM

## 2011-08-09 NOTE — Progress Notes (Signed)
Patient ID: Allison Durham, female   DOB: [birth year], 45 y.o.   MRN: [id number]

## 2011-08-09 NOTE — Progress Notes (Signed)
Procedure postponed because of no platelet inhibition. Questionable compliance with antiplatelets. Discussed with patient. Will reschedule.

## 2011-08-09 NOTE — Anesthesia Preprocedure Evaluation (Addendum)
Anesthesia Evaluation  Patient identified by MRN, date of birth, ID band Patient awake    Reviewed: Allergy & Precautions, H&P , NPO status , Patient's Chart, lab work & pertinent test results, reviewed documented beta blocker date and time , Unable to perform ROS - Chart review only  Airway Mallampati: I TM Distance: >3 FB Neck ROM: Full    Dental  (+) Edentulous Upper and Dental Advisory Given   Pulmonary  clear to auscultation  Pulmonary exam normal       Cardiovascular hypertension, Pt. on medications Regular Normal    Neuro/Psych    GI/Hepatic GERD-  Controlled,  Endo/Other  Diabetes mellitus-, Well Controlled, Type 1  Renal/GU      Musculoskeletal   Abdominal (+)  Abdomen: soft. Bowel sounds: normal.  Peds  Hematology   Anesthesia Other Findings   Reproductive/Obstetrics                        Anesthesia Physical Anesthesia Plan  ASA: III  Anesthesia Plan: General   Post-op Pain Management:    Induction: Intravenous  Airway Management Planned: Oral ETT  Additional Equipment: Arterial line  Intra-op Plan:   Post-operative Plan: Extubation in OR  Informed Consent: I have reviewed the patients History and Physical, chart, labs and discussed the procedure including the risks, benefits and alternatives for the proposed anesthesia with the patient or authorized representative who has indicated his/her understanding and acceptance.   Dental advisory given  Plan Discussed with: CRNA, Surgeon and Anesthesiologist  Anesthesia Plan Comments:        Anesthesia Quick Evaluation

## 2011-08-10 MED FILL — Cefazolin Sodium For Inj 1 GM: INTRAMUSCULAR | Qty: 1 | Status: AC

## 2011-09-08 ENCOUNTER — Other Ambulatory Visit (HOSPITAL_COMMUNITY): Payer: Self-pay | Admitting: Interventional Radiology

## 2011-09-08 ENCOUNTER — Telehealth (HOSPITAL_COMMUNITY): Payer: Self-pay

## 2011-09-08 DIAGNOSIS — I729 Aneurysm of unspecified site: Secondary | ICD-10-CM

## 2011-09-08 NOTE — Telephone Encounter (Deleted)
Please note:  Patient's clearance from [facility] stated that the patient has stopped taking her blood pressure meds due to insufficient funds.  Her blood pressure was elevated at that time.

## 2011-09-11 ENCOUNTER — Telehealth (HOSPITAL_COMMUNITY): Payer: Self-pay

## 2011-09-20 ENCOUNTER — Other Ambulatory Visit: Payer: Self-pay | Admitting: Radiology

## 2011-09-21 ENCOUNTER — Other Ambulatory Visit (HOSPITAL_COMMUNITY): Payer: BC Managed Care – PPO

## 2011-09-22 ENCOUNTER — Ambulatory Visit (HOSPITAL_COMMUNITY)
Admission: RE | Admit: 2011-09-22 | Discharge: 2011-09-22 | Disposition: A | Discharge status: Home / Self Care | Payer: BC Managed Care – PPO | Source: Ambulatory Visit | Attending: Radiology | Admitting: Radiology

## 2011-09-22 ENCOUNTER — Encounter (HOSPITAL_COMMUNITY): Payer: Self-pay

## 2011-09-22 ENCOUNTER — Encounter (HOSPITAL_COMMUNITY)
Admission: RE | Admit: 2011-09-22 | Discharge: 2011-09-22 | Disposition: A | Discharge status: Home / Self Care | Payer: BC Managed Care – PPO | Source: Ambulatory Visit | Attending: Interventional Radiology | Admitting: Interventional Radiology

## 2011-09-22 ENCOUNTER — Encounter (HOSPITAL_COMMUNITY)
Admission: RE | Admit: 2011-09-22 | Discharge: 2011-09-22 | Disposition: A | Discharge status: Home / Self Care | Payer: BC Managed Care – PPO | Source: Ambulatory Visit | Attending: Radiology | Admitting: Radiology

## 2011-09-22 ENCOUNTER — Other Ambulatory Visit: Payer: Self-pay | Admitting: Radiology

## 2011-09-22 ENCOUNTER — Encounter (HOSPITAL_COMMUNITY): Payer: Self-pay | Admitting: Pharmacy Technician

## 2011-09-22 DIAGNOSIS — R51 Headache: Secondary | ICD-10-CM | POA: Insufficient documentation

## 2011-09-22 DIAGNOSIS — K219 Gastro-esophageal reflux disease without esophagitis: Secondary | ICD-10-CM | POA: Insufficient documentation

## 2011-09-22 DIAGNOSIS — E119 Type 2 diabetes mellitus without complications: Secondary | ICD-10-CM | POA: Insufficient documentation

## 2011-09-22 DIAGNOSIS — Z9889 Other specified postprocedural states: Secondary | ICD-10-CM | POA: Insufficient documentation

## 2011-09-22 DIAGNOSIS — Z538 Procedure and treatment not carried out for other reasons: Secondary | ICD-10-CM | POA: Insufficient documentation

## 2011-09-22 DIAGNOSIS — I671 Cerebral aneurysm, nonruptured: Secondary | ICD-10-CM | POA: Insufficient documentation

## 2011-09-22 DIAGNOSIS — I10 Essential (primary) hypertension: Secondary | ICD-10-CM | POA: Insufficient documentation

## 2011-09-22 HISTORY — DX: Headache: R51

## 2011-09-22 HISTORY — DX: Acute upper respiratory infection, unspecified: J06.9

## 2011-09-22 LAB — NO BLOOD PRODUCTS

## 2011-09-22 LAB — CBC
MCH: 27.6 pg (ref 26.0–34.0)
MCV: 81.6 fL (ref 78.0–100.0)
Platelets: 254 10*3/uL (ref 150–400)
RDW: 13.2 % (ref 11.5–15.5)

## 2011-09-22 LAB — COMPREHENSIVE METABOLIC PANEL
ALT: 28 U/L (ref 0–35)
Albumin: 3.7 g/dL (ref 3.5–5.2)
Alkaline Phosphatase: 62 U/L (ref 39–117)
Chloride: 106 mEq/L (ref 96–112)
GFR calc Af Amer: 44 mL/min — ABNORMAL LOW (ref >90)
Glucose, Bld: 159 mg/dL — ABNORMAL HIGH (ref 70–99)
Potassium: 5 mEq/L (ref 3.5–5.1)
Sodium: 139 mEq/L (ref 135–145)
Total Protein: 7.3 g/dL (ref 6.0–8.3)

## 2011-09-22 LAB — HCG, SERUM, QUALITATIVE: Preg, Serum: NEGATIVE

## 2011-09-22 LAB — DIFFERENTIAL
Eosinophils Absolute: 0.5 10*3/uL (ref 0.0–0.7)
Eosinophils Relative: 11 % — ABNORMAL HIGH (ref 0–5)
Lymphs Abs: 1.3 10*3/uL (ref 0.7–4.0)
Monocytes Relative: 7 % (ref 3–12)

## 2011-09-22 IMAGING — CR DG CHEST 2V
2 series · 2 of 2 positions shown · non-contrast
Comparison: [DATE]

CLINICAL DATA: Status post cerebral aneurysm embolization.
Hypertension.  Diabetes.

CHEST - 2 VIEW

[view not recorded (1 of 2)]
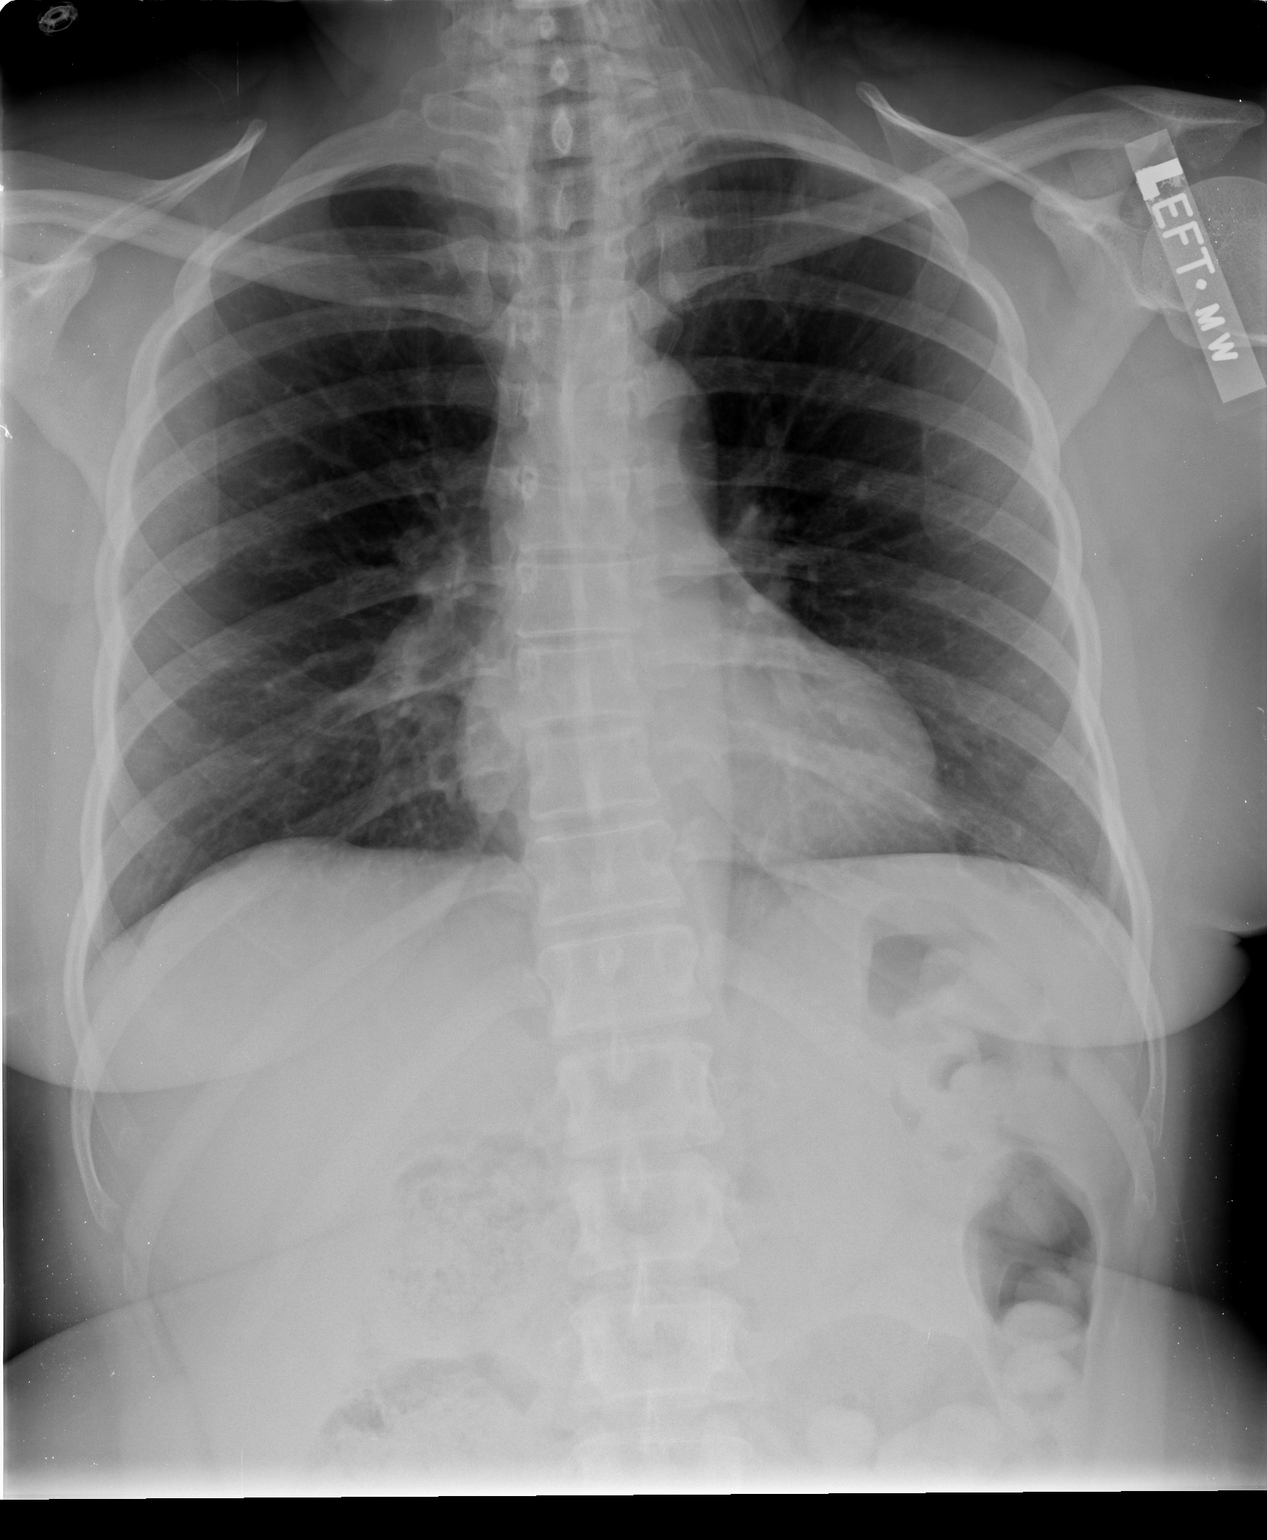

[view not recorded (2 of 2)]
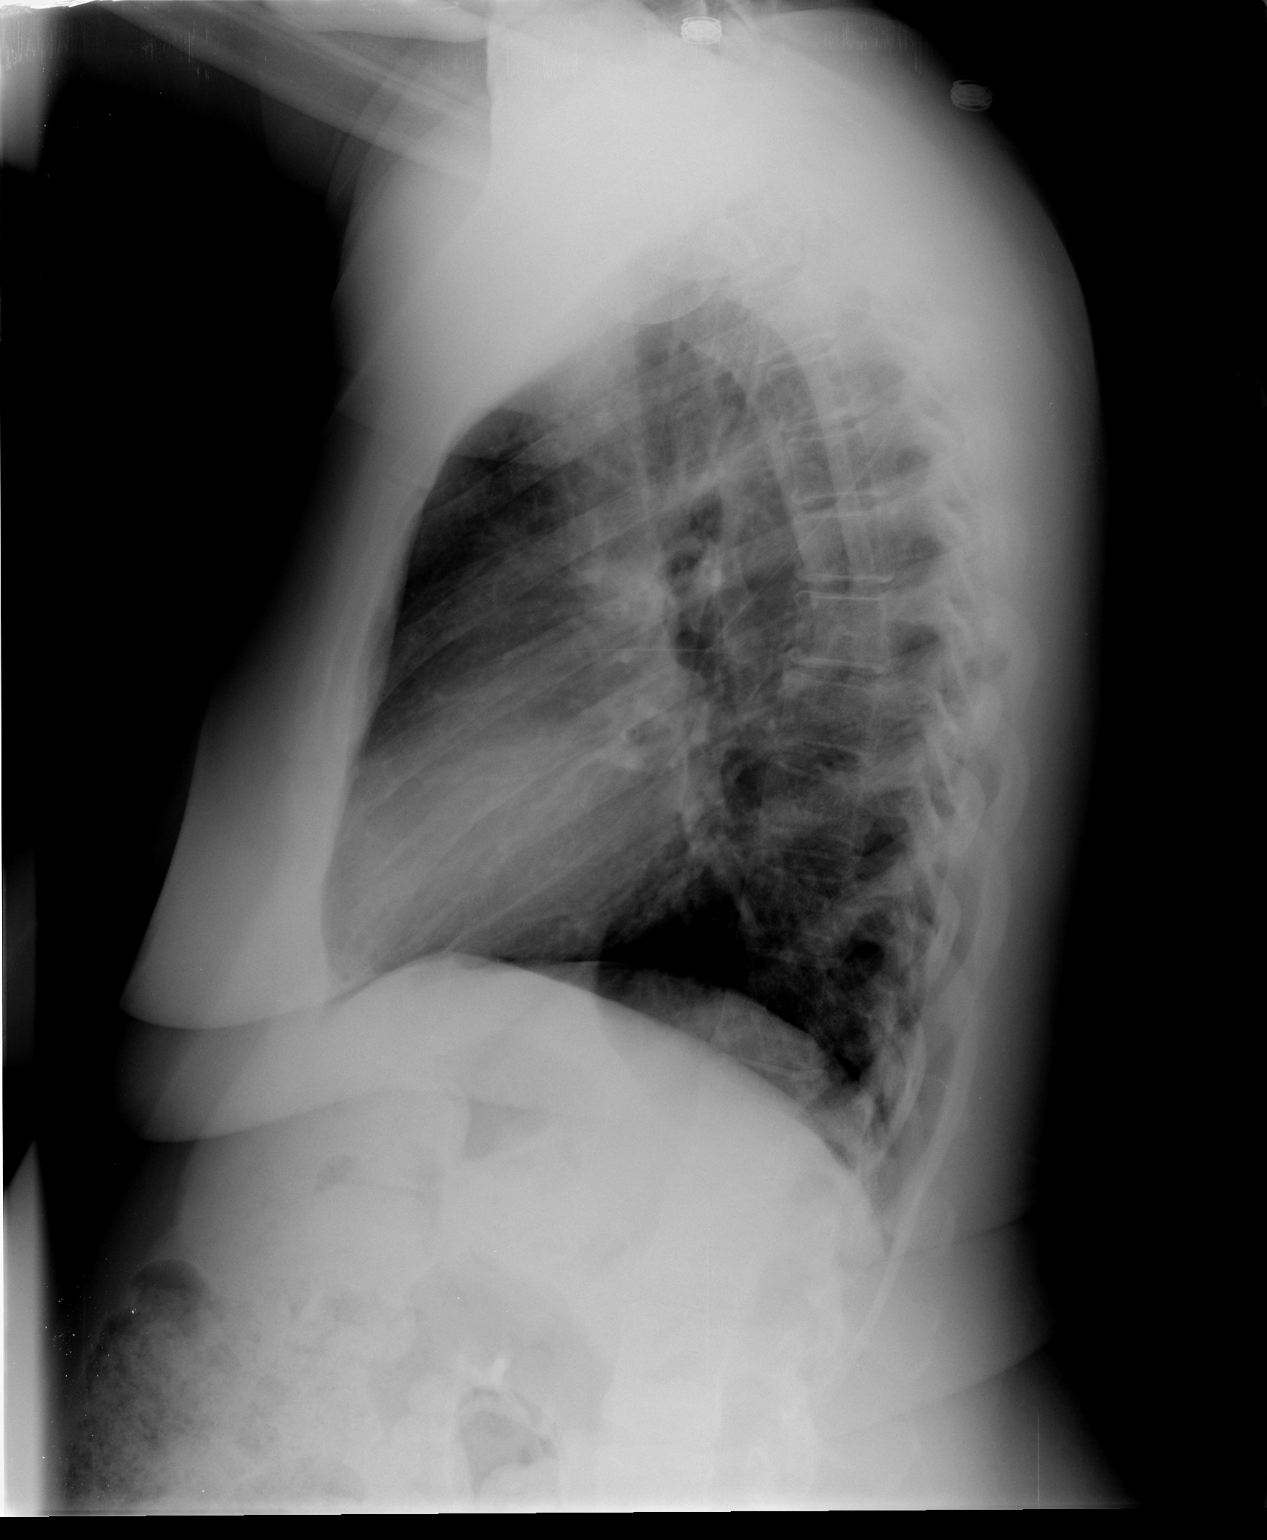

[2 of 2 positions shown; findings below may reference images not displayed]

FINDINGS: Normal heart size and vascularity.  Negative for acute
pneumonia, edema, collapse, consolidation, effusion or
pneumothorax.  Trachea midline.  No acute osseous finding.  Hair
artifact over the left supraclavicular region.
IMPRESSION: No acute chest finding.

## 2011-09-22 NOTE — Pre-Procedure Instructions (Signed)
[place]  09/22/2011   Your procedure is scheduled on:  Wednesday, JAN. 16,2013  Report to [place] at 0600 AM.  Call this number if you have problems the morning of surgery: [phone]   Remember:   Do not eat food:After Midnight.  May have clear liquids: up to 4 Hours before arrival. (2:00 AM) Clear liquids include soda, tea, black coffee, apple or grape juice, broth.  Take these medicines the morning of surgery with A SIP OF WATER: AMLODIPINE,NEXIUM, PLAVIX, ASPIRIN   Do not wear jewelry, make-up or nail polish.  Do not wear lotions, powders, or perfumes. You may wear deodorant.  Do not shave 48 hours prior to surgery.  Do not bring valuables to the hospital.  Contacts, dentures or bridgework may not be worn into surgery.  Leave suitcase in the car. After surgery it may be brought to your room.  For patients admitted to the hospital, checkout time is 11:00 AM the day of discharge.   Patients discharged the day of surgery will not be allowed to drive home.  Name and phone number of your driver: ALMERINDA HACHE [phone]  Special Instructions: CHG Shower Use Special Wash: 1/2 bottle night before surgery and 1/2 bottle morning of surgery.   Please read over the following fact sheets that you were given: Pain Booklet, Coughing and Deep Breathing and Surgical Site Infection Prevention

## 2011-09-22 NOTE — Progress Notes (Signed)
Allison Durham to review ekg.

## 2011-09-25 ENCOUNTER — Encounter (HOSPITAL_COMMUNITY): Payer: Self-pay | Admitting: Vascular Surgery

## 2011-09-25 NOTE — Consult Note (Addendum)
Anesthesia:  Patient is a 46 year old female scheduled for a cerebral arteriogram and possible left PCA aneurysm coiling/stenting on 09/27/11.  Her history includes DM2, GERD, headaches, HTN, and prior SAH secondary to ruptured left PCA s/p coiling 12/2010.  She was initially scheduled for this procedure on 08/09/11, but it was postponed on the day of the procedure because of no platelet inhibition.   I was asked to review her EKG from 12/22/10 showing SR, anterior infarct.  Overall the EKG appeared stable since 07/24/08.  CXR from 09/22/11 showed no acute findings.    Labs showed Cr of 1.57, BUN 29, glucose of 159, K 5.0, and H/H of 10.8/31.9.  She has signed a refusal for blood products other than albumin.  Coags are WNL.  Her baseline Cr appears to be 1.3-1.46 over the past year.   Addendum: 09/26/11 1200 PM  I spoke with Caffie Damme PA-C regarding labs results.  She will review with Dr. Estanislado Pandy and order additional labs for the morning of surgery if needed.  She is already suppose to get a P2Y12 on arrival.

## 2011-09-26 ENCOUNTER — Telehealth (HOSPITAL_COMMUNITY): Payer: Self-pay

## 2011-09-26 ENCOUNTER — Other Ambulatory Visit: Payer: Self-pay | Admitting: Radiology

## 2011-09-27 ENCOUNTER — Ambulatory Visit (HOSPITAL_COMMUNITY)
Admission: RE | Admit: 2011-09-27 | Discharge: 2011-09-27 | Disposition: A | Discharge status: Home / Self Care | Payer: BC Managed Care – PPO | Source: Ambulatory Visit | Attending: Interventional Radiology | Admitting: Interventional Radiology

## 2011-09-27 ENCOUNTER — Encounter (HOSPITAL_COMMUNITY): Payer: Self-pay | Admitting: *Deleted

## 2011-09-27 ENCOUNTER — Encounter (HOSPITAL_COMMUNITY): Payer: Self-pay | Admitting: Vascular Surgery

## 2011-09-27 ENCOUNTER — Encounter (HOSPITAL_COMMUNITY)
Admission: RE | Disposition: A | Discharge status: Home / Self Care | Payer: Self-pay | Source: Ambulatory Visit | Attending: Interventional Radiology

## 2011-09-27 ENCOUNTER — Encounter (HOSPITAL_COMMUNITY): Payer: Self-pay

## 2011-09-27 DIAGNOSIS — Z538 Procedure and treatment not carried out for other reasons: Secondary | ICD-10-CM | POA: Insufficient documentation

## 2011-09-27 DIAGNOSIS — I671 Cerebral aneurysm, nonruptured: Secondary | ICD-10-CM | POA: Insufficient documentation

## 2011-09-27 LAB — GLUCOSE, CAPILLARY
Glucose-Capillary: 149 mg/dL — ABNORMAL HIGH (ref 70–99)
Glucose-Capillary: 133 mg/dL — ABNORMAL HIGH (ref 70–99)

## 2011-09-27 LAB — PLATELET INHIBITION P2Y12: Platelet Function  P2Y12: 305 [PRU] (ref 194–418)

## 2011-09-27 SURGERY — RADIOLOGY WITH ANESTHESIA
Anesthesia: General

## 2011-09-27 MED ORDER — CEFAZOLIN SODIUM 1-5 GM-% IV SOLN: 1.0000 g | Freq: Once | INTRAVENOUS | Status: DC

## 2011-09-27 MED ORDER — CEFAZOLIN SODIUM 1-5 GM-% IV SOLN
INTRAVENOUS | Status: AC
  Filled 2011-09-27: qty 50

## 2011-09-27 MED ORDER — SODIUM CHLORIDE 0.9 % IV SOLN: INTRAVENOUS | Status: DC

## 2011-09-27 MED ORDER — ASPIRIN EC 325 MG PO TBEC: 325.0000 mg | DELAYED_RELEASE_TABLET | Freq: Once | ORAL | Status: DC

## 2011-09-27 MED ORDER — CLOPIDOGREL BISULFATE 75 MG PO TABS: 75.0000 mg | ORAL_TABLET | ORAL | Status: DC

## 2011-09-27 MED ORDER — ASPIRIN EC 325 MG PO TBEC
DELAYED_RELEASE_TABLET | ORAL | Status: AC
  Filled 2011-09-27: qty 1

## 2011-09-27 NOTE — Anesthesia Postprocedure Evaluation (Signed)
  Anesthesia Post-op Note  Patient: Allison Durham  Procedure(s) Performed: * No procedures listed *  Patient Location: Short Stay  Anesthesia Type: No anesthesia/case cancelled  Level of Consciousness: awake, oriented, patient cooperative and responds to stimulation  Airway and Oxygen Therapy: Patient Spontanous Breathing  Post-op Pain: none  Post-op Assessment:   Post-op Vital Signs:  Complications:

## 2011-09-27 NOTE — H&P (Signed)
Allison Durham is an 46 y.o. female.   Chief Complaint: Established pt of Dr Marylee Floras. Left Posterior Communicating Artery Aneurysm coiling performed 12/21/10 F/U angio 10/31 showed new outpouching of same area and recanalization at neck  HPI: scheduled now for Cerebral arteriogram and probable stent/coil of L PCOM aneurysm  Past Medical History  Diagnosis Date  . Diabetes mellitus   . Cerebral aneurysm   . GERD (gastroesophageal reflux disease)   . Recurrent upper respiratory infection (URI)     1 -2 months ago, z-pack  . Headache     2 per week  . Hypertension     PT. SEES DR. Nancy Fetter AT [facility]    Past Surgical History  Procedure Date  . Cerebral aneurysm repair     No family history on file. Social History:  reports that she has never smoked. She does not have any smokeless tobacco history on file. She reports that she drinks alcohol. She reports that she does not use illicit drugs.  Allergies: No Known Allergies  Medications Prior to Admission  Medication Dose Route Frequency Provider Last Rate Last Dose  . 0.9 %  sodium chloride infusion   Intravenous Continuous D Rowe Robert, PA      . aspirin EC tablet 325 mg  325 mg Oral Once D Kevin Allred, PA      . ceFAZolin (ANCEF) 1-5 GM-% IVPB           . ceFAZolin (ANCEF) IVPB 1 g/50 mL premix  1 g Intravenous Once D Rowe Robert, PA      . clopidogrel (PLAVIX) tablet 75 mg  75 mg Oral 60 min Pre-Op D Rowe Robert, PA      . DISCONTD: aspirin EC 325 MG tablet            Medications Prior to Admission  Medication Sig Dispense Refill  . amLODipine (NORVASC) 10 MG tablet Take 10 mg by mouth daily.        Marland Kitchen aspirin 325 MG EC tablet Take 325 mg by mouth daily. Starts taking 4 days prior to procedure      . cholecalciferol (VITAMIN D) 1000 UNITS tablet Take 1,000 Units by mouth daily.        . clopidogrel (PLAVIX) 75 MG tablet Take 75 mg by mouth daily. Starts 4 days prior to procedure      . esomeprazole  (NEXIUM) 40 MG capsule Take 40 mg by mouth daily before breakfast.        . ibuprofen (ADVIL,MOTRIN) 200 MG tablet Take 600 mg by mouth every 6 (six) hours as needed. For pain       . insulin aspart protamine-insulin aspart (NOVOLOG 70/30) (70-30) 100 UNIT/ML injection Inject 20-25 Units into the skin 2 (two) times daily with a meal. 25 in am and 20 in pm       . lisinopril-hydrochlorothiazide (PRINZIDE,ZESTORETIC) 20-12.5 MG per tablet Take 1 tablet by mouth 2 (two) times daily.       . metFORMIN (GLUCOPHAGE) 500 MG tablet Take 500 mg by mouth 2 (two) times daily with a meal.        . Travoprost, BAK Free, (TRAVATAN) 0.004 % SOLN ophthalmic solution Place 1 drop into both eyes at bedtime.          Results for orders placed during the hospital encounter of 09/27/11 (from the past 48 hour(s))  PLATELET INHIBITION P2Y12     Status: Normal   Collection Time   09/27/11  6:08 AM      Component Value Range Comment   Platelet Function  P2Y12 305  194 - 418 (PRU)   GLUCOSE, CAPILLARY     Status: Abnormal   Collection Time   09/27/11  7:10 AM      Component Value Range Comment   Glucose-Capillary 149 (*) 70 - 99 (mg/dL)    No results found.  Review of Systems  Constitutional: Negative for fever and chills.  Eyes: Negative for blurred vision.  Respiratory: Negative for cough.   Cardiovascular: Negative for chest pain.  Gastrointestinal: Negative for nausea, vomiting and abdominal pain.  Neurological: Negative for dizziness and headaches.    Last menstrual period 05/26/2011. Physical Exam  Constitutional: She is oriented to person, place, and time. She appears well-developed and well-nourished.  HENT:  Head: Normocephalic.  Eyes: EOM are normal.  Neck: Normal range of motion. Neck supple.  Cardiovascular: Normal rate, regular rhythm and normal heart sounds.   No murmur heard. Respiratory: Effort normal and breath sounds normal. She has no wheezes.  GI: Soft. Bowel sounds are normal. There  is no tenderness.  Musculoskeletal: Normal range of motion.  Neurological: She is alert and oriented to person, place, and time.  Skin: Skin is warm and dry.     Assessment/Plan Left posterior communicating artery aneurysm coiling performed 12/21/10. Follow up angiogram 10/31 showed saccular outpouching in same area and mild recanalization at neck. Scheduled for Cerebral arteriogram and possible stent/coil of this new area at L COM. Pt aware of procedure benefits and risks and agreeable to proceed. Consent signed. If Intervention is performed pt is aware she will be admitted overnight to Neuro ICU.  Roselynne Lortz A 09/27/2011, 7:22 AM

## 2011-09-27 NOTE — Progress Notes (Addendum)
Patient procedure was cancelled today... Pt came to short stay a to get dressed and go home... Her sister will drive her home... Discharge instructions given by Jannifer Franklin from radiology .   Vital signs upon discharge are 98.4  76  18  133/92  100   %

## 2011-09-27 NOTE — Preoperative (Signed)
Beta Blockers   Reason not to administer Beta Blockers:Not Applicable 

## 2011-09-27 NOTE — Transfer of Care (Signed)
Immediate Anesthesia Transfer of Care Note  Patient: Allison Durham  Procedure(s) Performed: * No procedures listed *  Patient Location: Short Stay  Anesthesia Type:Case Cancelled  Level of Consciousness: awake, oriented and patient cooperative  Airway & Oxygen Therapy: Patient Spontanous Breathing  Post-op Assessment: Return to Short Stay and D/c Home  Post vital signs:  Filed Vitals:   09/27/11 0621  BP: 127/84  Pulse: 75  Temp: 36.1 C  Resp: 18    Complications: NO Anesthesia

## 2011-10-04 ENCOUNTER — Telehealth (HOSPITAL_COMMUNITY): Payer: Self-pay

## 2011-10-12 ENCOUNTER — Other Ambulatory Visit (HOSPITAL_COMMUNITY): Payer: Self-pay | Admitting: Physician Assistant

## 2011-10-12 ENCOUNTER — Encounter (HOSPITAL_COMMUNITY): Payer: Self-pay | Admitting: Pharmacy Technician

## 2011-10-19 ENCOUNTER — Other Ambulatory Visit: Payer: Self-pay | Admitting: Radiology

## 2011-10-23 ENCOUNTER — Encounter (HOSPITAL_COMMUNITY)
Admission: RE | Admit: 2011-10-23 | Discharge: 2011-10-23 | Disposition: A | Discharge status: Home / Self Care | Payer: BC Managed Care – PPO | Source: Ambulatory Visit | Attending: Interventional Radiology | Admitting: Interventional Radiology

## 2011-10-23 LAB — HCG, SERUM, QUALITATIVE: Preg, Serum: NEGATIVE

## 2011-10-23 LAB — DIFFERENTIAL
Basophils Absolute: 0 10*3/uL (ref 0.0–0.1)
Basophils Relative: 1 % (ref 0–1)
Eosinophils Absolute: 0.4 10*3/uL (ref 0.0–0.7)
Monocytes Absolute: 0.2 10*3/uL (ref 0.1–1.0)
Neutro Abs: 2.2 10*3/uL (ref 1.7–7.7)
Neutrophils Relative %: 51 % (ref 43–77)

## 2011-10-23 LAB — CBC
HCT: 32.2 % — ABNORMAL LOW (ref 36.0–46.0)
MCHC: 34.2 g/dL (ref 30.0–36.0)
RDW: 13.2 % (ref 11.5–15.5)

## 2011-10-23 LAB — PROTIME-INR: INR: 1.02 (ref 0.00–1.49)

## 2011-10-23 LAB — BASIC METABOLIC PANEL
BUN: 29 mg/dL — ABNORMAL HIGH (ref 6–23)
Creatinine, Ser: 1.64 mg/dL — ABNORMAL HIGH (ref 0.50–1.10)
GFR calc Af Amer: 42 mL/min — ABNORMAL LOW (ref >90)
GFR calc non Af Amer: 36 mL/min — ABNORMAL LOW (ref >90)

## 2011-10-23 NOTE — Pre-Procedure Instructions (Signed)
[place]  10/23/2011   Your procedure is scheduled on:  Oct 25, 2011 (Wed)  Report to [place] at 6:30 AM.  Call this number if you have problems the morning of surgery: [phone]   Remember:   Do not eat food:After Midnight.  May have clear liquids: up to 4 Hours before arrival.  Clear liquids include soda, tea, black coffee, apple or grape juice, broth.  Take these medicines the morning of surgery with A SIP OF WATER: norvasc,nexium, take aspirin and brilinta as directed by office   Do not wear jewelry, make-up or nail polish.  Do not wear lotions, powders, or perfumes. You may wear deodorant.  Do not shave 48 hours prior to surgery.  Do not bring valuables to the hospital.  Contacts, dentures or bridgework may not be worn into surgery.  Leave suitcase in the car. After surgery it may be brought to your room.  For patients admitted to the hospital, checkout time is 11:00 AM the day of discharge.   Patients discharged the day of surgery will not be allowed to drive home.  Name and phone number of your driver: [place] [phone]  Special Instructions: CHG Shower Use Special Wash: 1/2 bottle night before surgery and 1/2 bottle morning of surgery.   Please read over the following fact sheets that you were given: Pain Booklet and Surgical Site Infection Prevention

## 2011-10-24 MED ORDER — CEFAZOLIN SODIUM 1-5 GM-% IV SOLN: 1.0000 g | Freq: Once | INTRAVENOUS | Status: DC

## 2011-10-24 NOTE — Consult Note (Signed)
Anesthesia:  See my initial consult from 09/26/11 (under Chart Review, Notes tab).  Patient was canceled based on her P2Y12 result.  She is now rescheduled for 10/25/11.  I spoke with Caffie Damme, PA-C this morning.  She has reviewed the labs with Dr. Estanislado Pandy.  Patient does not need the P2Y12 repeated tomorrow, but he would like for her to get a 261ml NS bolus on arrival to Short Stay.  The order has been written.

## 2011-10-25 ENCOUNTER — Encounter (HOSPITAL_COMMUNITY): Payer: Self-pay | Admitting: Vascular Surgery

## 2011-10-25 ENCOUNTER — Ambulatory Visit (HOSPITAL_COMMUNITY)
Admission: RE | Admit: 2011-10-25 | Discharge: 2011-10-25 | Disposition: A | Discharge status: Home / Self Care | Payer: BC Managed Care – PPO | Source: Ambulatory Visit | Attending: Interventional Radiology | Admitting: Interventional Radiology

## 2011-10-25 ENCOUNTER — Ambulatory Visit (HOSPITAL_COMMUNITY): Payer: BC Managed Care – PPO | Admitting: Vascular Surgery

## 2011-10-25 ENCOUNTER — Encounter (HOSPITAL_COMMUNITY)
Admission: RE | Disposition: A | Discharge status: Home / Self Care | Payer: Self-pay | Source: Ambulatory Visit | Attending: Interventional Radiology

## 2011-10-25 ENCOUNTER — Encounter (HOSPITAL_COMMUNITY): Payer: Self-pay

## 2011-10-25 VITALS — BP 121/77 | HR 62 | Temp 98.3°F | Resp 18

## 2011-10-25 DIAGNOSIS — I729 Aneurysm of unspecified site: Secondary | ICD-10-CM

## 2011-10-25 DIAGNOSIS — K08109 Complete loss of teeth, unspecified cause, unspecified class: Secondary | ICD-10-CM | POA: Insufficient documentation

## 2011-10-25 DIAGNOSIS — E119 Type 2 diabetes mellitus without complications: Secondary | ICD-10-CM | POA: Insufficient documentation

## 2011-10-25 DIAGNOSIS — R944 Abnormal results of kidney function studies: Secondary | ICD-10-CM | POA: Insufficient documentation

## 2011-10-25 DIAGNOSIS — Z01812 Encounter for preprocedural laboratory examination: Secondary | ICD-10-CM | POA: Insufficient documentation

## 2011-10-25 DIAGNOSIS — K219 Gastro-esophageal reflux disease without esophagitis: Secondary | ICD-10-CM | POA: Insufficient documentation

## 2011-10-25 DIAGNOSIS — R51 Headache: Secondary | ICD-10-CM | POA: Insufficient documentation

## 2011-10-25 DIAGNOSIS — I10 Essential (primary) hypertension: Secondary | ICD-10-CM | POA: Insufficient documentation

## 2011-10-25 DIAGNOSIS — I671 Cerebral aneurysm, nonruptured: Secondary | ICD-10-CM | POA: Insufficient documentation

## 2011-10-25 DIAGNOSIS — Z01818 Encounter for other preprocedural examination: Secondary | ICD-10-CM | POA: Insufficient documentation

## 2011-10-25 LAB — POCT I-STAT, CHEM 8
BUN: 35 mg/dL — ABNORMAL HIGH (ref 6–23)
Calcium, Ion: 1.31 mmol/L (ref 1.12–1.32)
Chloride: 113 mEq/L — ABNORMAL HIGH (ref 96–112)
Creatinine, Ser: 1.4 mg/dL — ABNORMAL HIGH (ref 0.50–1.10)
Glucose, Bld: 157 mg/dL — ABNORMAL HIGH (ref 70–99)
Potassium: 4.7 mEq/L (ref 3.5–5.1)

## 2011-10-25 LAB — GLUCOSE, CAPILLARY
Glucose-Capillary: 146 mg/dL — ABNORMAL HIGH (ref 70–99)
Glucose-Capillary: 132 mg/dL — ABNORMAL HIGH (ref 70–99)
Glucose-Capillary: 117 mg/dL — ABNORMAL HIGH (ref 70–99)

## 2011-10-25 IMAGING — XA IR CAROTID  INTERNAL HEAD/NECK  LEFT (MS)
1 series · 14 of 24 positions shown · IV contrast (IODINE)
Comparison: [DATE].

CLINICAL DATA: Patient with headaches..  Previous history of
endovascularly treated ruptured left posterior  communicating
artery region aneurysm.

Examination: Left common carotid arteriogram.

[Series 300: neuro · 14 of 42 slices shown]
[im 1/42]
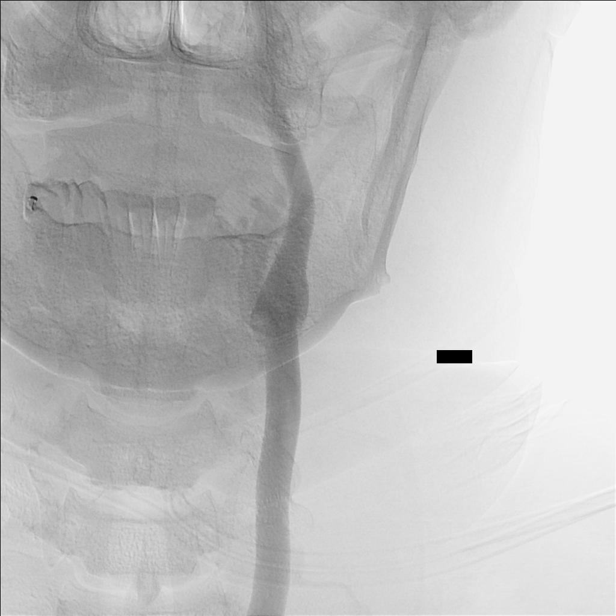
[im 4/42]
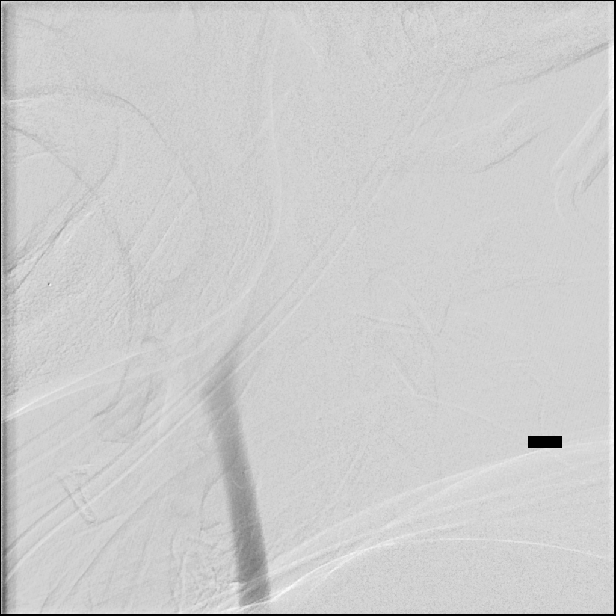
[im 8/42]
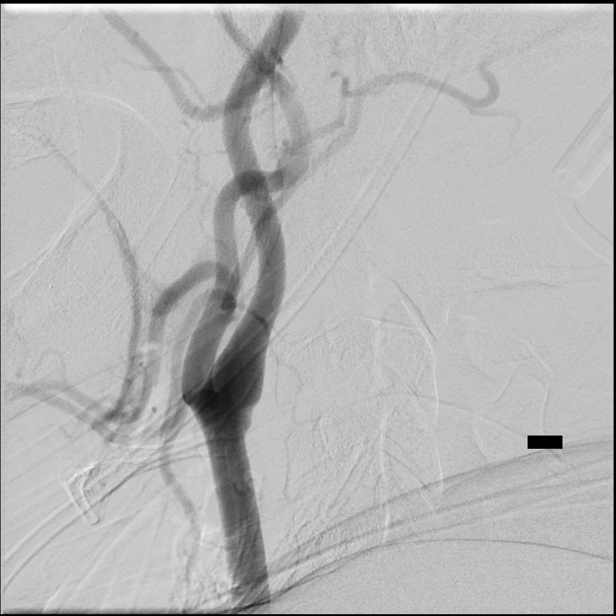
[im 11/42]
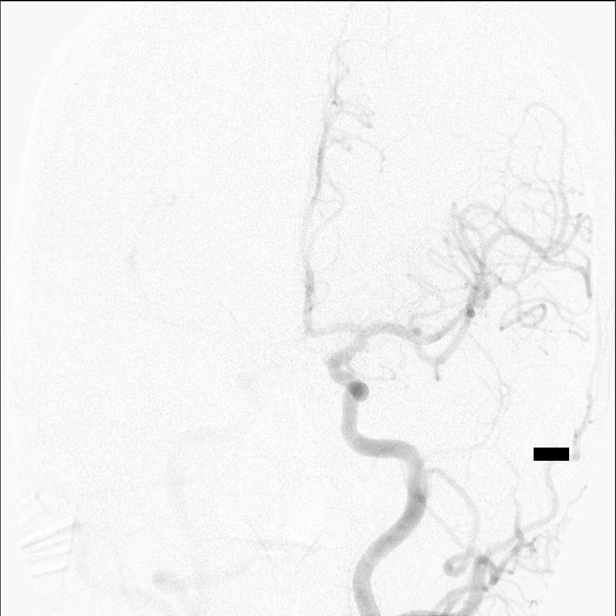
[im 13/42]
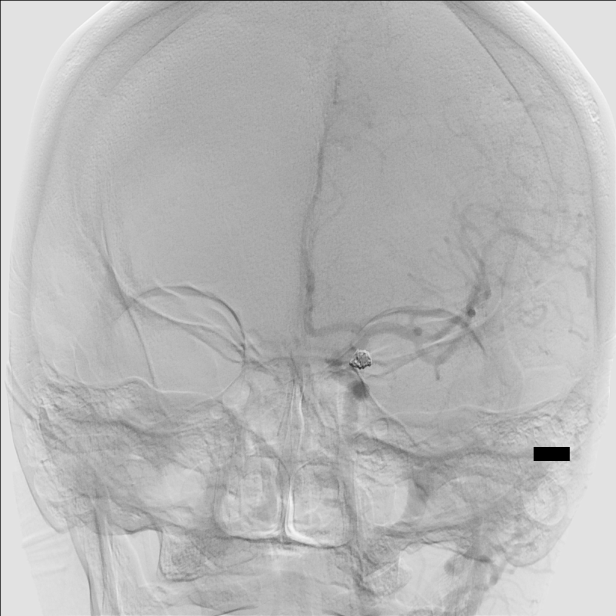
[im 17/42]
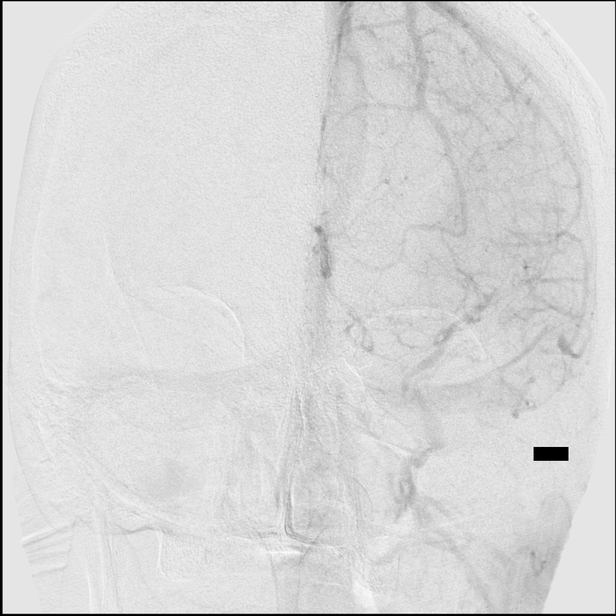
[im 20/42]
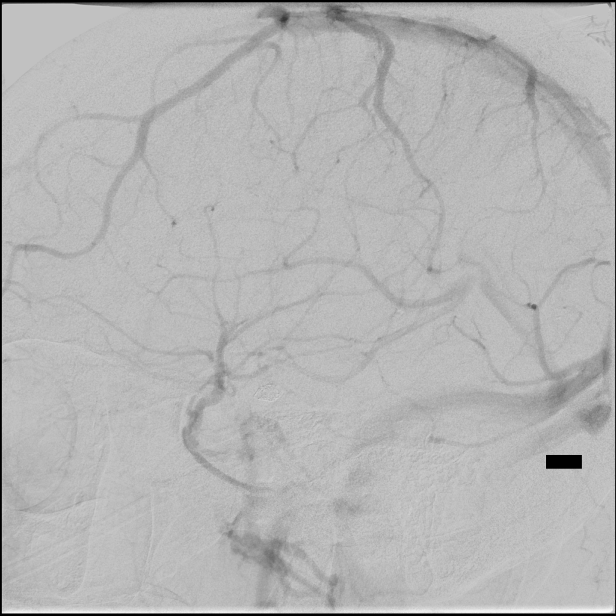
[im 22/42]
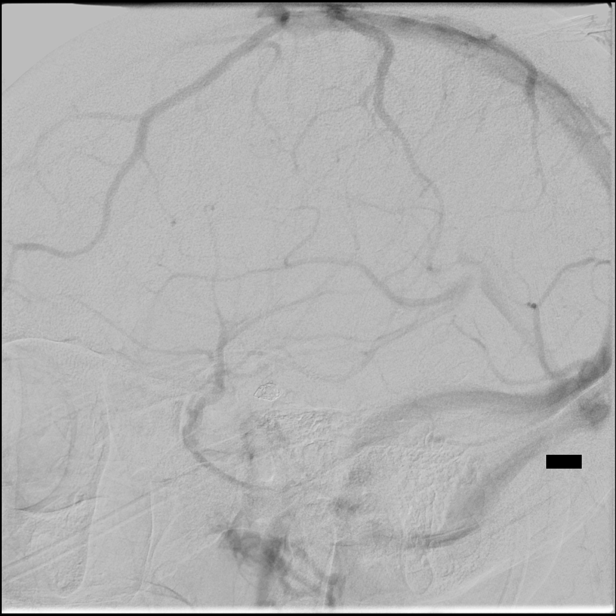
[im 25/42]
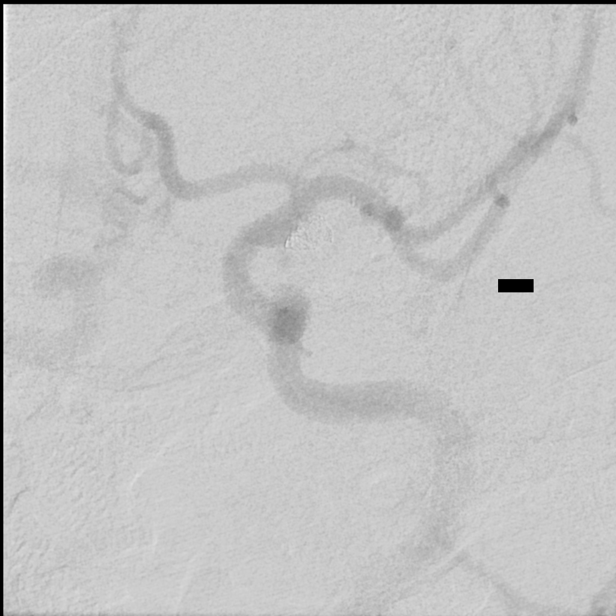
[im 29/42]
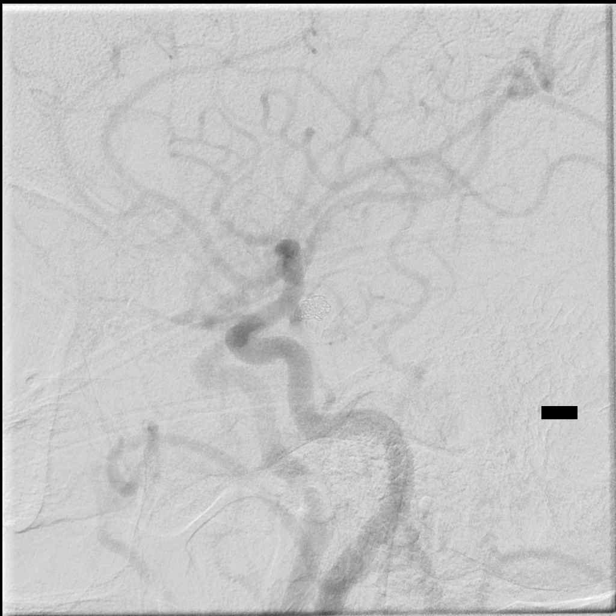
[im 33/42]
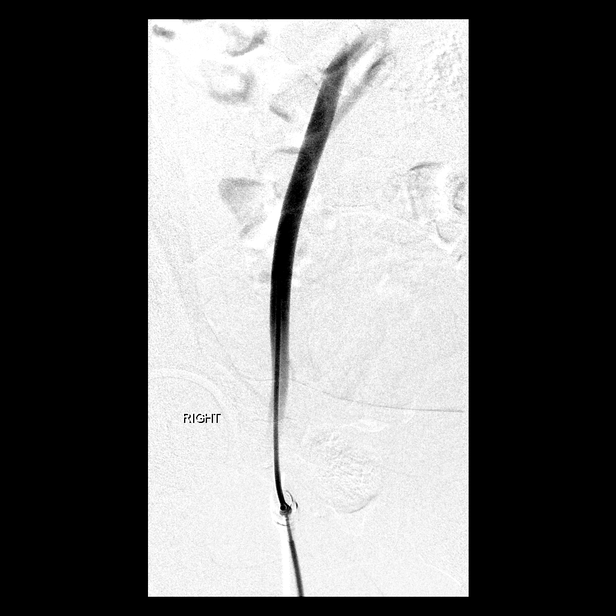
[im 34/42]
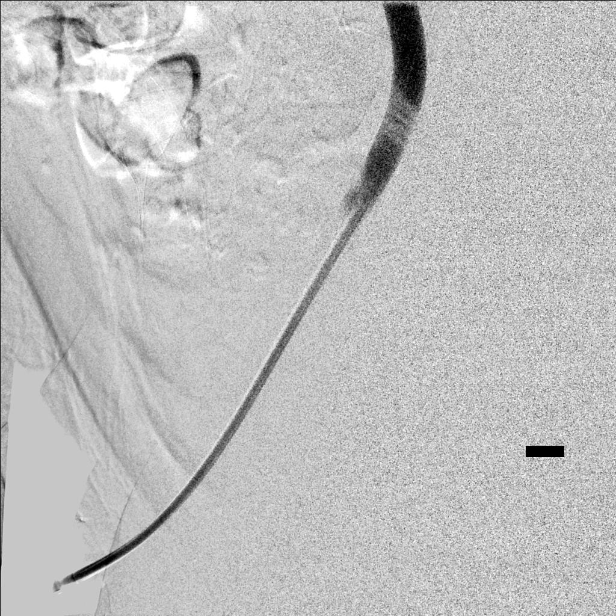
[im 38/42]
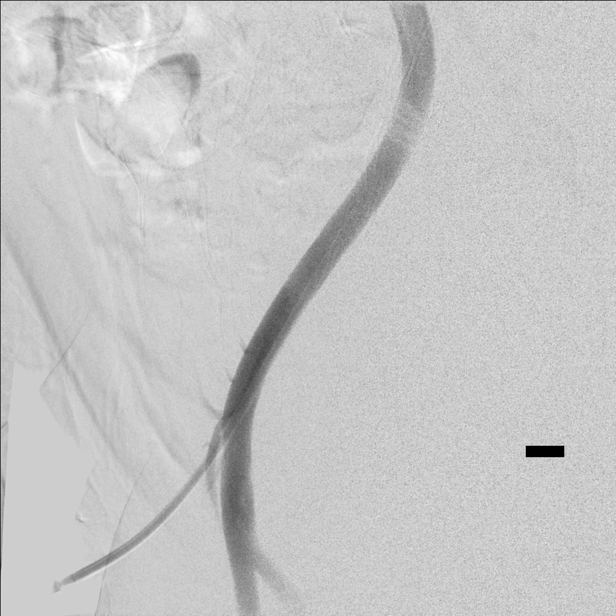
[im 42/42]
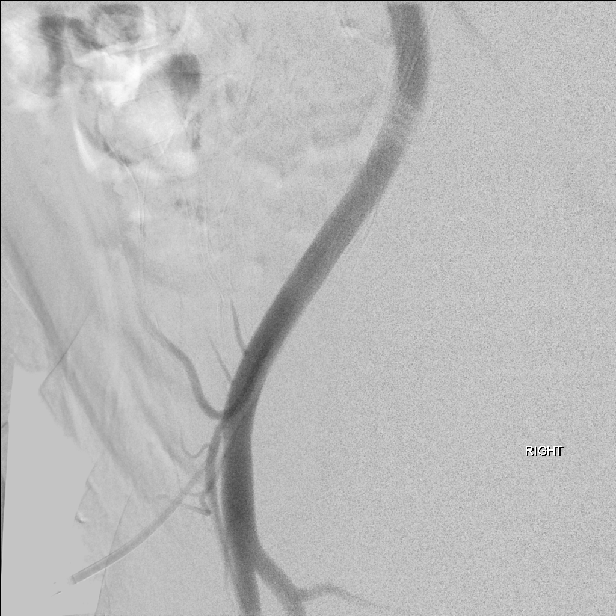

[14 of 24 positions shown; findings below may reference images not displayed]

Following full explanation of procedure along with the potential
associated complications, an informed witnessed consent was
obtained.

The right groin was prepped and draped in the usual sterile
fashion.  Thereafter using modified Seldinger technique, a
transfemoral access into the right common femoral artery was
obtained without difficulty.  Over a 0.035 guidewire, 5-French
Pinnacle sheath was inserted.  Through this and also over a
inch guidewire, a 5-French  JB1 catheter was advanced to the aortic
arch region and selectively positioned in the left common carotid
artery.

There were no acute complications.

The patient tolerated procedure well.

Medications utilized: Versed 2 mg IV.

Fentanyl  50 mcg IV. Apressoline  5 mg IV.
FINDINGS: The left common carotid arteriogram demonstrates the left external
carotid artery and its major branch to be normal.

The left internal carotid at the bulb to the cranial skull base
opacifies normally.  The petrous, the cavernous and the
supraclinoid segments are normal.

A left PCOM is noted opacifying the left posterior cerebral
distribution.

Arising just inferior to this and laterally is a small saccular
component measuring approximately 4 mm representing the neck
remnant of the previously endovascularly treated left PCOM artery
aneurysm.

This remains unchanged compared to the examination.

The left middle and the left anterior cerebral arteries opacify
normally into capillary and venous phases.

IMPRESSION.

1.Stable approximately 4 mm left PCOM region neck remnant  of the
previously endovascularly treated left posterior communicating
artery region aneurysm.

In view of the patient's apparently worsening BUN  and creatinine
levels and despite the fluid bolus, it was decided to postpone
treatment of the neck remnant at this time.

This was explained to the patient and the patient's family. The
patient will return for followup in approximately four weeks' time.
Also the patient was advised to obtain nephrology evaluation
regarding her renal dysfunction, prior to treatment of the neck
remnant.

## 2011-10-25 SURGERY — RADIOLOGY WITH ANESTHESIA
Anesthesia: General

## 2011-10-25 MED ORDER — IOHEXOL 300 MG/ML  SOLN
100.0000 mL | Freq: Once | INTRAMUSCULAR | Status: AC | PRN
  Administered 2011-10-25: 25 mL via INTRAVENOUS

## 2011-10-25 MED ORDER — ASPIRIN EC 325 MG PO TBEC: 325.0000 mg | DELAYED_RELEASE_TABLET | ORAL | Status: DC

## 2011-10-25 MED ORDER — SODIUM CHLORIDE 0.9 % IV SOLN: INTRAVENOUS | Status: DC

## 2011-10-25 MED ORDER — ONDANSETRON HCL 4 MG/2ML IJ SOLN: 4.0000 mg | Freq: Once | INTRAMUSCULAR | Status: DC | PRN

## 2011-10-25 MED ORDER — MEPERIDINE HCL 25 MG/ML IJ SOLN: 6.2500 mg | INTRAMUSCULAR | Status: DC | PRN

## 2011-10-25 MED ORDER — SODIUM CHLORIDE 0.9 % IV BOLUS (SEPSIS)
250.0000 mL | Freq: Once | INTRAVENOUS | Status: AC
  Administered 2011-10-25: 250 mL via INTRAVENOUS

## 2011-10-25 MED ORDER — MIDAZOLAM HCL 5 MG/5ML IJ SOLN
INTRAMUSCULAR | Status: DC | PRN
  Administered 2011-10-25: 2 mg via INTRAVENOUS

## 2011-10-25 MED ORDER — NIMODIPINE 30 MG PO CAPS
60.0000 mg | ORAL_CAPSULE | ORAL | Status: AC
  Administered 2011-10-25: 60 mg via ORAL
  Filled 2011-10-25 (×2): qty 2

## 2011-10-25 MED ORDER — HYDROMORPHONE HCL PF 1 MG/ML IJ SOLN: 0.2500 mg | INTRAMUSCULAR | Status: DC | PRN

## 2011-10-25 MED ORDER — LABETALOL HCL 5 MG/ML IV SOLN
INTRAVENOUS | Status: DC | PRN
  Administered 2011-10-25 (×3): 5 mg via INTRAVENOUS

## 2011-10-25 MED ORDER — LACTATED RINGERS IV SOLN
INTRAVENOUS | Status: DC | PRN
  Administered 2011-10-25: 08:00:00 via INTRAVENOUS

## 2011-10-25 MED ORDER — CEFAZOLIN SODIUM 1-5 GM-% IV SOLN
INTRAVENOUS | Status: AC
  Filled 2011-10-25: qty 50

## 2011-10-25 MED ORDER — MORPHINE SULFATE 2 MG/ML IJ SOLN: 0.0500 mg/kg | INTRAMUSCULAR | Status: DC | PRN

## 2011-10-25 MED ORDER — FENTANYL CITRATE 0.05 MG/ML IJ SOLN
INTRAMUSCULAR | Status: DC | PRN
  Administered 2011-10-25: 50 ug via INTRAVENOUS
  Administered 2011-10-25: 25 ug via INTRAVENOUS

## 2011-10-25 NOTE — Anesthesia Procedure Notes (Signed)
Procedure Name: MAC Date/Time: 10/25/2011 9:03 AM Performed by: Lara Mulch Pre-anesthesia Checklist: Patient identified, Patient being monitored, Emergency Drugs available and Suction available Patient Re-evaluated:Patient Re-evaluated prior to inductionOxygen Delivery Method: Nasal Cannula

## 2011-10-25 NOTE — Anesthesia Preprocedure Evaluation (Addendum)
Anesthesia Evaluation  Patient identified by MRN, date of birth, ID band Patient awake    Reviewed: Allergy & Precautions, H&P , NPO status , Patient's Chart, lab work & pertinent test results, reviewed documented beta blocker date and time   Airway Mallampati: I TM Distance: >3 FB Neck ROM: Full    Dental  (+) Edentulous Upper and Edentulous Lower   Pulmonary          Cardiovascular hypertension, Pt. on medications     Neuro/Psych  Headaches,    GI/Hepatic GERD-  Controlled,  Endo/Other  Diabetes mellitus-, Oral Hypoglycemic Agents  Renal/GU      Musculoskeletal   Abdominal   Peds  Hematology   Anesthesia Other Findings   Reproductive/Obstetrics                          Anesthesia Physical Anesthesia Plan  ASA: III  Anesthesia Plan: General   Post-op Pain Management:    Induction: Intravenous  Airway Management Planned: Oral ETT  Additional Equipment: Arterial line  Intra-op Plan:   Post-operative Plan: Extubation in OR  Informed Consent: I have reviewed the patients History and Physical, chart, labs and discussed the procedure including the risks, benefits and alternatives for the proposed anesthesia with the patient or authorized representative who has indicated his/her understanding and acceptance.     Plan Discussed with: Anesthesiologist and Surgeon  Anesthesia Plan Comments:         Anesthesia Quick Evaluation

## 2011-10-25 NOTE — Transfer of Care (Signed)
Immediate Anesthesia Transfer of Care Note  Patient: Allison Durham  Procedure(s) Performed: Procedure(s) (LRB): RADIOLOGY WITH ANESTHESIA (N/A)  Patient Location: PACU  Anesthesia Type: MAC  Level of Consciousness: awake, alert , oriented and patient cooperative  Airway & Oxygen Therapy: Patient Spontanous Breathing  Post-op Assessment: Report given to PACU RN, Post -op Vital signs reviewed and stable and Patient moving all extremities  Post vital signs: Reviewed and stable  Complications: No apparent anesthesia complications

## 2011-10-25 NOTE — Progress Notes (Signed)
Patient ID: Allison Durham, female   DOB: 1966-04-23, 46 y.o.   MRN: [id number]    Pt was scheduled today for L PCOM aneurysm evaluation.  Possible additional coils and possible stent placement.   Cr 1.6 (2/11)       1.52 (2/13)  Dr Estanislado Pandy did perform cerebral arteriogram But decided pt would need to see nephrologist about increasing Creatinine Before he would safely move forward with any intervention.  Pt aware and agreeable.  To return in 4 weeks to see Dr Estanislado Pandy. Pt needs to have seen Nephrologist by then with recommendations.

## 2011-10-25 NOTE — Discharge Instructions (Signed)
1. Restart metformin on Friday  Feb 15 th   Groin Site Care Refer to this sheet in the next few weeks. These instructions provide you with information on caring for yourself after your procedure. Your caregiver may also give you more specific instructions. Your treatment has been planned according to current medical practices, but problems sometimes occur. Call your caregiver if you have any problems or questions after your procedure. HOME CARE INSTRUCTIONS  You may shower 24 hours after the procedure. Remove the bandage (dressing) and gently wash the site with plain soap and water. Gently pat the site dry.   Do not apply powder or lotion to the site.   Do not sit in a bathtub, swimming pool, or whirlpool for 5 to 7 days.   No bending, squatting, or lifting anything over 10 pounds (4.5 kg) as directed by your caregiver.   Inspect the site at least twice daily.   Do not drive home if you are discharged the same day of the procedure. Have someone else drive you.   You may drive 24 hours after the procedure unless otherwise instructed by your caregiver.  What to expect:  Any bruising will usually fade within 1 to 2 weeks.   Blood that collects in the tissue (hematoma) may be painful to the touch. It should usually decrease in size and tenderness within 1 to 2 weeks.  SEEK IMMEDIATE MEDICAL CARE IF:  You have unusual pain at the groin site or down the affected leg.   You have redness, warmth, swelling, or pain at the groin site.   You have drainage (other than a small amount of blood on the dressing).   You have chills.   You have a fever or persistent symptoms for more than 72 hours.   You have a fever and your symptoms suddenly get worse.   Your leg becomes pale, cool, tingly, or numb.   You have heavy bleeding from the site. Hold pressure on the site.  Document Released: 09/30/2010 Document Revised: 05/10/2011 Document Reviewed: 09/30/2010 [facility] Patient Information 2012  [place].

## 2011-10-25 NOTE — Anesthesia Postprocedure Evaluation (Signed)
  Anesthesia Post-op Note  Patient: Allison Durham  Procedure(s) Performed: Procedure(s) (LRB): RADIOLOGY WITH ANESTHESIA (N/A)  Patient Location: PACU  Anesthesia Type: MAC  Level of Consciousness: awake, alert , oriented and patient cooperative  Airway and Oxygen Therapy: Patient Spontanous Breathing  Post-op Pain: none  Post-op Assessment: Post-op Vital signs reviewed, Patient's Cardiovascular Status Stable, Respiratory Function Stable, Patent Airway and No signs of Nausea or vomiting  Post-op Vital Signs: Reviewed and stable  Complications: No apparent anesthesia complications

## 2011-10-25 NOTE — H&P (Signed)
Allison Durham is an 46 y.o. female.   Chief Complaint: Left posterior communicating aneurysm coil 4/12. New findings on recheck Cerebral Arteriogram 10/12 shows new outpouching at same site. Also new finding of mild recannalization at neck of previously treated aneurysm.  HPI: scheduled today for Cerebral Arteriogram and probable additional coils at previous L PCOM aneurysm and probable stent placement over new outpouching.  Past Medical History  Diagnosis Date  . Diabetes mellitus   . Cerebral aneurysm   . GERD (gastroesophageal reflux disease)   . Recurrent upper respiratory infection (URI)     1 -2 months ago, z-pack  . Headache     2 per week  . Hypertension     PT. SEES DR. Nancy Fetter AT [facility]    Past Surgical History  Procedure Date  . Cerebral aneurysm repair     History reviewed. No pertinent family history. Social History:  reports that she has never smoked. She does not have any smokeless tobacco history on file. She reports that she drinks alcohol. She reports that she does not use illicit drugs.  Allergies: No Known Allergies  Medications Prior to Admission  Medication Sig Dispense Refill  . aspirin 325 MG EC tablet Take 325 mg by mouth daily. Starts taking 4 days prior to procedure      . cholecalciferol (VITAMIN D) 1000 UNITS tablet Take 1,000 Units by mouth daily.        Marland Kitchen esomeprazole (NEXIUM) 40 MG capsule Take 40 mg by mouth daily before breakfast.        . insulin aspart protamine-insulin aspart (NOVOLOG 70/30) (70-30) 100 UNIT/ML injection Inject 20-25 Units into the skin 2 (two) times daily with a meal. 25 in am and 20 in pm       . lisinopril-hydrochlorothiazide (PRINZIDE,ZESTORETIC) 20-12.5 MG per tablet Take 1 tablet by mouth 2 (two) times daily.       . metFORMIN (GLUCOPHAGE) 500 MG tablet Take 500 mg by mouth 2 (two) times daily with a meal.        . Ticagrelor (BRILINTA) 90 MG TABS tablet Take 90 mg by mouth 2 (two) times daily. Starts 4  days before procedure      . amLODipine (NORVASC) 10 MG tablet Take 10 mg by mouth daily.         Medications Prior to Admission  Medication Dose Route Frequency Provider Last Rate Last Dose  . 0.9 %  sodium chloride infusion   Intravenous Continuous Rob Hickman, MD      . aspirin EC tablet 325 mg  325 mg Oral 60 min Pre-Op Rob Hickman, MD      . ceFAZolin (ANCEF) IVPB 1 g/50 mL premix  1 g Intravenous Once Rob Hickman, MD      . niMODipine (NIMOTOP) capsule 60 mg  60 mg Oral 60 min Pre-Op Rob Hickman, MD      . sodium chloride 0.9 % bolus 250 mL  250 mL Intravenous Once Jacinta Shoe, MD        Results for orders placed during the hospital encounter of 10/23/11 (from the past 48 hour(s))  HCG, SERUM, QUALITATIVE     Status: Normal   Collection Time   10/23/11  3:19 PM      Component Value Range Comment   Preg, Serum NEGATIVE  NEGATIVE    APTT     Status: Normal   Collection Time   10/23/11  3:19 PM  Component Value Range Comment   aPTT 29  24 - 37 (seconds)   BASIC METABOLIC PANEL     Status: Abnormal   Collection Time   10/23/11  3:19 PM      Component Value Range Comment   Sodium 139  135 - 145 (mEq/L)    Potassium 4.7  3.5 - 5.1 (mEq/L)    Chloride 105  96 - 112 (mEq/L)    CO2 24  19 - 32 (mEq/L)    Glucose, Bld 101 (*) 70 - 99 (mg/dL)    BUN 29 (*) 6 - 23 (mg/dL)    Creatinine, Ser 1.64 (*) 0.50 - 1.10 (mg/dL)    Calcium 10.3  8.4 - 10.5 (mg/dL)    GFR calc non Af Amer 36 (*) >90 (mL/min)    GFR calc Af Amer 42 (*) >90 (mL/min)   CBC     Status: Abnormal   Collection Time   10/23/11  3:19 PM      Component Value Range Comment   WBC 4.2  4.0 - 10.5 (K/uL)    RBC 3.98  3.87 - 5.11 (MIL/uL)    Hemoglobin 11.0 (*) 12.0 - 15.0 (g/dL)    HCT 32.2 (*) 36.0 - 46.0 (%)    MCV 80.9  78.0 - 100.0 (fL)    MCH 27.6  26.0 - 34.0 (pg)    MCHC 34.2  30.0 - 36.0 (g/dL)    RDW 13.2  11.5 - 15.5 (%)    Platelets 252  150 - 400 (K/uL)     DIFFERENTIAL     Status: Abnormal   Collection Time   10/23/11  3:19 PM      Component Value Range Comment   Neutrophils Relative 51  43 - 77 (%)    Neutro Abs 2.2  1.7 - 7.7 (K/uL)    Lymphocytes Relative 35  12 - 46 (%)    Lymphs Abs 1.5  0.7 - 4.0 (K/uL)    Monocytes Relative 5  3 - 12 (%)    Monocytes Absolute 0.2  0.1 - 1.0 (K/uL)    Eosinophils Relative 8 (*) 0 - 5 (%)    Eosinophils Absolute 0.4  0.0 - 0.7 (K/uL)    Basophils Relative 1  0 - 1 (%)    Basophils Absolute 0.0  0.0 - 0.1 (K/uL)   PLATELET INHIBITION P2Y12     Status: Abnormal   Collection Time   10/23/11  3:19 PM      Component Value Range Comment   Platelet Function  P2Y12 61 (*) 194 - 418 (PRU)   PROTIME-INR     Status: Normal   Collection Time   10/23/11  3:19 PM      Component Value Range Comment   Prothrombin Time 13.6  11.6 - 15.2 (seconds)    INR 1.02  0.00 - 1.49    SURGICAL PCR SCREEN     Status: Normal   Collection Time   10/23/11  3:20 PM      Component Value Range Comment   MRSA, PCR NEGATIVE  NEGATIVE     Staphylococcus aureus NEGATIVE  NEGATIVE     No results found.  Review of Systems  Constitutional: Negative for fever.  Eyes: Negative for blurred vision.  Respiratory: Negative for cough.   Cardiovascular: Negative for chest pain.  Gastrointestinal: Negative for nausea, vomiting and abdominal pain.  Neurological: Negative for headaches.    Last menstrual period 04/28/2011. Physical Exam  Constitutional: She is  oriented to person, place, and time. She appears well-developed and well-nourished.  Eyes: EOM are normal.  Neck: Normal range of motion.  Cardiovascular: Normal rate, regular rhythm and normal heart sounds.   No murmur heard. Respiratory: Effort normal and breath sounds normal.  GI: Soft. Bowel sounds are normal. There is no tenderness.  Musculoskeletal: Normal range of motion.  Neurological: She is alert and oriented to person, place, and time. No cranial nerve deficit.  Coordination normal.  Skin: Skin is warm and dry.     Assessment/Plan Previous Left Posterior Communicating Aneurysm coiling 4/12.  Recheck arteriogram on 07/12/11;  shows mild recannalization at neck of this site and new outpouching of same vessel. Pt now scheduled for Cerebral Arteriogram and possible additional coils and stent placement Pt aware of procedure benefits and risks and agreeable to proceed. Pt understands if intervention is performed she will be admitted overnight in Neuro ICU.  Allison Durham A 10/25/2011, 7:19 AM

## 2011-10-25 NOTE — Preoperative (Signed)
Beta Blockers   Reason not to administer Beta Blockers:Not Applicable 

## 2011-10-25 NOTE — Procedures (Signed)
S/P Lt CCArteriogram  RT CFA approach Preliminary findings  1. Stable 4 mm Lt Pcom aneurysm neck remnant.

## 2011-10-27 ENCOUNTER — Telehealth (HOSPITAL_COMMUNITY): Payer: Self-pay | Admitting: *Deleted

## 2011-10-27 NOTE — Telephone Encounter (Signed)
Post procedure follow up call 

## 2011-11-17 ENCOUNTER — Telehealth (HOSPITAL_COMMUNITY): Payer: Self-pay

## 2011-11-17 NOTE — Telephone Encounter (Signed)
Left a message for Allison Durham to give me a call to check to see if she has had her Neph appt and received clearance to proceed

## 2011-11-20 ENCOUNTER — Ambulatory Visit (HOSPITAL_COMMUNITY): Payer: BC Managed Care – PPO

## 2011-11-23 ENCOUNTER — Telehealth (HOSPITAL_COMMUNITY): Payer: Self-pay

## 2011-11-23 NOTE — Telephone Encounter (Signed)
Spoke with Allison Durham to find out if she had seen the neph. In order to get clearance to proceed.  She advised me that she did not remember Dr. Estanislado Pandy telling her this and she would go see one and get back in touch with [place] in order to proceed

## 2011-11-28 NOTE — Telephone Encounter (Signed)
Spoke with pt about neph doctor

## 2011-11-28 NOTE — Telephone Encounter (Signed)
Contacts         Type  Contact  Phone    09/26/2011 3:16 PM  Phone (Outgoing)  Gamila, Wonders (Self)  [phone] (H)    Left Message- ADVISED PT TO DRINK PLENTY OF WATER UNTIL MIDNIGHT PER DR. Estanislado Pandy VIA MESSAGE

## 2011-11-28 NOTE — Telephone Encounter (Signed)
Contacts         Type  Contact  Phone    10/04/2011 10:30 AM  Phone (Outgoing)  Allison Durham, Allison Durham (Self)  [phone] (W)    Completed- RESCHEDULED PROCEDURE PT WANTS TO DO BLOOD WORK 2 DAYS PRIOR DUE TO MISSING SO MUCH WORK THE LAST 3 TIMES

## 2011-11-28 NOTE — Telephone Encounter (Signed)
Contacts         Type  Contact  Phone    09/08/2011 1:12 PM  Phone (Outgoing)  Allison Durham (Self)      Left Message- Left message to reschedule procedure for 09-27-11

## 2012-02-06 ENCOUNTER — Telehealth (HOSPITAL_COMMUNITY): Payer: Self-pay

## 2012-02-06 NOTE — Telephone Encounter (Signed)
Allison Durham called in to ask what exactly she needed to find out for the [place].  I advised her that Dr. Estanislado Pandy needs clearance that her kidney functions are working properly.  She also needs clearance that she can have the contrast given in order to proceed.

## 2012-05-08 ENCOUNTER — Telehealth (HOSPITAL_COMMUNITY): Payer: Self-pay

## 2012-05-08 NOTE — Telephone Encounter (Signed)
I received a call from [facility] [phone].  She was calling in regards to Allison Durham.  She stated that Allison Durham needed to have her procedure ASAP due to her deductible being met.  I asked  her if she would tell me the fax number that the report went to.  She was unable to give me the fax number.  I advised her to please fax it to the [phone] number because there have been times that reports, orders, etc. Have been sent to Dr. Bo Merino or [facility]. I stated for her to please fax it to me and I would handle getting it to Dr. Estanislado Pandy and upon his request, I will schedule the procedure.  She asked me to call her back when I received the reports.

## 2012-05-17 ENCOUNTER — Telehealth (HOSPITAL_COMMUNITY): Payer: Self-pay

## 2012-05-17 NOTE — Telephone Encounter (Signed)
I called Ms. Winkels to set up a consult per Dr. Estanislado Pandy to go over the [facility] report that we have received.  He wants her to have copies of the reports and decide at that point the next plan of treatment.

## 2012-05-20 ENCOUNTER — Other Ambulatory Visit (HOSPITAL_COMMUNITY): Payer: Self-pay | Admitting: Interventional Radiology

## 2012-05-20 DIAGNOSIS — I729 Aneurysm of unspecified site: Secondary | ICD-10-CM

## 2012-05-22 ENCOUNTER — Ambulatory Visit (HOSPITAL_COMMUNITY)
Admission: RE | Admit: 2012-05-22 | Discharge: 2012-05-22 | Disposition: A | Discharge status: Home / Self Care | Payer: BC Managed Care – PPO | Source: Ambulatory Visit | Attending: Interventional Radiology | Admitting: Interventional Radiology

## 2012-05-22 DIAGNOSIS — I729 Aneurysm of unspecified site: Secondary | ICD-10-CM

## 2012-05-27 ENCOUNTER — Telehealth (HOSPITAL_COMMUNITY): Payer: Self-pay

## 2012-05-27 NOTE — Telephone Encounter (Signed)
I faxed the consult report Dr. Chalmers Cater and Dr. Nancy Fetter

## 2012-11-20 ENCOUNTER — Telehealth (HOSPITAL_COMMUNITY): Payer: Self-pay | Admitting: Internal Medicine

## 2012-11-20 ENCOUNTER — Other Ambulatory Visit (HOSPITAL_COMMUNITY): Payer: Self-pay | Admitting: Interventional Radiology

## 2012-11-20 DIAGNOSIS — I671 Cerebral aneurysm, nonruptured: Secondary | ICD-10-CM

## 2012-12-06 ENCOUNTER — Telehealth (HOSPITAL_COMMUNITY): Payer: Self-pay | Admitting: Interventional Radiology

## 2012-12-24 ENCOUNTER — Telehealth (HOSPITAL_COMMUNITY): Payer: Self-pay | Admitting: Interventional Radiology

## 2013-01-10 ENCOUNTER — Telehealth (HOSPITAL_COMMUNITY): Payer: Self-pay | Admitting: Interventional Radiology

## 2013-01-16 ENCOUNTER — Other Ambulatory Visit: Payer: Self-pay | Admitting: Radiology

## 2013-01-20 ENCOUNTER — Encounter (HOSPITAL_COMMUNITY): Payer: Self-pay | Admitting: Pharmacy Technician

## 2013-01-23 ENCOUNTER — Encounter (HOSPITAL_COMMUNITY): Payer: Self-pay

## 2013-01-23 ENCOUNTER — Ambulatory Visit (HOSPITAL_COMMUNITY)
Admission: RE | Admit: 2013-01-23 | Discharge: 2013-01-23 | Disposition: A | Discharge status: Home / Self Care | Payer: BC Managed Care – PPO | Source: Ambulatory Visit | Attending: Interventional Radiology | Admitting: Interventional Radiology

## 2013-01-23 DIAGNOSIS — I671 Cerebral aneurysm, nonruptured: Secondary | ICD-10-CM | POA: Insufficient documentation

## 2013-01-23 DIAGNOSIS — N183 Chronic kidney disease, stage 3 unspecified: Secondary | ICD-10-CM | POA: Insufficient documentation

## 2013-01-23 DIAGNOSIS — Z5309 Procedure and treatment not carried out because of other contraindication: Secondary | ICD-10-CM | POA: Insufficient documentation

## 2013-01-23 DIAGNOSIS — Z7982 Long term (current) use of aspirin: Secondary | ICD-10-CM | POA: Insufficient documentation

## 2013-01-23 DIAGNOSIS — Z79899 Other long term (current) drug therapy: Secondary | ICD-10-CM | POA: Insufficient documentation

## 2013-01-23 DIAGNOSIS — E1129 Type 2 diabetes mellitus with other diabetic kidney complication: Secondary | ICD-10-CM | POA: Insufficient documentation

## 2013-01-23 DIAGNOSIS — Z09 Encounter for follow-up examination after completed treatment for conditions other than malignant neoplasm: Secondary | ICD-10-CM | POA: Insufficient documentation

## 2013-01-23 DIAGNOSIS — Z794 Long term (current) use of insulin: Secondary | ICD-10-CM | POA: Insufficient documentation

## 2013-01-23 DIAGNOSIS — I129 Hypertensive chronic kidney disease with stage 1 through stage 4 chronic kidney disease, or unspecified chronic kidney disease: Secondary | ICD-10-CM | POA: Insufficient documentation

## 2013-01-23 DIAGNOSIS — Z9889 Other specified postprocedural states: Secondary | ICD-10-CM | POA: Insufficient documentation

## 2013-01-23 LAB — CBC WITH DIFFERENTIAL/PLATELET
Basophils Absolute: 0 10*3/uL (ref 0.0–0.1)
Basophils Relative: 0 % (ref 0–1)
Eosinophils Relative: 8 % — ABNORMAL HIGH (ref 0–5)
HCT: 31.1 % — ABNORMAL LOW (ref 36.0–46.0)
MCHC: 34.4 g/dL (ref 30.0–36.0)
Monocytes Absolute: 0.2 10*3/uL (ref 0.1–1.0)
Neutro Abs: 2.2 10*3/uL (ref 1.7–7.7)
RDW: 13.5 % (ref 11.5–15.5)

## 2013-01-23 LAB — BASIC METABOLIC PANEL
BUN: 41 mg/dL — ABNORMAL HIGH (ref 6–23)
CO2: 20 mEq/L (ref 19–32)
Creatinine, Ser: 1.97 mg/dL — ABNORMAL HIGH (ref 0.50–1.10)
GFR calc Af Amer: 33 mL/min — ABNORMAL LOW (ref >90)
GFR calc non Af Amer: 29 mL/min — ABNORMAL LOW (ref >90)
Potassium: 4.8 mEq/L (ref 3.5–5.1)
Sodium: 141 mEq/L (ref 135–145)

## 2013-01-23 LAB — APTT: aPTT: 28 seconds (ref 24–37)

## 2013-01-23 MED ORDER — SODIUM CHLORIDE 0.9 % IV SOLN
Freq: Once | INTRAVENOUS | Status: AC
  Administered 2013-01-23: 09:00:00 via INTRAVENOUS

## 2013-01-23 NOTE — H&P (Signed)
Chief Complaint: "I'm here for an angiogram" HPI: Allison Durham is an 47 y.o. female with prior hx of coil embo of (L)Pcom art aneurysm in 2012. She has had follow up angiogram showing 35mm neck remnant. She is here for follow up angiogram. She does have a history of renal Insufficiency from her diabetes and previous angiograms and MRAs have been deferred. She has been asymptomatic. She states she saw her Nephrologist Dr. Florene Glen a few weeks ago and was told her kidney function is stable. PMHx and meds reviewed. She otherwise feels well, no recent illness or fevers.  Past Medical History:  Past Medical History  Diagnosis Date  . Diabetes mellitus   . Cerebral aneurysm   . GERD (gastroesophageal reflux disease)   . Recurrent upper respiratory infection (URI)     1 -2 months ago, z-pack  . Headache     2 per week  . Hypertension     PT. SEES DR. Nancy Fetter AT [facility]    Past Surgical History:  Past Surgical History  Procedure Laterality Date  . Cerebral aneurysm repair      Family History: No family history on file.  Social History:  reports that she has never smoked. She does not have any smokeless tobacco history on file. She reports that  drinks alcohol. She reports that she does not use illicit drugs.  Allergies: No Known Allergies  Medications: amLODipine (NORVASC) 10 MG tablet (Taking) Sig - Route: Take 10 mg by mouth daily. - Oral Class: Historical Med aspirin 325 MG EC tablet (Taking) Sig - Route: Take 325 mg by mouth daily as needed for pain. - Oral Class: Historical Med Number of times this order has been changed since signing: 3 [address] cholecalciferol (VITAMIN D) 1000 UNITS tablet (Taking) Sig - Route: Take 1,000 Units by mouth 2 (two) times daily. - Oral Class: Historical Med Number of times this order has been changed since signing: 2 Order [address] ferrous sulfate 325 (65 FE) MG tablet (Taking) Sig - Route: Take 325 mg by mouth 2 (two) times  daily. - Oral Class: Historical Med insulin aspart protamine-insulin aspart (NOVOLOG 70/30) (70-30) 100 UNIT/ML injection (Taking) Sig - Route: Inject 40 Units into the skin 2 (two) times daily with a meal. - Subcutaneous Class: Historical Med Number of times this order has been changed since signing: 2 Order [address] lisinopril-hydrochlorothiazide (PRINZIDE,ZESTORETIC) 20-12.5 MG per tablet (Taking) Sig - Route: Take 1 tablet by mouth 2 (two) times daily. - Oral Class: Historical Med Number of times this order has been changed since signing: 2 Order [address] Multiple Vitamin (MULTIVITAMIN WITH MINERALS) TABS (Taking) Sig - Route: Take 1 tablet by mouth daily. - Oral Class: Historical Med omeprazole (PRILOSEC) 20 MG capsule (Taking) Sig - Route: Take 20 mg by mouth daily. - Oral Class: Historical Med terbinafine (LAMISIL) 250 MG tablet (Taking) Sig - Route: Take 250 mg by mouth daily. - Oral Class: Historical Med Travoprost, BAK Free, (TRAVATAN) 0.004 % SOLN ophthalmic solution (Taking) Sig - Route: Place 1 drop into both eyes at bedtime.    Please HPI for pertinent positives, otherwise complete 10 system ROS negative.  Physical Exam: Blood pressure 142/88, pulse 79, temperature 98 F (36.7 C), temperature source Oral, resp. rate 20, height 5\' 2"  (1.575 m), weight 186 lb (84.369 kg), SpO2 100.00%. Body mass index is 34.01 kg/(m^2).   General Appearance:  Alert, cooperative, no distress, appears stated age  Head:  Normocephalic, without obvious  abnormality, atraumatic  ENT: Unremarkable  Neck: Supple, symmetrical, trachea midline  Lungs:   Clear to auscultation bilaterally, no w/r/r, respirations unlabored without use of accessory muscles.  Chest Wall:  No tenderness or deformity  Heart:  Regular rate and rhythm, S1, S2 normal, no murmur, rub or gallop.   Extremities: Extremities normal, atraumatic, no cyanosis or edema  Pulses: 2+ and symmetric  Neurologic: Normal affect, no gross deficits.    Results for orders placed during the hospital encounter of 01/23/13 (from the past 48 hour(s))  GLUCOSE, CAPILLARY     Status: Abnormal   Collection Time    01/23/13  8:21 AM      Result Value Range   Glucose-Capillary 165 (*) 70 - 99 mg/dL   Comment 1 Documented in Chart     Comment 2 Notify RN    APTT     Status: None   Collection Time    01/23/13  8:27 AM      Result Value Range   aPTT 28  24 - 37 seconds  CBC WITH DIFFERENTIAL     Status: Abnormal   Collection Time    01/23/13  8:27 AM      Result Value Range   WBC 3.9 (*) 4.0 - 10.5 K/uL   RBC 3.81 (*) 3.87 - 5.11 MIL/uL   Hemoglobin 10.7 (*) 12.0 - 15.0 g/dL   HCT 31.1 (*) 36.0 - 46.0 %   MCV 81.6  78.0 - 100.0 fL   MCH 28.1  26.0 - 34.0 pg   MCHC 34.4  30.0 - 36.0 g/dL   RDW 13.5  11.5 - 15.5 %   Platelets 227  150 - 400 K/uL   Neutrophils Relative % 57  43 - 77 %   Neutro Abs 2.2  1.7 - 7.7 K/uL   Lymphocytes Relative 29  12 - 46 %   Lymphs Abs 1.1  0.7 - 4.0 K/uL   Monocytes Relative 6  3 - 12 %   Monocytes Absolute 0.2  0.1 - 1.0 K/uL   Eosinophils Relative 8 (*) 0 - 5 %   Eosinophils Absolute 0.3  0.0 - 0.7 K/uL   Basophils Relative 0  0 - 1 %   Basophils Absolute 0.0  0.0 - 0.1 K/uL  PROTIME-INR     Status: None   Collection Time    01/23/13  8:27 AM      Result Value Range   Prothrombin Time 13.9  11.6 - 15.2 seconds   INR 1.08  0.00 - 99991111  BASIC METABOLIC PANEL     Status: Abnormal   Collection Time    01/23/13  8:54 AM      Result Value Range   Sodium 141  135 - 145 mEq/L   Potassium 4.8  3.5 - 5.1 mEq/L   Chloride 109  96 - 112 mEq/L   CO2 20  19 - 32 mEq/L   Glucose, Bld 180 (*) 70 - 99 mg/dL   BUN 41 (*) 6 - 23 mg/dL   Creatinine, Ser 1.97 (*) 0.50 - 1.10 mg/dL   Calcium 9.8  8.4 - 10.5 mg/dL   GFR calc non Af Amer 29 (*) >90 mL/min   GFR calc Af Amer 33 (*) >90 mL/min   Comment:            The eGFR has been calculated     using the CKD EPI equation.     This calculation has not been  validated in all clinical     situations.     eGFR's persistently     <90 mL/min signify     possible Chronic Kidney Disease.   No results found.  Assessment/Plan (L)Pcom aneurysm neck remnant after previous coil embo in 2012 For cerebral arteriogram today Known stage III kidney disease. Cr 1.97 is higher than baseline. Dr. Estanislado Pandy to review and discuss with pt Discussed procedure, risks, complication, use of sedation  Consent signed in chart  Ascencion Dike PA-C 01/23/2013, 9:42 AM

## 2014-09-04 ENCOUNTER — Emergency Department (HOSPITAL_COMMUNITY): Payer: BC Managed Care – PPO

## 2014-09-04 ENCOUNTER — Inpatient Hospital Stay (HOSPITAL_COMMUNITY)
Admission: EM | Admit: 2014-09-04 | Discharge: 2014-09-08 | DRG: 854 | Disposition: A | Discharge status: Home / Self Care | Payer: BC Managed Care – PPO | Attending: Internal Medicine | Admitting: Internal Medicine

## 2014-09-04 DIAGNOSIS — L0390 Cellulitis, unspecified: Secondary | ICD-10-CM

## 2014-09-04 DIAGNOSIS — A419 Sepsis, unspecified organism: Secondary | ICD-10-CM | POA: Diagnosis not present

## 2014-09-04 DIAGNOSIS — E1165 Type 2 diabetes mellitus with hyperglycemia: Secondary | ICD-10-CM | POA: Diagnosis present

## 2014-09-04 DIAGNOSIS — Z7982 Long term (current) use of aspirin: Secondary | ICD-10-CM

## 2014-09-04 DIAGNOSIS — Z9889 Other specified postprocedural states: Secondary | ICD-10-CM

## 2014-09-04 DIAGNOSIS — N183 Chronic kidney disease, stage 3 unspecified: Secondary | ICD-10-CM | POA: Diagnosis present

## 2014-09-04 DIAGNOSIS — E1122 Type 2 diabetes mellitus with diabetic chronic kidney disease: Secondary | ICD-10-CM | POA: Diagnosis present

## 2014-09-04 DIAGNOSIS — E119 Type 2 diabetes mellitus without complications: Secondary | ICD-10-CM

## 2014-09-04 DIAGNOSIS — Z794 Long term (current) use of insulin: Secondary | ICD-10-CM

## 2014-09-04 DIAGNOSIS — I10 Essential (primary) hypertension: Secondary | ICD-10-CM | POA: Diagnosis present

## 2014-09-04 DIAGNOSIS — I129 Hypertensive chronic kidney disease with stage 1 through stage 4 chronic kidney disease, or unspecified chronic kidney disease: Secondary | ICD-10-CM | POA: Diagnosis present

## 2014-09-04 DIAGNOSIS — R509 Fever, unspecified: Secondary | ICD-10-CM | POA: Diagnosis not present

## 2014-09-04 DIAGNOSIS — R059 Cough, unspecified: Secondary | ICD-10-CM

## 2014-09-04 DIAGNOSIS — K219 Gastro-esophageal reflux disease without esophagitis: Secondary | ICD-10-CM | POA: Diagnosis present

## 2014-09-04 DIAGNOSIS — E669 Obesity, unspecified: Secondary | ICD-10-CM | POA: Diagnosis present

## 2014-09-04 DIAGNOSIS — R05 Cough: Secondary | ICD-10-CM

## 2014-09-04 DIAGNOSIS — L03311 Cellulitis of abdominal wall: Secondary | ICD-10-CM | POA: Diagnosis present

## 2014-09-04 DIAGNOSIS — J069 Acute upper respiratory infection, unspecified: Secondary | ICD-10-CM | POA: Diagnosis present

## 2014-09-04 DIAGNOSIS — Z6839 Body mass index (BMI) 39.0-39.9, adult: Secondary | ICD-10-CM

## 2014-09-04 DIAGNOSIS — L0291 Cutaneous abscess, unspecified: Secondary | ICD-10-CM | POA: Diagnosis present

## 2014-09-04 DIAGNOSIS — N179 Acute kidney failure, unspecified: Secondary | ICD-10-CM | POA: Diagnosis present

## 2014-09-04 DIAGNOSIS — R7989 Other specified abnormal findings of blood chemistry: Secondary | ICD-10-CM

## 2014-09-04 LAB — CBC WITH DIFFERENTIAL/PLATELET
Basophils Absolute: 0 10*3/uL (ref 0.0–0.1)
Basophils Relative: 0 % (ref 0–1)
Eosinophils Absolute: 0 10*3/uL (ref 0.0–0.7)
Eosinophils Relative: 1 % (ref 0–5)
HEMATOCRIT: 28.9 % — AB (ref 36.0–46.0)
HEMOGLOBIN: 9.7 g/dL — AB (ref 12.0–15.0)
LYMPHS ABS: 1.2 10*3/uL (ref 0.7–4.0)
LYMPHS PCT: 14 % (ref 12–46)
MCH: 26.9 pg (ref 26.0–34.0)
MCHC: 33.6 g/dL (ref 30.0–36.0)
MCV: 80.3 fL (ref 78.0–100.0)
MONO ABS: 0.5 10*3/uL (ref 0.1–1.0)
MONOS PCT: 6 % (ref 3–12)
NEUTROS ABS: 6.3 10*3/uL (ref 1.7–7.7)
NEUTROS PCT: 79 % — AB (ref 43–77)
Platelets: 211 10*3/uL (ref 150–400)
RBC: 3.6 MIL/uL — AB (ref 3.87–5.11)
RDW: 12.8 % (ref 11.5–15.5)
WBC: 8 10*3/uL (ref 4.0–10.5)

## 2014-09-04 LAB — COMPREHENSIVE METABOLIC PANEL
ALBUMIN: 3.4 g/dL — AB (ref 3.5–5.2)
ALK PHOS: 86 U/L (ref 39–117)
ALT: 24 U/L (ref 0–35)
ANION GAP: 12 (ref 5–15)
AST: 24 U/L (ref 0–37)
BILIRUBIN TOTAL: 0.6 mg/dL (ref 0.3–1.2)
BUN: 28 mg/dL — AB (ref 6–23)
CHLORIDE: 102 meq/L (ref 96–112)
CO2: 21 mmol/L (ref 19–32)
CREATININE: 2.51 mg/dL — AB (ref 0.50–1.10)
Calcium: 8.9 mg/dL (ref 8.4–10.5)
GFR, EST AFRICAN AMERICAN: 25 mL/min — AB (ref >90)
GFR, EST NON AFRICAN AMERICAN: 21 mL/min — AB (ref >90)
GLUCOSE: 235 mg/dL — AB (ref 70–99)
POTASSIUM: 4.3 mmol/L (ref 3.5–5.1)
Sodium: 135 mmol/L (ref 135–145)
Total Protein: 7 g/dL (ref 6.0–8.3)

## 2014-09-04 LAB — I-STAT CG4 LACTIC ACID, ED: Lactic Acid, Venous: 0.98 mmol/L (ref 0.5–2.2)

## 2014-09-04 IMAGING — DX DG CHEST 2V
2 series · 2 of 2 positions shown · non-contrast
Comparison: None.

CLINICAL DATA: Cough for 4 days

EXAM:
CHEST  2 VIEW

[chest pa]
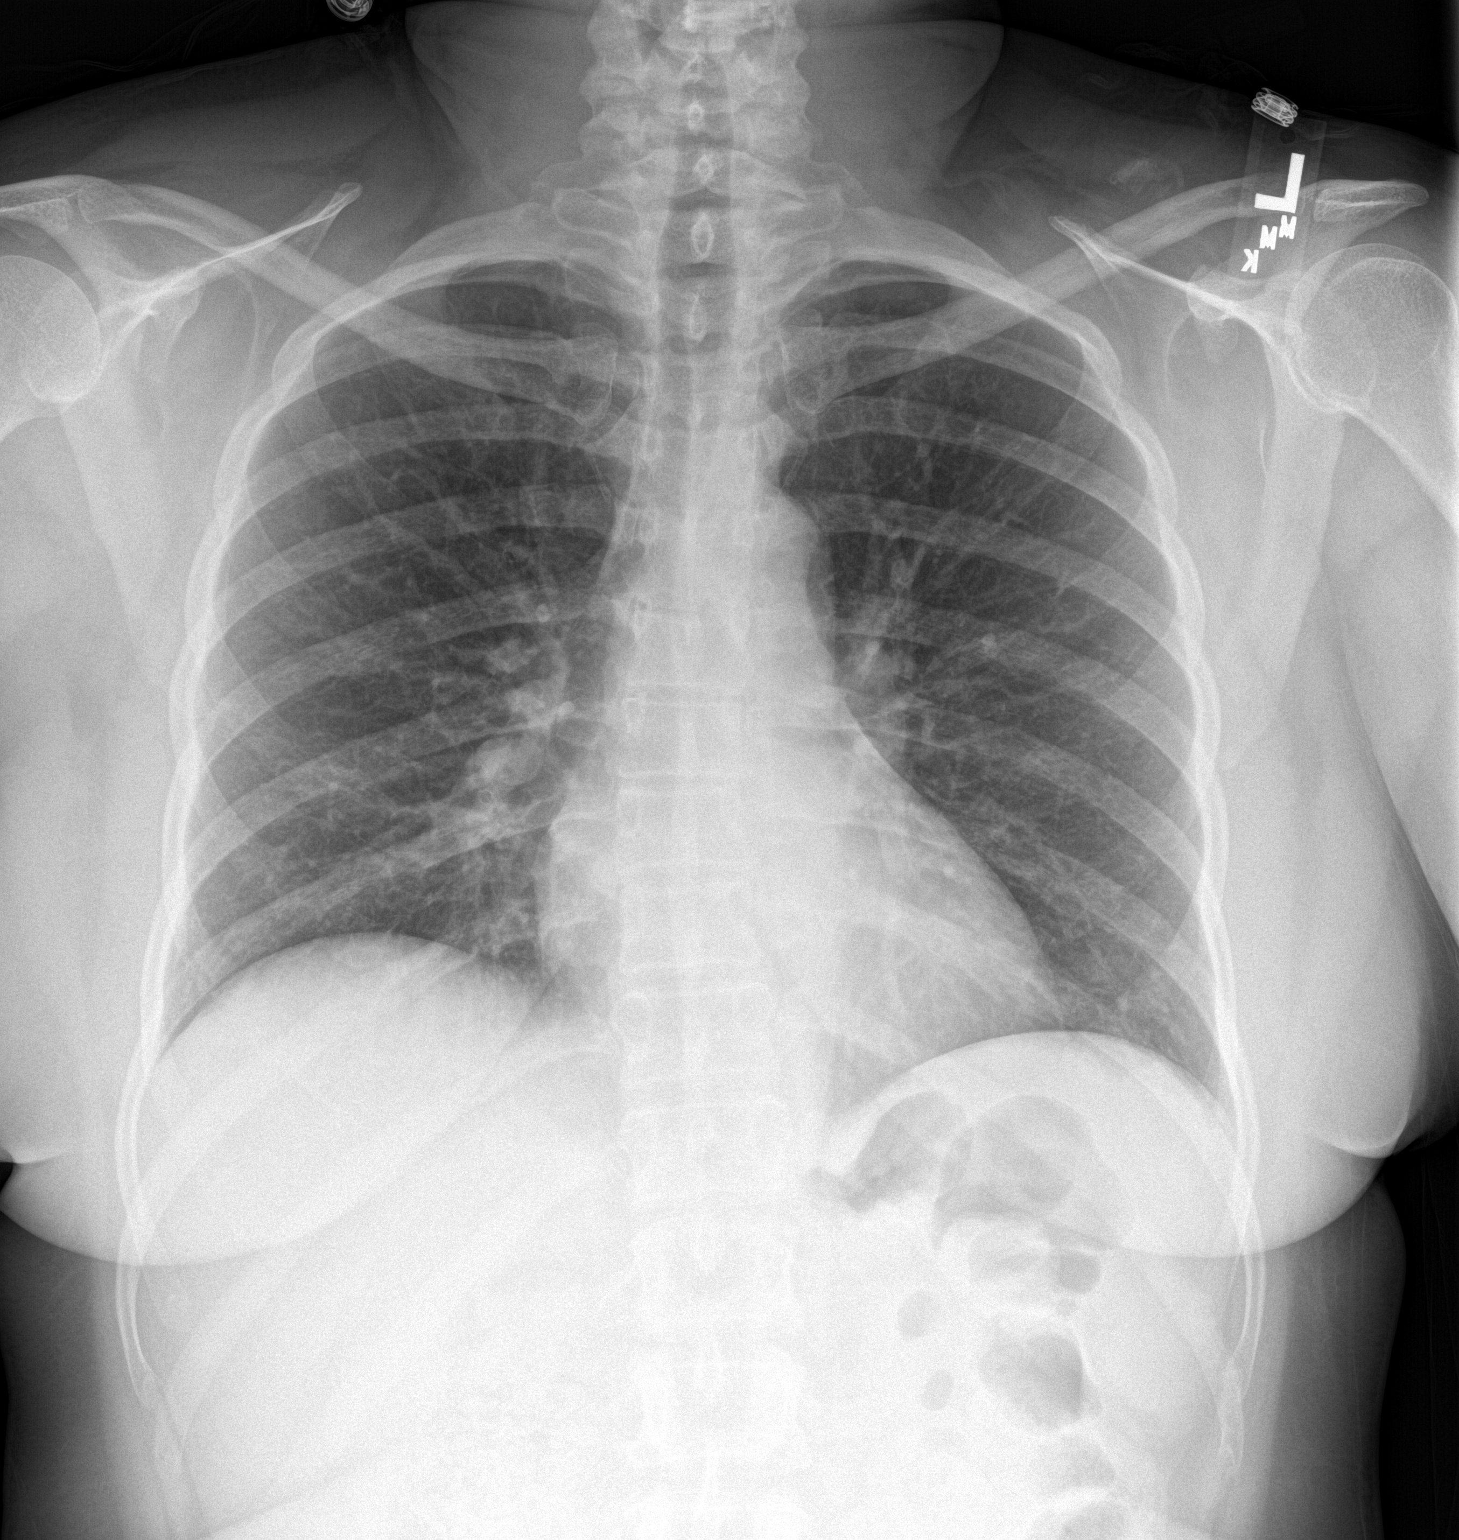

[chest lat]
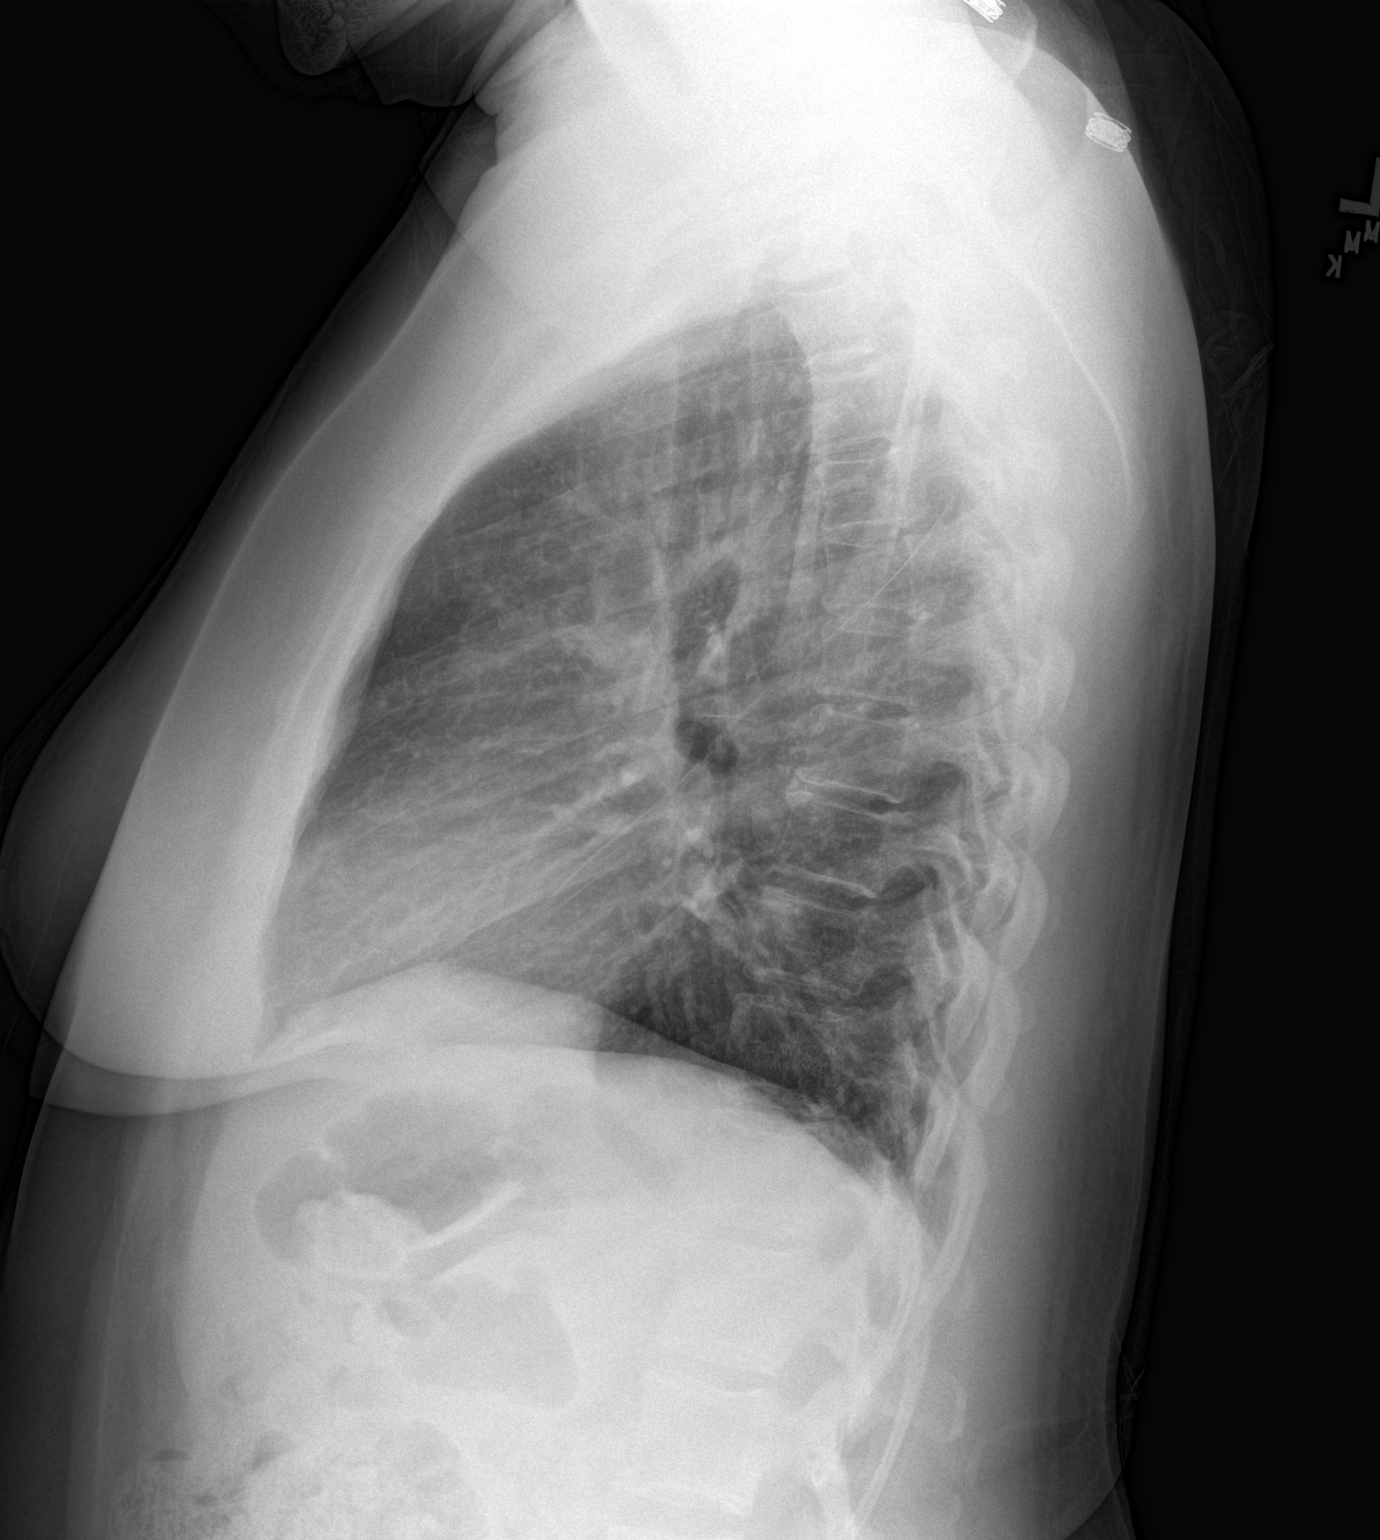

[2 of 2 positions shown; findings below may reference images not displayed]

FINDINGS: The heart size and mediastinal contours are within normal limits.
Both lungs are clear. The visualized skeletal structures are
unremarkable.
IMPRESSION: No active cardiopulmonary disease.

## 2014-09-04 MED ORDER — ACETAMINOPHEN 325 MG PO TABS
650.0000 mg | ORAL_TABLET | Freq: Once | ORAL | Status: AC
  Administered 2014-09-04: 650 mg via ORAL
  Filled 2014-09-04: qty 2

## 2014-09-04 MED ORDER — SODIUM CHLORIDE 0.9 % IV BOLUS (SEPSIS)
1000.0000 mL | Freq: Once | INTRAVENOUS | Status: AC
  Administered 2014-09-04: 1000 mL via INTRAVENOUS

## 2014-09-04 MED ORDER — LIDOCAINE-EPINEPHRINE 1 %-1:100000 IJ SOLN
10.0000 mL | Freq: Once | INTRAMUSCULAR | Status: AC
  Administered 2014-09-04: 10 mL via INTRADERMAL
  Filled 2014-09-04: qty 1

## 2014-09-04 MED ORDER — CLINDAMYCIN PHOSPHATE 600 MG/50ML IV SOLN
600.0000 mg | Freq: Once | INTRAVENOUS | Status: AC
  Administered 2014-09-04: 600 mg via INTRAVENOUS
  Filled 2014-09-04: qty 50

## 2014-09-04 NOTE — ED Notes (Signed)
Real bad cough ( non productive) and fever for x 3 days. Took some thermaflu. C/o know in lower left belly that feels warm to touch.

## 2014-09-04 NOTE — ED Provider Notes (Signed)
CSN: [id number]     Arrival date & time 09/04/14  1748 History   First MD Initiated Contact with Patient 09/04/14 1823     Chief Complaint  Patient presents with  . Fever  . Cough  . Abdominal Pain     (Consider location/radiation/quality/duration/timing/severity/associated sxs/prior Treatment) Patient is a 48 y.o. female presenting with fever, cough, and abdominal pain.  Fever Temp source:  Subjective Severity:  Moderate Onset quality:  Gradual Duration:  3 days Timing:  Constant Progression:  Worsening Chronicity:  New Relieved by:  Nothing Worsened by:  Nothing tried Associated symptoms: congestion, cough, headaches and rash (left lower abdomen)   Associated symptoms: no chest pain, no chills, no confusion, no diarrhea, no dysuria, no nausea, no sore throat and no vomiting   Risk factors: sick contacts (son with uri symptoms)  Immunosuppression: diabetes.   Cough Associated symptoms: fever, headaches and rash (left lower abdomen)   Associated symptoms: no chest pain, no chills, no shortness of breath and no sore throat   Abdominal Pain Associated symptoms: cough and fever   Associated symptoms: no chest pain, no chills, no diarrhea, no dysuria, no nausea, no shortness of breath, no sore throat and no vomiting     Past Medical History  Diagnosis Date  . Diabetes mellitus   . Cerebral aneurysm   . GERD (gastroesophageal reflux disease)   . Recurrent upper respiratory infection (URI)     1 -2 months ago, z-pack  . Headache(784.0)     2 per week  . Hypertension     PT. SEES DR. Nancy Fetter AT [facility]   Past Surgical History  Procedure Laterality Date  . Cerebral aneurysm repair     No family history on file. History  Substance Use Topics  . Smoking status: Never Smoker   . Smokeless tobacco: Not on file  . Alcohol Use: Yes     Comment: occasional   OB History    No data available     Review of Systems  Constitutional: Positive for fever.  Negative for chills.  HENT: Positive for congestion. Negative for sore throat.   Eyes: Negative for visual disturbance.  Respiratory: Positive for cough. Negative for shortness of breath.   Cardiovascular: Negative for chest pain.  Gastrointestinal: Positive for abdominal pain (over area of cellulitis). Negative for nausea, vomiting and diarrhea.  Genitourinary: Negative for dysuria and difficulty urinating.  Musculoskeletal: Negative for back pain and neck pain.  Skin: Positive for rash (left lower abdomen).  Neurological: Positive for headaches. Negative for syncope.  Psychiatric/Behavioral: Negative for confusion.      Allergies  Review of patient's allergies indicates no known allergies.  Home Medications   Prior to Admission medications   Medication Sig Start Date End Date Taking? Authorizing Provider  amLODipine (NORVASC) 10 MG tablet Take 10 mg by mouth daily.     Yes Historical Provider, MD  aspirin 325 MG EC tablet Take 325 mg by mouth daily as needed for pain.    Yes Historical Provider, MD  cholecalciferol (VITAMIN D) 1000 UNITS tablet Take 1,000 Units by mouth 2 (two) times daily.    Yes Historical Provider, MD  ferrous sulfate 325 (65 FE) MG tablet Take 325 mg by mouth 2 (two) times daily.   Yes Historical Provider, MD  insulin aspart protamine-insulin aspart (NOVOLOG 70/30) (70-30) 100 UNIT/ML injection Inject 40 Units into the skin 2 (two) times daily with a meal.    Yes Historical Provider, MD  lisinopril-hydrochlorothiazide (PRINZIDE,ZESTORETIC) 20-12.5 MG per tablet Take 1 tablet by mouth 2 (two) times daily.    Yes Historical Provider, MD  Multiple Vitamin (MULTIVITAMIN WITH MINERALS) TABS Take 1 tablet by mouth daily.   Yes Historical Provider, MD  omeprazole (PRILOSEC) 20 MG capsule Take 20 mg by mouth daily.   Yes Historical Provider, MD  terbinafine (LAMISIL) 250 MG tablet Take 250 mg by mouth daily.   Yes Historical Provider, MD  Travoprost, BAK Free, (TRAVATAN)  0.004 % SOLN ophthalmic solution Place 1 drop into both eyes at bedtime.   Yes Historical Provider, MD   BP 166/79 mmHg  Pulse 94  Temp(Src) 100.5 F (38.1 C) (Oral)  Resp 16  SpO2 98% Physical Exam  Constitutional: She is oriented to person, place, and time. She appears well-developed and well-nourished. No distress.  HENT:  Head: Normocephalic and atraumatic.  Eyes: Conjunctivae and EOM are normal.  Neck: Normal range of motion.  Cardiovascular: Normal rate, regular rhythm, normal heart sounds and intact distal pulses.  Exam reveals no gallop and no friction rub.   No murmur heard. Pulmonary/Chest: Effort normal and breath sounds normal. No respiratory distress. She has no wheezes. She has no rales.  Abdominal: Soft. She exhibits no distension. There is tenderness (around area of cellulitis). There is no guarding.  6cmx6cm area of induration with about 8cmx8cm area of erythema and warmth  Musculoskeletal: She exhibits no edema or tenderness.  Neurological: She is alert and oriented to person, place, and time.  Skin: Skin is warm and dry. No rash noted. She is not diaphoretic. No erythema.  Nursing note and vitals reviewed.   ED Course  INCISION AND DRAINAGE Date/Time: 09/04/2014 11:54 PM Performed by: Alvino Chapel Authorized by: Alvino Chapel Consent: Verbal consent obtained. Risks and benefits: risks, benefits and alternatives were discussed Required items: required blood products, implants, devices, and special equipment available Patient identity confirmed: arm band and verbally with patient Time out: Immediately prior to procedure a "time out" was called to verify the correct patient, procedure, equipment, support staff and site/side marked as required. Type: abscess Body area: trunk Location details: abdomen Anesthesia: local infiltration Local anesthetic: lidocaine 1% with epinephrine Anesthetic total: 5 ml Patient sedated: no Scalpel size:  11 Incision type: single straight and  elliptical Complexity: simple Drainage: purulent and  bloody Drainage amount: copious Wound treatment: wound left open Packing material: 1/4 in iodoform gauze Patient tolerance: Patient tolerated the procedure well with no immediate complications   (including critical care time) Labs Review Labs Reviewed  CBC WITH DIFFERENTIAL - Abnormal; Notable for the following:    RBC 3.60 (*)    Hemoglobin 9.7 (*)    HCT 28.9 (*)    Neutrophils Relative % 79 (*)    All other components within normal limits  COMPREHENSIVE METABOLIC PANEL - Abnormal; Notable for the following:    Glucose, Bld 235 (*)    BUN 28 (*)    Creatinine, Ser 2.51 (*)    Albumin 3.4 (*)    GFR calc non Af Amer 21 (*)    GFR calc Af Amer 25 (*)    All other components within normal limits  CULTURE, BLOOD (ROUTINE X 2)  CULTURE, BLOOD (ROUTINE X 2)  I-STAT CG4 LACTIC ACID, ED    Imaging Review Dg Chest 2 View  09/04/2014   CLINICAL DATA:  Cough for 4 days  EXAM: CHEST  2 VIEW  COMPARISON:  None.  FINDINGS: The heart size and  mediastinal contours are within normal limits. Both lungs are clear. The visualized skeletal structures are unremarkable.  IMPRESSION: No active cardiopulmonary disease.   Electronically Signed   By: Maryclare Bean M.D.   On: 09/04/2014 20:13     EKG Interpretation None      MDM   Final diagnoses:  Cough   48 year old female with a history of hypertension, diabetes, coiled aneurysm, presents with concern of fever, cough, swelling and erythema over the abdomen for 3 days.  Patient injects insulin into the abdomen. A chest x-ray was ordered to evaluate for pneumonia and was within normal limits.  Pt also notes headache with coughing, however denies thunderclap HA, has normal neurologic exam and given history of other concerns/fever/skin infxn feel HA is likely in setting of fever and doubt SAH.  Normal mental status and neck movement and doubt meningitis.   Given cough and nasal congestion with sick contact feel these symptoms are likely secondary to viral URI.   Given presence of fever and abdominal soft tissue infection blood cx were ordered and clindamycin was given.  An ultrasound was performed of the area of cellulitis which showed 2.75x2.75.  CBC and CMP showed hgb 9.7, Cr of 2.51. Patient reports CKD stage II with unknown baseline Cr, and given GFR today of 25 feel this likely represents acute kidney injury by history.  Lactic acid was negative.  Abscess was drained using local anesthetic with copius amounts of purulent fluid and was packed with iodoform packing. Recommend wound check in 2 days.  Given concern of cellulitis/abscess with fever and likely acute AKI in diabetic will admit for observation and IV abx.   Alvino Chapel, MD 09/05/14 [id number]  Virgel Manifold, MD 09/10/14 1341

## 2014-09-04 NOTE — ED Notes (Signed)
Incision covered with abdominal pad and taped down at the corners. Per MD Schlossman.

## 2014-09-04 NOTE — ED Notes (Signed)
MD at bedside. 

## 2014-09-05 DIAGNOSIS — Z9889 Other specified postprocedural states: Secondary | ICD-10-CM

## 2014-09-05 DIAGNOSIS — N179 Acute kidney failure, unspecified: Secondary | ICD-10-CM

## 2014-09-05 DIAGNOSIS — Z6839 Body mass index (BMI) 39.0-39.9, adult: Secondary | ICD-10-CM | POA: Diagnosis not present

## 2014-09-05 DIAGNOSIS — L03311 Cellulitis of abdominal wall: Secondary | ICD-10-CM | POA: Diagnosis present

## 2014-09-05 DIAGNOSIS — E119 Type 2 diabetes mellitus without complications: Secondary | ICD-10-CM

## 2014-09-05 DIAGNOSIS — E669 Obesity, unspecified: Secondary | ICD-10-CM

## 2014-09-05 DIAGNOSIS — N183 Chronic kidney disease, stage 3 unspecified: Secondary | ICD-10-CM | POA: Diagnosis present

## 2014-09-05 DIAGNOSIS — K219 Gastro-esophageal reflux disease without esophagitis: Secondary | ICD-10-CM | POA: Diagnosis present

## 2014-09-05 DIAGNOSIS — Z794 Long term (current) use of insulin: Secondary | ICD-10-CM | POA: Diagnosis not present

## 2014-09-05 DIAGNOSIS — J069 Acute upper respiratory infection, unspecified: Secondary | ICD-10-CM | POA: Diagnosis present

## 2014-09-05 DIAGNOSIS — E1122 Type 2 diabetes mellitus with diabetic chronic kidney disease: Secondary | ICD-10-CM | POA: Diagnosis present

## 2014-09-05 DIAGNOSIS — A419 Sepsis, unspecified organism: Principal | ICD-10-CM

## 2014-09-05 DIAGNOSIS — I10 Essential (primary) hypertension: Secondary | ICD-10-CM

## 2014-09-05 DIAGNOSIS — L0390 Cellulitis, unspecified: Secondary | ICD-10-CM

## 2014-09-05 DIAGNOSIS — E1129 Type 2 diabetes mellitus with other diabetic kidney complication: Secondary | ICD-10-CM

## 2014-09-05 DIAGNOSIS — I129 Hypertensive chronic kidney disease with stage 1 through stage 4 chronic kidney disease, or unspecified chronic kidney disease: Secondary | ICD-10-CM | POA: Diagnosis present

## 2014-09-05 DIAGNOSIS — E1165 Type 2 diabetes mellitus with hyperglycemia: Secondary | ICD-10-CM

## 2014-09-05 DIAGNOSIS — Z7982 Long term (current) use of aspirin: Secondary | ICD-10-CM | POA: Diagnosis not present

## 2014-09-05 DIAGNOSIS — R509 Fever, unspecified: Secondary | ICD-10-CM | POA: Diagnosis present

## 2014-09-05 DIAGNOSIS — L0291 Cutaneous abscess, unspecified: Secondary | ICD-10-CM | POA: Diagnosis present

## 2014-09-05 LAB — CBC
HCT: 27.2 % — ABNORMAL LOW (ref 36.0–46.0)
Hemoglobin: 9.1 g/dL — ABNORMAL LOW (ref 12.0–15.0)
MCH: 27.1 pg (ref 26.0–34.0)
MCHC: 33.5 g/dL (ref 30.0–36.0)
MCV: 81 fL (ref 78.0–100.0)
Platelets: 168 10*3/uL (ref 150–400)
RBC: 3.36 MIL/uL — ABNORMAL LOW (ref 3.87–5.11)
RDW: 12.9 % (ref 11.5–15.5)
WBC: 7.4 10*3/uL (ref 4.0–10.5)

## 2014-09-05 LAB — GLUCOSE, CAPILLARY
GLUCOSE-CAPILLARY: 279 mg/dL — AB (ref 70–99)
GLUCOSE-CAPILLARY: 190 mg/dL — AB (ref 70–99)
GLUCOSE-CAPILLARY: 66 mg/dL — AB (ref 70–99)
GLUCOSE-CAPILLARY: 95 mg/dL (ref 70–99)
Glucose-Capillary: 114 mg/dL — ABNORMAL HIGH (ref 70–99)

## 2014-09-05 LAB — BASIC METABOLIC PANEL
ANION GAP: 11 (ref 5–15)
BUN: 29 mg/dL — ABNORMAL HIGH (ref 6–23)
CO2: 19 mmol/L (ref 19–32)
Calcium: 8.4 mg/dL (ref 8.4–10.5)
Chloride: 104 mEq/L (ref 96–112)
Creatinine, Ser: 2.43 mg/dL — ABNORMAL HIGH (ref 0.50–1.10)
GFR calc non Af Amer: 22 mL/min — ABNORMAL LOW (ref >90)
GFR, EST AFRICAN AMERICAN: 26 mL/min — AB (ref >90)
Glucose, Bld: 295 mg/dL — ABNORMAL HIGH (ref 70–99)
POTASSIUM: 4.2 mmol/L (ref 3.5–5.1)
SODIUM: 134 mmol/L — AB (ref 135–145)

## 2014-09-05 MED ORDER — VANCOMYCIN HCL IN DEXTROSE 1-5 GM/200ML-% IV SOLN
1000.0000 mg | INTRAVENOUS | Status: DC
  Administered 2014-09-06 – 2014-09-07 (×2): 1000 mg via INTRAVENOUS
  Filled 2014-09-05 (×3): qty 200

## 2014-09-05 MED ORDER — PIPERACILLIN-TAZOBACTAM 3.375 G IVPB
3.3750 g | Freq: Three times a day (TID) | INTRAVENOUS | Status: DC
  Administered 2014-09-05 – 2014-09-07 (×10): 3.375 g via INTRAVENOUS
  Filled 2014-09-05 (×11): qty 50

## 2014-09-05 MED ORDER — INSULIN ASPART 100 UNIT/ML ~~LOC~~ SOLN: 0.0000 [IU] | Freq: Every day | SUBCUTANEOUS | Status: DC

## 2014-09-05 MED ORDER — VITAMIN D3 25 MCG (1000 UNIT) PO TABS
1000.0000 [IU] | ORAL_TABLET | Freq: Two times a day (BID) | ORAL | Status: DC
  Administered 2014-09-05 – 2014-09-08 (×7): 1000 [IU] via ORAL
  Filled 2014-09-05 (×9): qty 1

## 2014-09-05 MED ORDER — ALUM & MAG HYDROXIDE-SIMETH 200-200-20 MG/5ML PO SUSP: 30.0000 mL | Freq: Four times a day (QID) | ORAL | Status: DC | PRN

## 2014-09-05 MED ORDER — INSULIN ASPART 100 UNIT/ML ~~LOC~~ SOLN
5.0000 [IU] | Freq: Once | SUBCUTANEOUS | Status: AC
  Administered 2014-09-05: 5 [IU] via SUBCUTANEOUS

## 2014-09-05 MED ORDER — HEPARIN SODIUM (PORCINE) 5000 UNIT/ML IJ SOLN
5000.0000 [IU] | Freq: Three times a day (TID) | INTRAMUSCULAR | Status: DC
  Administered 2014-09-05 – 2014-09-08 (×11): 5000 [IU] via SUBCUTANEOUS
  Filled 2014-09-05 (×13): qty 1

## 2014-09-05 MED ORDER — HYDROMORPHONE HCL 1 MG/ML IJ SOLN
0.5000 mg | INTRAMUSCULAR | Status: DC | PRN
  Administered 2014-09-05 – 2014-09-08 (×8): 1 mg via INTRAVENOUS
  Filled 2014-09-05 (×8): qty 1

## 2014-09-05 MED ORDER — VANCOMYCIN HCL IN DEXTROSE 1-5 GM/200ML-% IV SOLN
1000.0000 mg | Freq: Once | INTRAVENOUS | Status: AC
  Administered 2014-09-05: 1000 mg via INTRAVENOUS
  Filled 2014-09-05: qty 200

## 2014-09-05 MED ORDER — AMLODIPINE BESYLATE 10 MG PO TABS
10.0000 mg | ORAL_TABLET | Freq: Every day | ORAL | Status: DC
  Administered 2014-09-05 – 2014-09-08 (×3): 10 mg via ORAL
  Filled 2014-09-05 (×4): qty 1

## 2014-09-05 MED ORDER — INSULIN ASPART 100 UNIT/ML ~~LOC~~ SOLN
0.0000 [IU] | Freq: Three times a day (TID) | SUBCUTANEOUS | Status: DC
  Administered 2014-09-05: 4 [IU] via SUBCUTANEOUS
  Administered 2014-09-06: 3 [IU] via SUBCUTANEOUS
  Administered 2014-09-06 – 2014-09-07 (×3): 4 [IU] via SUBCUTANEOUS
  Administered 2014-09-08: 3 [IU] via SUBCUTANEOUS
  Administered 2014-09-08: 4 [IU] via SUBCUTANEOUS

## 2014-09-05 MED ORDER — ACETAMINOPHEN 650 MG RE SUPP: 650.0000 mg | Freq: Four times a day (QID) | RECTAL | Status: DC | PRN

## 2014-09-05 MED ORDER — ONDANSETRON HCL 4 MG PO TABS
4.0000 mg | ORAL_TABLET | Freq: Four times a day (QID) | ORAL | Status: DC | PRN
  Administered 2014-09-06: 4 mg via ORAL
  Filled 2014-09-05: qty 1

## 2014-09-05 MED ORDER — ASPIRIN EC 325 MG PO TBEC
325.0000 mg | DELAYED_RELEASE_TABLET | Freq: Every day | ORAL | Status: DC | PRN
  Administered 2014-09-06: 325 mg via ORAL
  Filled 2014-09-05 (×2): qty 1

## 2014-09-05 MED ORDER — FERROUS SULFATE 325 (65 FE) MG PO TABS
325.0000 mg | ORAL_TABLET | Freq: Two times a day (BID) | ORAL | Status: DC
  Administered 2014-09-05 – 2014-09-07 (×6): 325 mg via ORAL
  Filled 2014-09-05 (×9): qty 1

## 2014-09-05 MED ORDER — ADULT MULTIVITAMIN W/MINERALS CH
1.0000 | ORAL_TABLET | Freq: Every day | ORAL | Status: DC
  Administered 2014-09-05 – 2014-09-07 (×3): 1 via ORAL
  Filled 2014-09-05 (×4): qty 1

## 2014-09-05 MED ORDER — SODIUM CHLORIDE 0.9 % IV SOLN
INTRAVENOUS | Status: DC
  Administered 2014-09-05 – 2014-09-06 (×2): 100 mL/h via INTRAVENOUS
  Administered 2014-09-07: 125 mL/h via INTRAVENOUS
  Administered 2014-09-07: 16:00:00 via INTRAVENOUS

## 2014-09-05 MED ORDER — HYDROCODONE-ACETAMINOPHEN 5-325 MG PO TABS: 1.0000 | ORAL_TABLET | Freq: Once | ORAL | Status: DC

## 2014-09-05 MED ORDER — LATANOPROST 0.005 % OP SOLN
1.0000 [drp] | Freq: Every day | OPHTHALMIC | Status: DC
  Administered 2014-09-05 – 2014-09-07 (×3): 1 [drp] via OPHTHALMIC
  Filled 2014-09-05: qty 2.5

## 2014-09-05 MED ORDER — INSULIN ASPART 100 UNIT/ML ~~LOC~~ SOLN: 0.0000 [IU] | Freq: Three times a day (TID) | SUBCUTANEOUS | Status: DC

## 2014-09-05 MED ORDER — PANTOPRAZOLE SODIUM 40 MG PO TBEC
40.0000 mg | DELAYED_RELEASE_TABLET | Freq: Every day | ORAL | Status: DC
  Administered 2014-09-05 – 2014-09-08 (×4): 40 mg via ORAL
  Filled 2014-09-05 (×3): qty 1

## 2014-09-05 MED ORDER — ONDANSETRON HCL 4 MG/2ML IJ SOLN
4.0000 mg | Freq: Four times a day (QID) | INTRAMUSCULAR | Status: DC | PRN
  Administered 2014-09-05 – 2014-09-08 (×6): 4 mg via INTRAVENOUS
  Filled 2014-09-05 (×7): qty 2

## 2014-09-05 MED ORDER — ACETAMINOPHEN 325 MG PO TABS
650.0000 mg | ORAL_TABLET | Freq: Four times a day (QID) | ORAL | Status: DC | PRN
  Administered 2014-09-05 – 2014-09-08 (×2): 650 mg via ORAL
  Filled 2014-09-05 (×2): qty 2

## 2014-09-05 MED ORDER — INSULIN ASPART PROT & ASPART (70-30 MIX) 100 UNIT/ML ~~LOC~~ SUSP
40.0000 [IU] | Freq: Two times a day (BID) | SUBCUTANEOUS | Status: DC
  Administered 2014-09-05 – 2014-09-06 (×2): 40 [IU] via SUBCUTANEOUS
  Filled 2014-09-05: qty 10

## 2014-09-05 MED ORDER — OXYCODONE HCL 5 MG PO TABS
5.0000 mg | ORAL_TABLET | ORAL | Status: DC | PRN
  Administered 2014-09-05 – 2014-09-07 (×3): 5 mg via ORAL
  Filled 2014-09-05 (×4): qty 1

## 2014-09-05 NOTE — ED Notes (Signed)
Pt ambulated to bathroom. Steady gait. Denies dizziness or lightheadedness.

## 2014-09-05 NOTE — Progress Notes (Signed)
PROGRESS NOTE  Allison Durham [id number] DOB: Jul 23, 1966 DOA: 09/04/2014 PCP: Rico Sheehan  HPI/Recap of past 24 hours: Patient is a 48 year old female with past oral history of poorly controlled diabetes mellitus, stage III chronic kidney disease and hypertension who presented to the emergency room with complaints of cough, fevers and chills and an enlarging boil on her abdomen 3 days.  Lab work noted a creatinine of 2.51, baseline around 1.7, and CBGs in the [place]. Patient was also found to have abdominal wall cellulitis with abscess which was debrided in the emergency room. She was started on IV antibiotics and hospitals were called for further evaluation and admission.  Assessment/Plan: Principal Problem: Sepsis: Given fever, tachycardia and acute renal failure from dehydration, patient meets criteria. Blood cultures pending Active Problems:   Cellulitis and abscess  Status post incision and drainage: Continue dressing packing, IV antibiotics Diabetes mellitus poorly controlled with renal complications: Continue insulin 70/30+ sliding-scale, awaiting A1c    Essential hypertension, benign: Blood pressure stable    URI (upper respiratory infection): Mild, chest x-ray unrevealing. This is not pneumonia.    Acute renal failure in the setting of CKD stage 3 due to type 2 diabetes mellitus: Hydrate, recheck labs in the morning. Patient is followed by [facility]    Obesity (BMI 30-39.9): Patient meets criteria with BMI greater than 30  Code Status: Full code  Family Communication: No family present, left message  Disposition Plan: Home once patient afebrile, CBG stable and kidney function improved   Consultants:  None  Procedures:  Status post I&D in the emergency room  Antibiotics:  IV clindamycin 1 dose 12/25  IV Zosyn 12/26-present  IV vancomycin 12/26-present   Objective: BP 110/58 mmHg  Pulse 88  Temp(Src) 98.4 F (36.9 C) (Oral)  Resp 16   SpO2 96% No intake or output data in the 24 hours ending 09/05/14 1305 There were no vitals filed for this visit.  Exam:   General:  Alert and oriented 3, no acute distress  Cardiovascular: Regular rate and rhythm, S1-S2  Respiratory: Clear to auscultation bilaterally  Abdomen: Soft, obese, wound noted for approximately 2 cm incision packed with gauze, no active drainage. Some surrounding mild erythema  Musculoskeletal: No clubbing or cyanosis or edema   Data Reviewed: Basic Metabolic Panel:  Recent Labs Lab 09/04/14 1845 09/05/14 0507  NA 135 134*  K 4.3 4.2  CL 102 104  CO2 21 19  GLUCOSE 235* 295*  BUN 28* 29*  CREATININE 2.51* 2.43*  CALCIUM 8.9 8.4   Liver Function Tests:  Recent Labs Lab 09/04/14 1845  AST 24  ALT 24  ALKPHOS 86  BILITOT 0.6  PROT 7.0  ALBUMIN 3.4*   No results for input(s): LIPASE, AMYLASE in the last 168 hours. No results for input(s): AMMONIA in the last 168 hours. CBC:  Recent Labs Lab 09/04/14 1845 09/05/14 0507  WBC 8.0 7.4  NEUTROABS 6.3  --   HGB 9.7* 9.1*  HCT 28.9* 27.2*  MCV 80.3 81.0  PLT 211 168   Cardiac Enzymes:   No results for input(s): CKTOTAL, CKMB, CKMBINDEX, TROPONINI in the last 168 hours. BNP (last 3 results) No results for input(s): PROBNP in the last 8760 hours. CBG:  Recent Labs Lab 09/05/14 0635 09/05/14 1120  GLUCAP 279* 190*    No results found for this or any previous visit (from the past 240 hour(s)).   Studies: Dg Chest 2 View  09/04/2014   CLINICAL DATA:  Cough for  4 days  EXAM: CHEST  2 VIEW  COMPARISON:  None.  FINDINGS: The heart size and mediastinal contours are within normal limits. Both lungs are clear. The visualized skeletal structures are unremarkable.  IMPRESSION: No active cardiopulmonary disease.   Electronically Signed   By: Maryclare Bean M.D.   On: 09/04/2014 20:13    Scheduled Meds: . amLODipine  10 mg Oral Daily  . cholecalciferol  1,000 Units Oral BID  . ferrous  sulfate  325 mg Oral BID WC  . heparin  5,000 Units Subcutaneous 3 times per day  . HYDROcodone-acetaminophen  1 tablet Oral Once  . insulin aspart  0-20 Units Subcutaneous TID WC  . insulin aspart  0-5 Units Subcutaneous QHS  . insulin aspart protamine- aspart  40 Units Subcutaneous BID WC  . latanoprost  1 drop Both Eyes QHS  . multivitamin with minerals  1 tablet Oral Daily  . pantoprazole  40 mg Oral Daily  . piperacillin-tazobactam (ZOSYN)  IV  3.375 g Intravenous 3 times per day  . [START ON 09/06/2014] vancomycin  1,000 mg Intravenous Q24H    Continuous Infusions: . sodium chloride 100 mL/hr (09/05/14 0400)     Time spent: 25 minutes  [place] [facility] Pager [phone]. If 7PM-7AM, please contact night-coverage at [email or web address], password [facility] 09/05/2014, 1:05 PM  LOS: 1 day

## 2014-09-05 NOTE — Progress Notes (Signed)
Pt requesting to get cleaned set up in the bathroom for cleanup. No further requests at this time.

## 2014-09-05 NOTE — H&P (Signed)
[facility] Admission History and Physical       Quinlan Boom [id number] DOB: 1966-01-11 DOA: 09/04/2014  Referring physician: EDP  PCP: Rico Sheehan  Specialists:    Chief Complaint: Fever and Chills   HPI: Allison Durham is a 48 y.o. female with a history of IDDM, HTN and Previous Cerebral Aneurysm Repair who present to the ED with complaints of URI Sxs of cough along with fever and chills and a enlarging boil on her ABD which had been worsening over 3 days.   She was found to have a temperature of 100.5 and Blood Cultures were sent, and an I+D of the ABD Abscess was performed and she was initially placed on IV clindamycin and referred for admission.  She began to have rigors so her antibiotics were adjusted.          Review of Systems:  Constitutional: No Weight Loss, No Weight Gain, Night Sweats, +Fevers, +Chills, Dizziness, Fatigue, or Generalized Weakness HEENT: No Headaches, Difficulty Swallowing,Tooth/Dental Problems,Sore Throat,  No Sneezing, Rhinitis, Ear Ache, Nasal Congestion, or Post Nasal Drip,  Cardio-vascular:  No Chest pain, Orthopnea, PND, Edema in Lower Extremities, Anasarca, Dizziness, Palpitations  Resp: No Dyspnea, No DOE, +Non-Productive Cough, No Hemoptysis, No Wheezing.    GI: No Heartburn, Indigestion, Abdominal Pain, Nausea, Vomiting, Diarrhea, Hematemesis, Hematochezia, Melena, Change in Bowel Habits,  Loss of Appetite  GU: No Dysuria, Change in Color of Urine, No Urgency or Frequency, No Flank pain.  Musculoskeletal: No Joint Pain or Swelling, No Decreased Range of Motion, No Back Pain.  Neurologic: No Syncope, No Seizures, Muscle Weakness, Paresthesia, Vision Disturbance or Loss, No Diplopia, No Vertigo, No Difficulty Walking,  Skin: No Rash or Lesions. Psych: No Change in Mood or Affect, No Depression or Anxiety, No Memory loss, No Confusion, or Hallucinations   Past Medical History  Diagnosis Date  . Diabetes mellitus   . Cerebral  aneurysm   . GERD (gastroesophageal reflux disease)   . Recurrent upper respiratory infection (URI)     1 -2 months ago, z-pack  . Headache(784.0)     2 per week  . Hypertension     PT. SEES DR. Nancy Fetter AT [facility]      Past Surgical History  Procedure Laterality Date  . Cerebral aneurysm repair         Prior to Admission medications   Medication Sig Start Date End Date Taking? Authorizing Provider  amLODipine (NORVASC) 10 MG tablet Take 10 mg by mouth daily.     Yes Historical Provider, MD  aspirin 325 MG EC tablet Take 325 mg by mouth daily as needed for pain.    Yes Historical Provider, MD  cholecalciferol (VITAMIN D) 1000 UNITS tablet Take 1,000 Units by mouth 2 (two) times daily.    Yes Historical Provider, MD  ferrous sulfate 325 (65 FE) MG tablet Take 325 mg by mouth 2 (two) times daily.   Yes Historical Provider, MD  insulin aspart protamine-insulin aspart (NOVOLOG 70/30) (70-30) 100 UNIT/ML injection Inject 40 Units into the skin 2 (two) times daily with a meal.    Yes Historical Provider, MD  lisinopril-hydrochlorothiazide (PRINZIDE,ZESTORETIC) 20-12.5 MG per tablet Take 1 tablet by mouth 2 (two) times daily.    Yes Historical Provider, MD  Multiple Vitamin (MULTIVITAMIN WITH MINERALS) TABS Take 1 tablet by mouth daily.   Yes Historical Provider, MD  omeprazole (PRILOSEC) 20 MG capsule Take 20 mg by mouth daily.   Yes Historical Provider, MD  terbinafine (LAMISIL)  250 MG tablet Take 250 mg by mouth daily.   Yes Historical Provider, MD  Travoprost, BAK Free, (TRAVATAN) 0.004 % SOLN ophthalmic solution Place 1 drop into both eyes at bedtime.   Yes Historical Provider, MD      No Known Allergies   Social History:  reports that she has never smoked. She does not have any smokeless tobacco history on file. She reports that she drinks alcohol. She reports that she does not use illicit drugs.     No family history on file.      Physical  Exam:  GEN:   Pleasant Obese  48 y.o. African American female examined and in no acute distress; cooperative with exam Filed Vitals:   09/05/14 0145 09/05/14 0221 09/05/14 0224 09/05/14 0518  BP: 166/79 144/71  110/58  Pulse:  114  88  Temp:  103.2 F (39.6 C) 99.4 F (37.4 C) 98.4 F (36.9 C)  TempSrc:  Oral Axillary Oral  Resp:  20  16  SpO2: 98% 100%  96%   Blood pressure 110/58, pulse 88, temperature 98.4 F (36.9 C), temperature source Oral, resp. rate 16, SpO2 96 %. PSYCH: She is alert and oriented x4; does not appear anxious does not appear depressed; affect is normal HEENT: Normocephalic and Atraumatic, Mucous membranes pink; PERRLA; EOM intact; Fundi:  Benign;  No scleral icterus, Nares: Patent, Oropharynx: Clear, Fair Dentition,    Neck:  FROM, No Cervical Lymphadenopathy nor Thyromegaly or Carotid Bruit; No JVD; Breasts:: Not examined CHEST WALL: No tenderness CHEST: Normal respiration, clear to auscultation bilaterally HEART: Regular rate and rhythm; no murmurs rubs or gallops BACK: No kyphosis or scoliosis; No CVA tenderness ABDOMEN: Positive Bowel Sounds,    Midline ABD Incision: packing wick extruding,   Sanguinous exudate on ABD pad Dressing.   Obese, Soft mildly Tender; No Masses, No Organomegaly, No Pannus; No Intertriginous candida. Rectal Exam: Not done EXTREMITIES: No Cyanosis, Clubbing, or Edema; No Ulcerations. Genitalia: not examined PULSES: 2+ and symmetric SKIN: Normal hydration no rash or ulceration CNS:  Alert and Oriented x 4, No Focal Deficits Vascular: pulses palpable throughout    Labs on Admission:  Basic Metabolic Panel:  Recent Labs Lab 09/04/14 1845 09/05/14 0507  NA 135 134*  K 4.3 4.2  CL 102 104  CO2 21 19  GLUCOSE 235* 295*  BUN 28* 29*  CREATININE 2.51* 2.43*  CALCIUM 8.9 8.4   Liver Function Tests:  Recent Labs Lab 09/04/14 1845  AST 24  ALT 24  ALKPHOS 86  BILITOT 0.6  PROT 7.0  ALBUMIN 3.4*   No results for  input(s): LIPASE, AMYLASE in the last 168 hours. No results for input(s): AMMONIA in the last 168 hours. CBC:  Recent Labs Lab 09/04/14 1845 09/05/14 0507  WBC 8.0 7.4  NEUTROABS 6.3  --   HGB 9.7* 9.1*  HCT 28.9* 27.2*  MCV 80.3 81.0  PLT 211 168   Cardiac Enzymes: No results for input(s): CKTOTAL, CKMB, CKMBINDEX, TROPONINI in the last 168 hours.  BNP (last 3 results) No results for input(s): PROBNP in the last 8760 hours. CBG:  Recent Labs Lab 09/05/14 0635  GLUCAP 279*    Radiological Exams on Admission: Dg Chest 2 View  09/04/2014   CLINICAL DATA:  Cough for 4 days  EXAM: CHEST  2 VIEW  COMPARISON:  None.  FINDINGS: The heart size and mediastinal contours are within normal limits. Both lungs are clear. The visualized skeletal structures are unremarkable.  IMPRESSION:  No active cardiopulmonary disease.   Electronically Signed   By: Maryclare Bean M.D.   On: 09/04/2014 20:13     EKG: Independently reviewed.     Assessment/Plan:  48 y.o. female with  Principal Problem:   1.   Sepsis   IV Vancomycin and Zosyn   IVFs  Active Problems:   2.   Cellulitis and abscess/ Status post incision and drainage   Wound packed and Dressed   Wound Care evaluation for Continued Care      3.   URI (upper respiratory infection)   Mucinex BID     4.   AKI (acute kidney injury)   Hold Lisinopril/HCTZ Rx   Gentle IVFs     5.   Diabetes mellitus without complication   On Insulin 70/30 BID   SSI coverage PRN   Check HbA1c  In AM     6   Essential hypertension, benign   Hold Lisinopril Rx due to #4   Continue Amlodipine as BP tolerates   Monitor BPs       7.   DVT Prophylaxis   Lovenox    Code Status: FULL CODE Family Communication:   Son at Bedside Disposition Plan: Inpatient         Time spent:   [place] [facility] Pager [phone]   If [place] Please Contact the Day Rounding Team MD for [facility]  If 7PM-7AM,  Please Contact Night-Floor Coverage  [email or web address] Password [facility] 09/05/2014, 8:48 AM

## 2014-09-05 NOTE — Progress Notes (Signed)
ANTIBIOTIC CONSULT NOTE - INITIAL  Pharmacy Consult for Vancomycin/Zosyn  Indication: rule out sepsis, cellulitis  No Known Allergies  Patient Measurements: ~85 kg  Vital Signs: Temp: 100.5 F (38.1 C) (12/25 1754) Temp Source: Oral (12/25 1754) BP: 147/79 mmHg (12/26 0045) Pulse Rate: 94 (12/26 0030)  Labs:  Recent Labs  09/04/14 1845  WBC 8.0  HGB 9.7*  PLT 211  CREATININE 2.51*    Medical History: Past Medical History  Diagnosis Date  . Diabetes mellitus   . Cerebral aneurysm   . GERD (gastroesophageal reflux disease)   . Recurrent upper respiratory infection (URI)     1 -2 months ago, z-pack  . Headache(784.0)     2 per week  . Hypertension     PT. SEES DR. Nancy Fetter AT [facility]    Assessment: 48 y/o F here with fever, cough, cellulitis of abdomen, WBC WNL, Tmax 100.5, renal dysfunction with Scr 2.51, other labs as above.   Goal of Therapy:  Vancomycin trough level 15-20 mcg/ml  Plan:  -Vancomycin 1000 mg IV q24h -Zosyn 3.375G IV q8h to be infused over 4 hours -Trend WBC, temp, renal function  -Drug levels as indicated   Narda Bonds 09/05/2014,1:27 AM

## 2014-09-06 LAB — BASIC METABOLIC PANEL
ANION GAP: 7 (ref 5–15)
Anion gap: 6 (ref 5–15)
BUN: 27 mg/dL — ABNORMAL HIGH (ref 6–23)
BUN: 24 mg/dL — AB (ref 6–23)
CHLORIDE: 109 meq/L (ref 96–112)
CO2: 21 mmol/L (ref 19–32)
CO2: 23 mmol/L (ref 19–32)
Calcium: 8.2 mg/dL — ABNORMAL LOW (ref 8.4–10.5)
Calcium: 8.5 mg/dL (ref 8.4–10.5)
Chloride: 107 mEq/L (ref 96–112)
Creatinine, Ser: 2.53 mg/dL — ABNORMAL HIGH (ref 0.50–1.10)
Creatinine, Ser: 2.24 mg/dL — ABNORMAL HIGH (ref 0.50–1.10)
GFR calc Af Amer: 24 mL/min — ABNORMAL LOW (ref >90)
GFR calc non Af Amer: 21 mL/min — ABNORMAL LOW (ref >90)
GFR calc non Af Amer: 24 mL/min — ABNORMAL LOW (ref >90)
GFR, EST AFRICAN AMERICAN: 28 mL/min — AB (ref >90)
GLUCOSE: 169 mg/dL — AB (ref 70–99)
Glucose, Bld: 231 mg/dL — ABNORMAL HIGH (ref 70–99)
Potassium: 4.4 mmol/L (ref 3.5–5.1)
Potassium: 4 mmol/L (ref 3.5–5.1)
SODIUM: 135 mmol/L (ref 135–145)
Sodium: 138 mmol/L (ref 135–145)

## 2014-09-06 LAB — CBC
HCT: 24.4 % — ABNORMAL LOW (ref 36.0–46.0)
Hemoglobin: 8.2 g/dL — ABNORMAL LOW (ref 12.0–15.0)
MCH: 27.6 pg (ref 26.0–34.0)
MCHC: 33.6 g/dL (ref 30.0–36.0)
MCV: 82.2 fL (ref 78.0–100.0)
PLATELETS: 135 10*3/uL — AB (ref 150–400)
RBC: 2.97 MIL/uL — AB (ref 3.87–5.11)
RDW: 12.6 % (ref 11.5–15.5)
WBC: 4.9 10*3/uL (ref 4.0–10.5)

## 2014-09-06 LAB — GLUCOSE, CAPILLARY
GLUCOSE-CAPILLARY: 172 mg/dL — AB (ref 70–99)
Glucose-Capillary: 199 mg/dL — ABNORMAL HIGH (ref 70–99)
Glucose-Capillary: 150 mg/dL — ABNORMAL HIGH (ref 70–99)
Glucose-Capillary: 54 mg/dL — ABNORMAL LOW (ref 70–99)
Glucose-Capillary: 93 mg/dL (ref 70–99)

## 2014-09-06 LAB — HEMOGLOBIN A1C
HEMOGLOBIN A1C: 8.7 % — AB (ref <5.7)
MEAN PLASMA GLUCOSE: 203 mg/dL — AB (ref <117)

## 2014-09-06 MED ORDER — DIPHENHYDRAMINE HCL 25 MG PO CAPS
25.0000 mg | ORAL_CAPSULE | Freq: Once | ORAL | Status: AC
  Administered 2014-09-06: 25 mg via ORAL
  Filled 2014-09-06: qty 1

## 2014-09-06 NOTE — Progress Notes (Signed)
PROGRESS NOTE  Allison Durham DOB: [birth year] DOA: 09/04/2014 PCP: Rico Sheehan  HPI/Recap of past 24 hours: Patient is a 48 year old female with past oral history of poorly controlled diabetes mellitus, stage III chronic kidney disease and hypertension who presented to the emergency room with complaints of cough, fevers and chills and an enlarging boil on her abdomen 3 days.  Lab work noted a creatinine of 2.51, baseline around 1.7, and CBGs in the [place]. Patient was also found to have abdominal wall cellulitis with abscess which was debrided in the emergency room. She was started on IV antibiotics and hospitals were called for further evaluation and admission.  Today patient doing okay. Still lots of drainage from her abdominal wound despite dressing  Assessment/Plan: Principal Problem: Sepsis: Given fever, tachycardia and acute renal failure from dehydration, patient meets criteria. Blood cultures pending, no growth to date Active Problems:   Cellulitis and abscess  Status post incision and drainage: Continue dressing packing, IV antibiotics. We'll have wound care to see Diabetes mellitus poorly controlled with renal complications: Continue insulin 70/30+ sliding-scale, awaiting A1c    Essential hypertension, benign: Blood pressure stable    URI (upper respiratory infection): Mild, chest x-ray unrevealing. This is not pneumonia.    Acute renal failure in the setting of CKD stage 3 due to type 2 diabetes mellitus: Hydrate, recheck labs in the morning. Patient is followed by [facility]-we'll get baseline from office tomorrow. This may be more chronic    Obesity (BMI 30-39.9): Patient meets criteria with BMI greater than 30  Code Status: Full code  Family Communication: No family present, left message  Disposition Plan: Likely home tomorrow   Consultants:  None  Procedures:  Status post I&D in the emergency room  Antibiotics:  IV clindamycin  1 dose 12/25  IV Zosyn 12/26-present  IV vancomycin 12/26-present   Objective: BP 96/60 mmHg  Pulse 79  Temp(Src) 98.1 F (36.7 C) (Oral)  Resp 16  SpO2 93%  Intake/Output Summary (Last 24 hours) at 09/06/14 1428 Last data filed at 09/06/14 1300  Gross per 24 hour  Intake    480 ml  Output      0 ml  Net    480 ml   There were no vitals filed for this visit.  Exam:   General:  Alert and oriented 3, no acute distress  Cardiovascular: Regular rate and rhythm, S1-S2  Respiratory: Clear to auscultation bilaterally  Abdomen: Soft, obese, wound noted for approximately 2 cm incision packed with gauze, bloody drainage uncovering  Musculoskeletal: No clubbing or cyanosis or edema   Data Reviewed: Basic Metabolic Panel:  Recent Labs Lab 09/04/14 1845 09/05/14 0507 09/06/14 0537  NA 135 134* 135  K 4.3 4.2 4.4  CL 102 104 107  CO2 21 19 21   GLUCOSE 235* 295* 231*  BUN 28* 29* 27*  CREATININE 2.51* 2.43* 2.53*  CALCIUM 8.9 8.4 8.2*   Liver Function Tests:  Recent Labs Lab 09/04/14 1845  AST 24  ALT 24  ALKPHOS 86  BILITOT 0.6  PROT 7.0  ALBUMIN 3.4*   No results for input(s): LIPASE, AMYLASE in the last 168 hours. No results for input(s): AMMONIA in the last 168 hours. CBC:  Recent Labs Lab 09/04/14 1845 09/05/14 0507 09/06/14 0537  WBC 8.0 7.4 4.9  NEUTROABS 6.3  --   --   HGB 9.7* 9.1* 8.2*  HCT 28.9* 27.2* 24.4*  MCV 80.3 81.0 82.2  PLT 211 168 135*  Cardiac Enzymes:   No results for input(s): CKTOTAL, CKMB, CKMBINDEX, TROPONINI in the last 168 hours. BNP (last 3 results) No results for input(s): PROBNP in the last 8760 hours. CBG:  Recent Labs Lab 09/05/14 1624 09/05/14 1803 09/05/14 2116 09/06/14 0632 09/06/14 1104  GLUCAP 66* 95 114* 199* 150*    Recent Results (from the past 240 hour(s))  Blood culture (routine x 2)     Status: None (Preliminary result)   Collection Time: 09/04/14  6:33 PM  Result Value Ref Range  Status   Specimen Description BLOOD LEFT ARM  Final   Special Requests BOTTLES DRAWN AEROBIC AND ANAEROBIC 5CC  Final   Culture   Final           BLOOD CULTURE RECEIVED NO GROWTH TO DATE CULTURE WILL BE HELD FOR 5 DAYS BEFORE ISSUING A FINAL NEGATIVE REPORT Performed at [facility]    Report Status PENDING  Incomplete  Blood culture (routine x 2)     Status: None (Preliminary result)   Collection Time: 09/04/14  6:38 PM  Result Value Ref Range Status   Specimen Description BLOOD RIGHT ARM  Final   Special Requests BOTTLES DRAWN AEROBIC AND ANAEROBIC 5CC  Final   Culture   Final           BLOOD CULTURE RECEIVED NO GROWTH TO DATE CULTURE WILL BE HELD FOR 5 DAYS BEFORE ISSUING A FINAL NEGATIVE REPORT Performed at [facility]    Report Status PENDING  Incomplete     Studies: No results found.  Scheduled Meds: . amLODipine  10 mg Oral Daily  . cholecalciferol  1,000 Units Oral BID  . ferrous sulfate  325 mg Oral BID WC  . heparin  5,000 Units Subcutaneous 3 times per day  . HYDROcodone-acetaminophen  1 tablet Oral Once  . insulin aspart  0-20 Units Subcutaneous TID WC  . insulin aspart  0-5 Units Subcutaneous QHS  . insulin aspart protamine- aspart  40 Units Subcutaneous BID WC  . latanoprost  1 drop Both Eyes QHS  . multivitamin with minerals  1 tablet Oral Daily  . pantoprazole  40 mg Oral Daily  . piperacillin-tazobactam (ZOSYN)  IV  3.375 g Intravenous 3 times per day  . vancomycin  1,000 mg Intravenous Q24H    Continuous Infusions: . sodium chloride 125 mL/hr at 09/06/14 0825     Time spent: 25 minutes  [facility] Pager [phone]. If 7PM-7AM, please contact night-coverage at [email or web address], password [facility] 09/06/2014, 2:28 PM  LOS: 2 days

## 2014-09-06 NOTE — Consult Note (Addendum)
WOC wound consult note Reason for Consult: Consult requested for abd wound and topical treatment orders.  Wound type: I&D was performed in the ER; refer to previous progress notes in the EMR.  Iodomorm packing strip was ordered. Continue this plan of care Q day. Wound is full thickness to abd; .3X1X1.2cm Narrow opening makes it difficult to visualize wound; appears to be beefy red Mod amt red drainage on previous packing when removed; no odor. Pt tolerated with mod amt discomfort after pain meds given.  Bedside nurse can change dressing Q day. Discussed plan of care with patient and she denies further questions. Please re-consult if further assistance is needed.  Thank-you,  Julien Girt MSN, [place], [place], [place], [place]

## 2014-09-07 LAB — GLUCOSE, CAPILLARY
GLUCOSE-CAPILLARY: 167 mg/dL — AB (ref 70–99)
GLUCOSE-CAPILLARY: 156 mg/dL — AB (ref 70–99)
Glucose-Capillary: 111 mg/dL — ABNORMAL HIGH (ref 70–99)
Glucose-Capillary: 120 mg/dL — ABNORMAL HIGH (ref 70–99)

## 2014-09-07 LAB — BASIC METABOLIC PANEL
Anion gap: 7 (ref 5–15)
BUN: 15 mg/dL (ref 6–23)
CHLORIDE: 110 meq/L (ref 96–112)
CO2: 23 mmol/L (ref 19–32)
CREATININE: 2.07 mg/dL — AB (ref 0.50–1.10)
Calcium: 8.8 mg/dL (ref 8.4–10.5)
GFR calc non Af Amer: 27 mL/min — ABNORMAL LOW (ref >90)
GFR, EST AFRICAN AMERICAN: 31 mL/min — AB (ref >90)
Glucose, Bld: 135 mg/dL — ABNORMAL HIGH (ref 70–99)
Potassium: 3.8 mmol/L (ref 3.5–5.1)
SODIUM: 140 mmol/L (ref 135–145)

## 2014-09-07 MED ORDER — LIVING WELL WITH DIABETES BOOK
Freq: Once | Status: DC
  Filled 2014-09-07: qty 1

## 2014-09-07 MED ORDER — CLINDAMYCIN HCL 300 MG PO CAPS
300.0000 mg | ORAL_CAPSULE | Freq: Four times a day (QID) | ORAL | Status: DC
  Administered 2014-09-07 – 2014-09-08 (×3): 300 mg via ORAL
  Filled 2014-09-07 (×5): qty 1

## 2014-09-07 NOTE — Progress Notes (Signed)
Inpatient Diabetes Program Recommendations  AACE/ADA: New Consensus Statement on Inpatient Glycemic Control (2013)  Target Ranges:  Prepandial:   less than 140 mg/dL      Peak postprandial:   less than 180 mg/dL (1-2 hours)      Critically ill patients:  140 - 180 mg/dL   Reason for Visit: Hypoglycemia  Diabetes history: DM2 Outpatient Diabetes medications: 70/30 35 units in am, 45 units in pm Current orders for Inpatient glycemic control: Same as home meds + Novolog resistant tidwc and hs  Results for GARGI, GALLARZO (MRN [id number]) as of 09/07/2014 15:04  Ref. Range 09/05/2014 16:24 09/05/2014 18:03 09/05/2014 21:16 09/06/2014 06:32 09/06/2014 11:04 09/06/2014 16:25 09/06/2014 21:54 09/06/2014 22:21 09/07/2014 06:26 09/07/2014 12:02  Glucose-Capillary Latest Range: 70-99 mg/dL 66 (L) 95 114 (H) 199 (H) 150 (H) 172 (H) 54 (L) 93 111 (H) 120 (H)   Results for ULONDA, POLIN (MRN [id number]) as of 09/07/2014 15:04  Ref. Range 09/06/2014 16:15  Hgb A1c MFr Bld Latest Range: <5.7 % 8.7 (H)   Hypoglycemia x 2 days.  Recommendations: Decrease Novolog to sensitive tidwc. Decrease 70/30 to 20 units bid.  Will need to f/u with PCP for diabetes management.  Discussed importance of checking blood sugars and taking logbook to MD office for PCP to manage blood sugars.  Thank you. Lorenda Peck, RD, LDN, CDE Inpatient Diabetes Coordinator [phone]

## 2014-09-07 NOTE — Progress Notes (Addendum)
PROGRESS NOTE  Allison Durham [id number] DOB: Apr 12, 1966 DOA: 09/04/2014 PCP: Rico Sheehan  HPI/Recap of past 24 hours: Patient is a 48 year old female with past oral history of poorly controlled diabetes mellitus, stage III chronic kidney disease and hypertension who presented to the emergency room with complaints of cough, fevers and chills and an enlarging boil on her abdomen 3 days.  Lab work noted a creatinine of 2.51, baseline around 1.7, and CBGs in the [place]. Patient was also found to have abdominal wall cellulitis with abscess which was debrided in the emergency room. She was started on IV antibiotics and hospitals were called for further evaluation and admission.  Doing better. Less drainage. Still some significant amount of pain when changing wound packing  Assessment/Plan: Principal Problem: Sepsis: Given fever, tachycardia and acute renal failure from dehydration, patient meets criteria. Blood cultures pending, no growth to date Active Problems:   Cellulitis and abscess  Status post incision and drainage: Continue dressing packing, change in by his to by mouth. Appreciate wound care help.   Diabetes mellitus poorly controlled with renal complications: Continue insulin 70/30+ sliding-scale, A1c at 8.7    Essential hypertension, benign: Blood pressure stable    URI (upper respiratory infection): Mild, chest x-ray unrevealing. This is not pneumonia.    Acute renal failure in the setting of CKD stage 3 due to type 2 diabetes mellitus: Hydrate, lab shows some good improvement. Patient is followed by [facility]-we'll get baseline from office tomorrow. This may be more chronic    Obesity (BMI 30-39.9): Patient meets criteria with BMI greater than 30  Code Status: Full code  Family Communication: Husband the bedside  Disposition Plan: Home tomorrow with wound care dressings   Consultants:  None  Procedures:  Status post I&D in the emergency  room  Antibiotics:  IV clindamycin 1 dose 12/25  IV Zosyn 12/26-12/28  IV vancomycin 12/26-12/28  Po Clindamycin 12/28-present   Objective: BP 124/68 mmHg  Pulse 82  Temp(Src) 99.1 F (37.3 C) (Oral)  Resp 18  SpO2 98%  Intake/Output Summary (Last 24 hours) at 09/07/14 2116 Last data filed at 09/07/14 [id number]  Gross per 24 hour  Intake    660 ml  Output      0 ml  Net    660 ml   There were no vitals filed for this visit.  Exam:   General:  Alert and oriented 3, no acute distress  Cardiovascular: Regular rate and rhythm, S1-S2  Respiratory: Clear to auscultation bilaterally  Abdomen: Soft, obese, wound noted for approximately 2 cm incision packed with gauze, bloody drainage uncovering  Musculoskeletal: No clubbing or cyanosis or edema   Data Reviewed: Basic Metabolic Panel:  Recent Labs Lab 09/04/14 1845 09/05/14 0507 09/06/14 0537 09/06/14 1615 09/07/14 1055  NA 135 134* 135 138 140  K 4.3 4.2 4.4 4.0 3.8  CL 102 104 107 109 110  CO2 21 19 21 23 23   GLUCOSE 235* 295* 231* 169* 135*  BUN 28* 29* 27* 24* 15  CREATININE 2.51* 2.43* 2.53* 2.24* 2.07*  CALCIUM 8.9 8.4 8.2* 8.5 8.8   Liver Function Tests:  Recent Labs Lab 09/04/14 1845  AST 24  ALT 24  ALKPHOS 86  BILITOT 0.6  PROT 7.0  ALBUMIN 3.4*   No results for input(s): LIPASE, AMYLASE in the last 168 hours. No results for input(s): AMMONIA in the last 168 hours. CBC:  Recent Labs Lab 09/04/14 1845 09/05/14 0507 09/06/14 0537  WBC 8.0 7.4 4.9  NEUTROABS 6.3  --   --   HGB 9.7* 9.1* 8.2*  HCT 28.9* 27.2* 24.4*  MCV 80.3 81.0 82.2  PLT 211 168 135*   Cardiac Enzymes:   No results for input(s): CKTOTAL, CKMB, CKMBINDEX, TROPONINI in the last 168 hours. BNP (last 3 results) No results for input(s): PROBNP in the last 8760 hours. CBG:  Recent Labs Lab 09/06/14 2221 09/07/14 0626 09/07/14 1202 09/07/14 1654 09/07/14 2104  GLUCAP 93 111* 120* 167* 156*    Recent  Results (from the past 240 hour(s))  Blood culture (routine x 2)     Status: None (Preliminary result)   Collection Time: 09/04/14  6:33 PM  Result Value Ref Range Status   Specimen Description BLOOD LEFT ARM  Final   Special Requests BOTTLES DRAWN AEROBIC AND ANAEROBIC 5CC  Final   Culture   Final           BLOOD CULTURE RECEIVED NO GROWTH TO DATE CULTURE WILL BE HELD FOR 5 DAYS BEFORE ISSUING A FINAL NEGATIVE REPORT Performed at [facility]    Report Status PENDING  Incomplete  Blood culture (routine x 2)     Status: None (Preliminary result)   Collection Time: 09/04/14  6:38 PM  Result Value Ref Range Status   Specimen Description BLOOD RIGHT ARM  Final   Special Requests BOTTLES DRAWN AEROBIC AND ANAEROBIC 5CC  Final   Culture   Final           BLOOD CULTURE RECEIVED NO GROWTH TO DATE CULTURE WILL BE HELD FOR 5 DAYS BEFORE ISSUING A FINAL NEGATIVE REPORT Performed at [facility]    Report Status PENDING  Incomplete     Studies: No results found.  Scheduled Meds: . amLODipine  10 mg Oral Daily  . cholecalciferol  1,000 Units Oral BID  . ferrous sulfate  325 mg Oral BID WC  . heparin  5,000 Units Subcutaneous 3 times per day  . HYDROcodone-acetaminophen  1 tablet Oral Once  . insulin aspart  0-20 Units Subcutaneous TID WC  . insulin aspart  0-5 Units Subcutaneous QHS  . insulin aspart protamine- aspart  40 Units Subcutaneous BID WC  . latanoprost  1 drop Both Eyes QHS  . living well with diabetes book   Does not apply Once  . multivitamin with minerals  1 tablet Oral Daily  . pantoprazole  40 mg Oral Daily  . piperacillin-tazobactam (ZOSYN)  IV  3.375 g Intravenous 3 times per day  . vancomycin  1,000 mg Intravenous Q24H    Continuous Infusions: . sodium chloride 125 mL/hr at 09/07/14 1600     Time spent: 15 minutes  [place] Pager [phone]. If 7PM-7AM, please contact night-coverage at [email or web address], password  [facility] 09/07/2014, 9:16 PM  LOS: 3 days

## 2014-09-07 NOTE — Progress Notes (Signed)
Utilization review completed. Sander Remedios, RN, BSN. 

## 2014-09-08 LAB — CBC
HCT: 24.3 % — ABNORMAL LOW (ref 36.0–46.0)
Hemoglobin: 8.1 g/dL — ABNORMAL LOW (ref 12.0–15.0)
MCH: 26.6 pg (ref 26.0–34.0)
MCHC: 33.3 g/dL (ref 30.0–36.0)
MCV: 79.9 fL (ref 78.0–100.0)
Platelets: 126 10*3/uL — ABNORMAL LOW (ref 150–400)
RBC: 3.04 MIL/uL — ABNORMAL LOW (ref 3.87–5.11)
RDW: 12.6 % (ref 11.5–15.5)
WBC: 3.3 10*3/uL — ABNORMAL LOW (ref 4.0–10.5)

## 2014-09-08 LAB — GLUCOSE, CAPILLARY
GLUCOSE-CAPILLARY: 102 mg/dL — AB (ref 70–99)
Glucose-Capillary: 144 mg/dL — ABNORMAL HIGH (ref 70–99)
Glucose-Capillary: 163 mg/dL — ABNORMAL HIGH (ref 70–99)

## 2014-09-08 LAB — BASIC METABOLIC PANEL
ANION GAP: 8 (ref 5–15)
BUN: 12 mg/dL (ref 6–23)
CALCIUM: 8.7 mg/dL (ref 8.4–10.5)
CO2: 24 mmol/L (ref 19–32)
CREATININE: 1.95 mg/dL — AB (ref 0.50–1.10)
Chloride: 107 mEq/L (ref 96–112)
GFR calc Af Amer: 33 mL/min — ABNORMAL LOW (ref >90)
GFR calc non Af Amer: 29 mL/min — ABNORMAL LOW (ref >90)
Glucose, Bld: 157 mg/dL — ABNORMAL HIGH (ref 70–99)
Potassium: 4.1 mmol/L (ref 3.5–5.1)
SODIUM: 139 mmol/L (ref 135–145)

## 2014-09-08 MED ORDER — METOPROLOL SUCCINATE ER 25 MG PO TB24: 12.5000 mg | ORAL_TABLET | Freq: Every day | ORAL | Status: DC

## 2014-09-08 MED ORDER — OXYCODONE HCL 5 MG PO TABS: 5.0000 mg | ORAL_TABLET | ORAL | Status: DC | PRN

## 2014-09-08 MED ORDER — LEVOFLOXACIN 250 MG PO TABS: 250.0000 mg | ORAL_TABLET | Freq: Every day | ORAL | Status: DC

## 2014-09-08 NOTE — Progress Notes (Signed)
Patient was discharged home, via wheelchair, with her son. The AVS and prescription was in hand.

## 2014-09-08 NOTE — Discharge Summary (Addendum)
Discharge Summary  Allison Durham [id number] DOB: 10/10/1965  PCP: Rico Sheehan  Admit date: 09/04/2014 Discharge date: 09/08/2014  Time spent: 25 minutes  Recommendations for Outpatient Follow-up:  1. Medication change: Given patient's worsening renal function, advised for her to indefinitely discontinue Lamisil and discontinue her lisinopril which may be able to be restarted as per her PCP 2. Patient advised to follow-up with her nephrologist in the next 1-2 months 3. New medication: Levaquin 500 mg by mouth daily 4 days 4. Dressing change: Patient advised to Pack abd wound with Iodoform packing strip Q day using swab to fill.  Cover with 4X4 and tape. 5. New medication: OxyIR 5 mg every 4 hours as needed for pain 6. New medication: Metoprolol 12.5 by mouth twice a day 7. We'll defer to patient's PCP about need to increase patient's insulin 70/30 which she does not have good control over.  Discharge Diagnoses:  Active Hospital Problems   Diagnosis Date Noted  . Sepsis 09/05/2014  . Cellulitis and abscess 09/05/2014  . Diabetes mellitus without complication 38/[id number]/7342  . Status post incision and drainage 09/05/2014  . AKI (acute kidney injury) 09/05/2014  . Essential hypertension, benign 09/05/2014  . URI (upper respiratory infection) 09/05/2014  . CKD stage 3 due to type 2 diabetes mellitus 09/05/2014  . Obesity (BMI 30-39.9) 09/05/2014    Resolved Hospital Problems   Diagnosis Date Noted Date Resolved  No resolved problems to display.    Discharge Condition: Improved, being discharged home  Diet recommendation: Carb modified  History of present illness:  Patient is a 48 year old female with past oral history of poorly controlled diabetes mellitus, stage III chronic kidney disease and hypertension who presented to the emergency room with complaints of cough, fevers and chills and an enlarging boil on her abdomen 3 days. Lab work noted a creatinine of 2.51, baseline  around 1.7, and CBGs in the [place]. Patient was also found to have abdominal wall cellulitis with abscess which was debrided in the emergency room. She was started on IV antibiotics and hospitals were called for further evaluation and admission.  Hospital Course:  Principal Problem:   Sepsis: Given fever, tachycardia and acute renal failure from dehydration, patient meets criteria. Blood cultures pending, no growth to date  Active Problems:   Cellulitis and abscess status post I and D in emergency room: Wound care saw patient. Patient start on IV antibiotics. Patient given iodoform dressing packing and change daily. After several days, wound improved, patient's pain better and change over to by mouth antibiotics and she was discharged home with several more days of PR biotics for total 7 days plus continued wound care. She'll follow-up with her PCP    Diabetes mellitus poorly controlled with renal couple locations: Patient continued on her insulin 70/30+ her sliding scale. A1c noted to be at 8.7. Patient likely will benefit from increasing her 70/30 insulin, but will defer this to her PCP    Essential hypertension, benign: Once patient hydrated, blood pressure started trending up. Since we're discontinuing her lisinopril/HCTZ, beta blocker added    Acute renal failure in the setting of CKD stage 3 due to type 2 diabetes mellitus: Patient was aggressively hydrated and lisinopril/HCTZ held as well as Lamisil. By day of discharge, creatinine down to 1.9. Advised patient to discontinue Lamisil and lisinopril/HCTZ and she already follows with [facility], but denies not had an appointment with them for a well. Advised her that she needs to follow up with them in the next  few months.    Obesity (BMI 30-39.9): Patient met criteria with BMI greater than 30   Procedures:  None  Consultations:  None  Discharge Exam: BP 120/60 mmHg  Pulse 80  Temp(Src) 98.6 F (37 C) (Oral)  Resp 18   SpO2 98%  General: Alert and oriented 3, no acute distress Cardiovascular: Regular rate and rhythm, S1-S2 Respiratory: Clear to auscultation bilaterally  Discharge Instructions You were cared for by a hospitalist during your hospital stay. If you have any questions about your discharge medications or the care you received while you were in the hospital after you are discharged, you can call the unit and asked to speak with the hospitalist on call if the hospitalist that took care of you is not available. Once you are discharged, your primary care physician will handle any further medical issues. Please note that NO REFILLS for any discharge medications will be authorized once you are discharged, as it is imperative that you return to your primary care physician (or establish a relationship with a primary care physician if you do not have one) for your aftercare needs so that they can reassess your need for medications and monitor your lab values.  Discharge Instructions    Diet - low sodium heart healthy    Complete by:  As directed      Increase activity slowly    Complete by:  As directed             Medication List    STOP taking these medications        lisinopril-hydrochlorothiazide 20-12.5 MG per tablet  Commonly known as:  PRINZIDE,ZESTORETIC     terbinafine 250 MG tablet  Commonly known as:  LAMISIL      TAKE these medications        amLODipine 10 MG tablet  Commonly known as:  NORVASC  Take 10 mg by mouth daily.     aspirin 325 MG EC tablet  Take 325 mg by mouth daily as needed for pain.     cholecalciferol 1000 UNITS tablet  Commonly known as:  VITAMIN D  Take 1,000 Units by mouth 2 (two) times daily.     ferrous sulfate 325 (65 FE) MG tablet  Take 325 mg by mouth 2 (two) times daily.     insulin aspart protamine- aspart (70-30) 100 UNIT/ML injection  Commonly known as:  NOVOLOG MIX 70/30  Inject 40 Units into the skin 2 (two) times daily with a meal.      levofloxacin 250 MG tablet  Commonly known as:  LEVAQUIN  Take 1 tablet (250 mg total) by mouth daily.     metoprolol succinate 25 MG 24 hr tablet  Commonly known as:  TOPROL-XL  Take 0.5 tablets (12.5 mg total) by mouth daily.     multivitamin with minerals Tabs tablet  Take 1 tablet by mouth daily.     omeprazole 20 MG capsule  Commonly known as:  PRILOSEC  Take 20 mg by mouth daily.     oxyCODONE 5 MG immediate release tablet  Commonly known as:  Oxy IR/ROXICODONE  Take 1 tablet (5 mg total) by mouth every 4 (four) hours as needed for moderate pain.     Travoprost (BAK Free) 0.004 % Soln ophthalmic solution  Commonly known as:  TRAVATAN  Place 1 drop into both eyes at bedtime.       No Known Allergies    The results of significant diagnostics from this hospitalization (including imaging,  microbiology, ancillary and laboratory) are listed below for reference.    Significant Diagnostic Studies: Dg Chest 2 View  09/04/2014   CLINICAL DATA:  Cough for 4 days  EXAM: CHEST  2 VIEW  COMPARISON:  None.  FINDINGS: The heart size and mediastinal contours are within normal limits. Both lungs are clear. The visualized skeletal structures are unremarkable.  IMPRESSION: No active cardiopulmonary disease.   Electronically Signed   By: Maryclare Bean M.D.   On: 09/04/2014 20:13    Microbiology: Recent Results (from the past 240 hour(s))  Blood culture (routine x 2)     Status: None (Preliminary result)   Collection Time: 09/04/14  6:33 PM  Result Value Ref Range Status   Specimen Description BLOOD LEFT ARM  Final   Special Requests BOTTLES DRAWN AEROBIC AND ANAEROBIC 5CC  Final   Culture   Final           BLOOD CULTURE RECEIVED NO GROWTH TO DATE CULTURE WILL BE HELD FOR 5 DAYS BEFORE ISSUING A FINAL NEGATIVE REPORT Performed at [facility]    Report Status PENDING  Incomplete  Blood culture (routine x 2)     Status: None (Preliminary result)   Collection Time: 09/04/14  6:38  PM  Result Value Ref Range Status   Specimen Description BLOOD RIGHT ARM  Final   Special Requests BOTTLES DRAWN AEROBIC AND ANAEROBIC 5CC  Final   Culture   Final           BLOOD CULTURE RECEIVED NO GROWTH TO DATE CULTURE WILL BE HELD FOR 5 DAYS BEFORE ISSUING A FINAL NEGATIVE REPORT Performed at [facility]    Report Status PENDING  Incomplete     Labs: Basic Metabolic Panel:  Recent Labs Lab 09/05/14 0507 09/06/14 0537 09/06/14 1615 09/07/14 1055 09/08/14 0536  NA 134* 135 138 140 139  K 4.2 4.4 4.0 3.8 4.1  CL 104 107 109 110 107  CO2 _0 GLUCOSE 295* 231* 169* 135* 157*  BUN 29* 27* 24* 15 12  CREATININE 2.43* 2.53* 2.24* 2.07* 1.95*  CALCIUM 8.4 8.2* 8.5 8.8 8.7   Liver Function Tests:  Recent Labs Lab 09/04/14 1845  AST 24  ALT 24  ALKPHOS 86  BILITOT 0.6  PROT 7.0  ALBUMIN 3.4*   No results for input(s): LIPASE, AMYLASE in the last 168 hours. No results for input(s): AMMONIA in the last 168 hours. CBC:  Recent Labs Lab 09/04/14 1845 09/05/14 0507 09/06/14 0537 09/08/14 0536  WBC 8.0 7.4 4.9 3.3*  NEUTROABS 6.3  --   --   --   HGB 9.7* 9.1* 8.2* 8.1*  HCT 28.9* 27.2* 24.4* 24.3*  MCV 80.3 81.0 82.2 79.9  PLT 211 168 135* 126*   Cardiac Enzymes: No results for input(s): CKTOTAL, CKMB, CKMBINDEX, TROPONINI in the last 168 hours. BNP: BNP (last 3 results) No results for input(s): PROBNP in the last 8760 hours. CBG:  Recent Labs Lab 09/07/14 1654 09/07/14 2104 09/08/14 0638 09/08/14 1120 09/08/14 1557  GLUCAP 167* 156* 144* 102* 163*       Signed:  KRISHNAN,SENDIL K  [facility] 09/08/2014, 7:08 PM

## 2014-09-13 LAB — CULTURE, BLOOD (ROUTINE X 2)
CULTURE: NO GROWTH
CULTURE: NO GROWTH

## 2014-09-24 ENCOUNTER — Encounter (HOSPITAL_COMMUNITY): Payer: Self-pay | Admitting: Interventional Radiology

## 2014-10-23 ENCOUNTER — Other Ambulatory Visit: Payer: Self-pay | Admitting: Nephrology

## 2014-10-23 DIAGNOSIS — N179 Acute kidney failure, unspecified: Secondary | ICD-10-CM

## 2014-10-30 ENCOUNTER — Ambulatory Visit
Admission: RE | Admit: 2014-10-30 | Discharge: 2014-10-30 | Disposition: A | Discharge status: Home / Self Care | Payer: BLUE CROSS/BLUE SHIELD | Source: Ambulatory Visit | Attending: Nephrology | Admitting: Nephrology

## 2014-10-30 DIAGNOSIS — N179 Acute kidney failure, unspecified: Secondary | ICD-10-CM

## 2014-10-30 IMAGING — US US RENAL
1 series · 14 of 25 positions shown · non-contrast
Comparison: None.

ADDENDUM:
The addendum is being created to record a pre-void volume of the
urinary bladder and a post-void residual. This was not part of the
given history.

Prevoid volume 361 cc
Postvoid volume 0 cc.
CLINICAL DATA: 48-year-old female with a history of acute kidney
injury
EXAM:
RENAL/URINARY TRACT ULTRASOUND COMPLETE

[Series 1: us renal · 0.23mm/px · 14 of 28 slices shown]
[im 1/28]
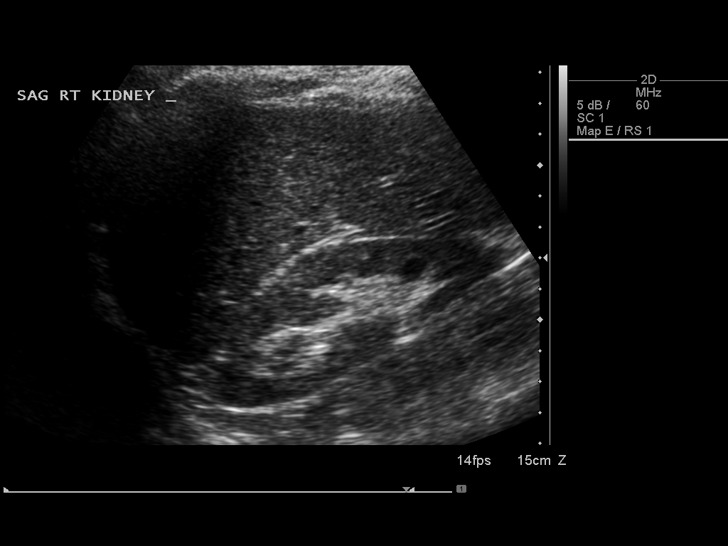
[im 3/28]
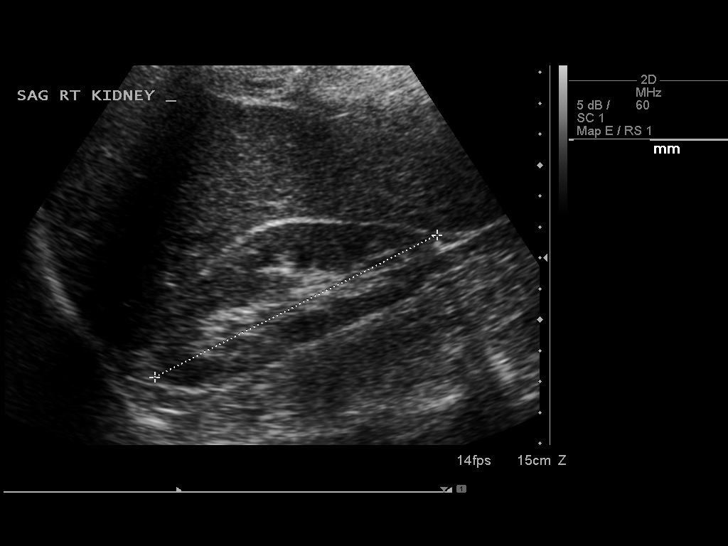
[im 5/28]
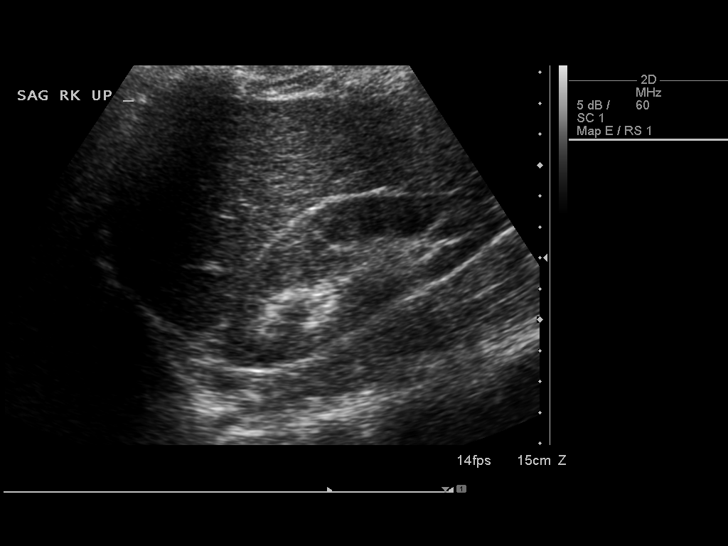
[im 7/28]
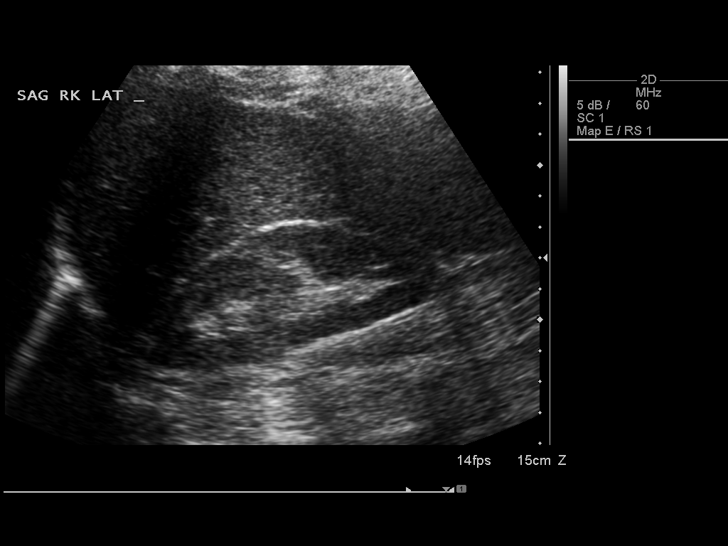
[im 10/28]
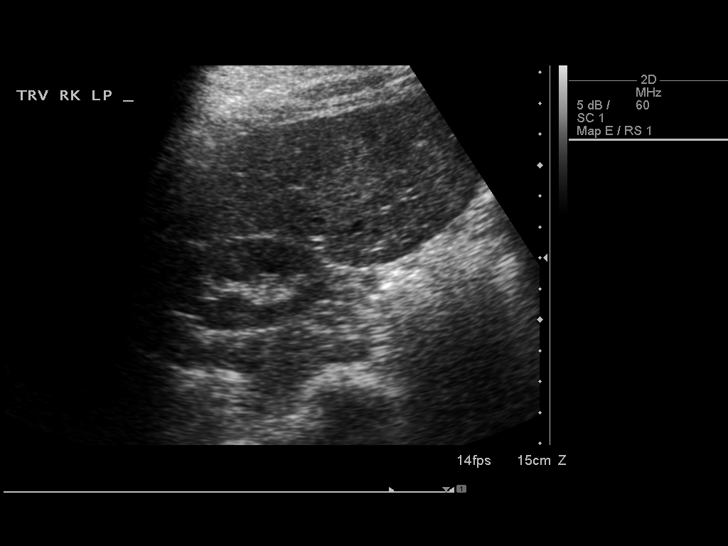
[im 11/28]
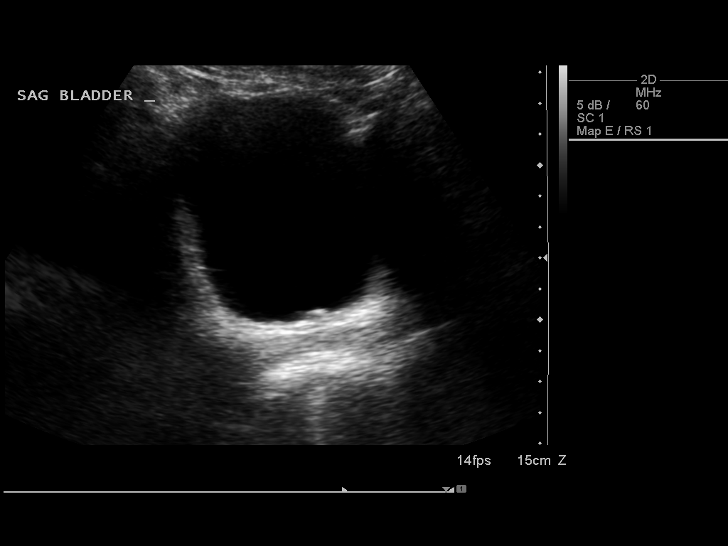
[im 13/28]
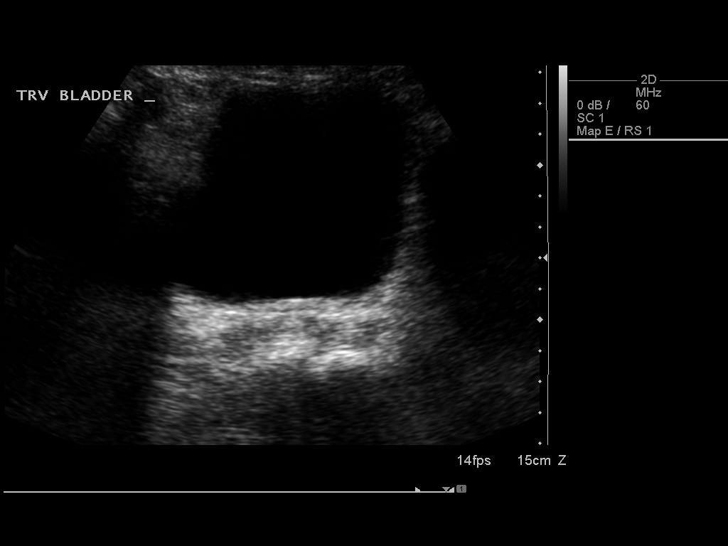
[im 15/28]
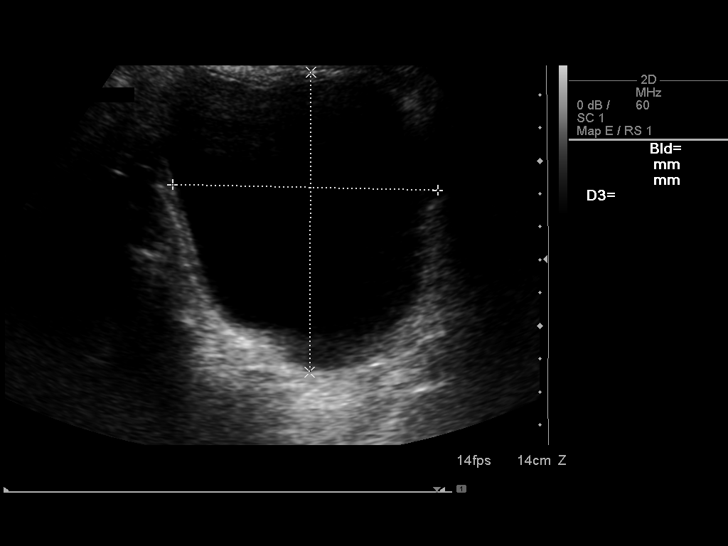
[im 17/28]
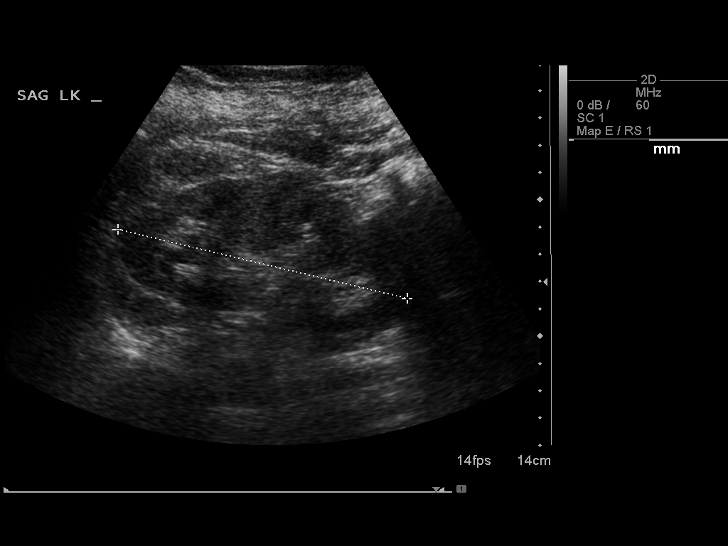
[im 19/28]
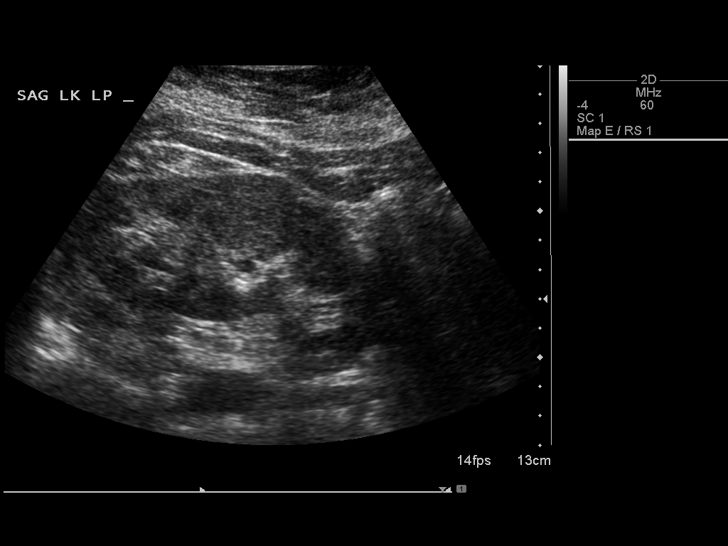
[im 21/28]
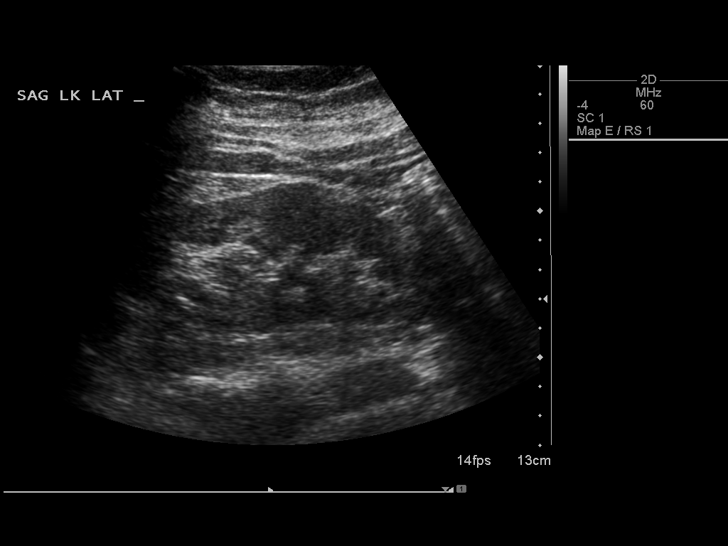
[im 23/28]
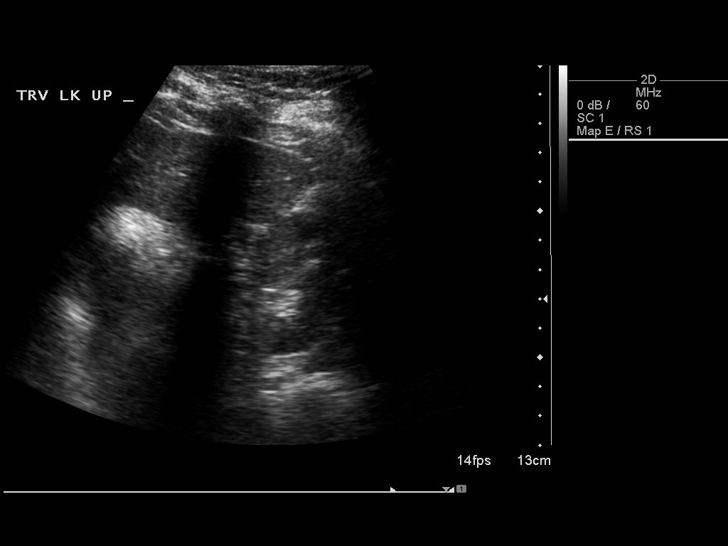
[im 25/28]
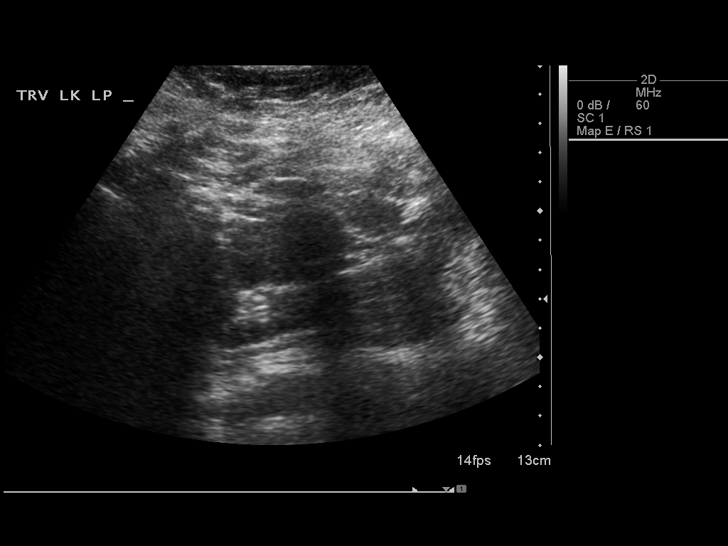
[im 28/28]
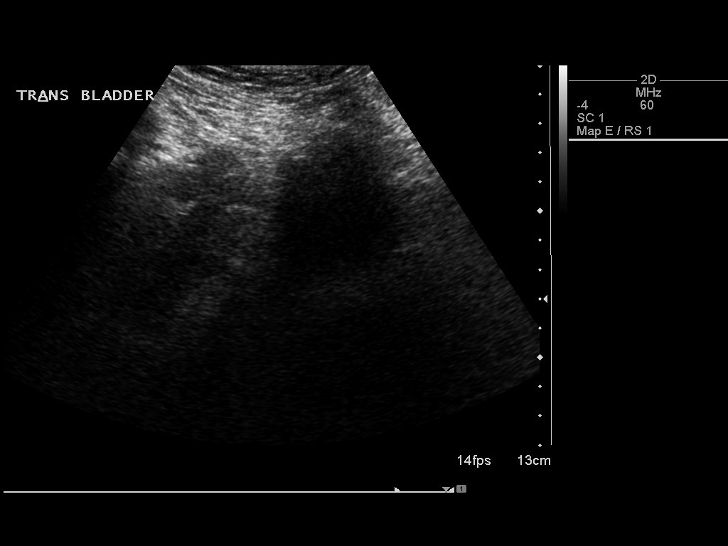

[14 of 25 positions shown; findings below may reference images not displayed]

FINDINGS: Right Kidney:

Length: 10.2 cm. Echogenicity is similar to that of the adjacent
liver. No contour abnormality. No hydronephrosis. No internal
reflectors.

Left Kidney:

Length: 11.1 cm. Echogenicity similar to that of the adjacent
spleen. No contour abnormality. No hydronephrosis. No internal
reflectors.

Bladder:

Appears normal for degree of bladder distention.
IMPRESSION: Unremarkable sonographic survey of the kidneys.

## 2015-12-15 DIAGNOSIS — D649 Anemia, unspecified: Secondary | ICD-10-CM | POA: Diagnosis not present

## 2015-12-15 DIAGNOSIS — E1165 Type 2 diabetes mellitus with hyperglycemia: Secondary | ICD-10-CM | POA: Diagnosis not present

## 2015-12-15 DIAGNOSIS — E0590 Thyrotoxicosis, unspecified without thyrotoxic crisis or storm: Secondary | ICD-10-CM | POA: Diagnosis not present

## 2016-01-20 DIAGNOSIS — I10 Essential (primary) hypertension: Secondary | ICD-10-CM | POA: Diagnosis not present

## 2016-02-03 DIAGNOSIS — I10 Essential (primary) hypertension: Secondary | ICD-10-CM | POA: Diagnosis not present

## 2016-05-30 DIAGNOSIS — J0100 Acute maxillary sinusitis, unspecified: Secondary | ICD-10-CM | POA: Diagnosis not present

## 2016-07-26 DIAGNOSIS — I10 Essential (primary) hypertension: Secondary | ICD-10-CM | POA: Diagnosis not present

## 2016-07-26 DIAGNOSIS — E118 Type 2 diabetes mellitus with unspecified complications: Secondary | ICD-10-CM | POA: Diagnosis not present

## 2016-07-26 DIAGNOSIS — N183 Chronic kidney disease, stage 3 (moderate): Secondary | ICD-10-CM | POA: Diagnosis not present

## 2016-07-26 DIAGNOSIS — E1121 Type 2 diabetes mellitus with diabetic nephropathy: Secondary | ICD-10-CM | POA: Diagnosis not present

## 2016-08-07 DIAGNOSIS — I10 Essential (primary) hypertension: Secondary | ICD-10-CM | POA: Diagnosis not present

## 2016-11-21 DIAGNOSIS — E1121 Type 2 diabetes mellitus with diabetic nephropathy: Secondary | ICD-10-CM | POA: Diagnosis not present

## 2016-11-21 DIAGNOSIS — I10 Essential (primary) hypertension: Secondary | ICD-10-CM | POA: Diagnosis not present

## 2016-11-21 DIAGNOSIS — E118 Type 2 diabetes mellitus with unspecified complications: Secondary | ICD-10-CM | POA: Diagnosis not present

## 2016-11-21 DIAGNOSIS — N183 Chronic kidney disease, stage 3 (moderate): Secondary | ICD-10-CM | POA: Diagnosis not present

## 2016-11-22 DIAGNOSIS — J040 Acute laryngitis: Secondary | ICD-10-CM | POA: Diagnosis not present

## 2017-01-22 DIAGNOSIS — Z01419 Encounter for gynecological examination (general) (routine) without abnormal findings: Secondary | ICD-10-CM | POA: Diagnosis not present

## 2017-01-22 DIAGNOSIS — Z6834 Body mass index (BMI) 34.0-34.9, adult: Secondary | ICD-10-CM | POA: Diagnosis not present

## 2017-01-22 DIAGNOSIS — Z1231 Encounter for screening mammogram for malignant neoplasm of breast: Secondary | ICD-10-CM | POA: Diagnosis not present

## 2017-01-22 DIAGNOSIS — D649 Anemia, unspecified: Secondary | ICD-10-CM | POA: Diagnosis not present

## 2017-01-24 DIAGNOSIS — D509 Iron deficiency anemia, unspecified: Secondary | ICD-10-CM | POA: Diagnosis not present

## 2017-02-06 DIAGNOSIS — D649 Anemia, unspecified: Secondary | ICD-10-CM | POA: Diagnosis not present

## 2017-02-06 DIAGNOSIS — N184 Chronic kidney disease, stage 4 (severe): Secondary | ICD-10-CM | POA: Diagnosis not present

## 2017-02-06 DIAGNOSIS — I10 Essential (primary) hypertension: Secondary | ICD-10-CM | POA: Diagnosis not present

## 2017-03-29 DIAGNOSIS — E118 Type 2 diabetes mellitus with unspecified complications: Secondary | ICD-10-CM | POA: Diagnosis not present

## 2017-03-29 DIAGNOSIS — E1121 Type 2 diabetes mellitus with diabetic nephropathy: Secondary | ICD-10-CM | POA: Diagnosis not present

## 2017-03-29 DIAGNOSIS — N183 Chronic kidney disease, stage 3 (moderate): Secondary | ICD-10-CM | POA: Diagnosis not present

## 2017-03-29 DIAGNOSIS — I10 Essential (primary) hypertension: Secondary | ICD-10-CM | POA: Diagnosis not present

## 2017-04-18 DIAGNOSIS — H401132 Primary open-angle glaucoma, bilateral, moderate stage: Secondary | ICD-10-CM | POA: Diagnosis not present

## 2017-04-18 DIAGNOSIS — E119 Type 2 diabetes mellitus without complications: Secondary | ICD-10-CM | POA: Diagnosis not present

## 2017-04-24 ENCOUNTER — Encounter (HOSPITAL_COMMUNITY): Payer: Self-pay | Admitting: Emergency Medicine

## 2017-04-24 ENCOUNTER — Ambulatory Visit (HOSPITAL_COMMUNITY)
Admission: EM | Admit: 2017-04-24 | Discharge: 2017-04-24 | Disposition: A | Discharge status: Home / Self Care | Payer: BLUE CROSS/BLUE SHIELD | Attending: Family Medicine | Admitting: Family Medicine

## 2017-04-24 DIAGNOSIS — M79644 Pain in right finger(s): Secondary | ICD-10-CM | POA: Diagnosis not present

## 2017-04-24 DIAGNOSIS — M654 Radial styloid tenosynovitis [de Quervain]: Secondary | ICD-10-CM

## 2017-04-24 MED ORDER — NAPROXEN 500 MG PO TABS: 500.0000 mg | ORAL_TABLET | Freq: Two times a day (BID) | ORAL | 0 refills | Status: DC

## 2017-04-24 NOTE — ED Provider Notes (Signed)
  [place]   [id number] 04/24/17 Arrival Time: 9563  ASSESSMENT & PLAN:  1. Thumb pain, right   2. De Quervain's disease (radial styloid tenosynovitis)     Meds ordered this encounter  Medications  . naproxen (NAPROSYN) 500 MG tablet    Sig: Take 1 tablet (500 mg total) by mouth 2 (two) times daily with a meal.    Dispense:  20 tablet    Refill:  0    Order Specific Question:   Supervising Provider    Answer:   Sherlene Shams [[id number]]    Reviewed expectations re: course of current medical issues. Questions answered. Outlined signs and symptoms indicating need for more acute intervention. Patient verbalized understanding. After Visit Summary given.   SUBJECTIVE:  Allison Durham is a 51 y.o. female who presents with complaint of right thumb pain.  She states this is causing more discomfort and her right thumb is painful and she her right thumb gets stuck.  ROS: As per HPI.   OBJECTIVE:  Vitals:   04/24/17 1619  BP: (!) 183/92  Pulse: 77  Temp: 98.5 F (36.9 C)  TempSrc: Oral  SpO2: 100%     General appearance: alert; no distress HEENT: normocephalic; atraumatic; conjunctivae normal; TMs normal; nasal mucosa normal; oral mucosa normal Neck: supple Lungs: clear to auscultation bilaterally Heart: regular rate and rhythm MS - TTP right thumb with positive Finkl[place]in Neurologic: normal symmetric reflexes; normal gait Psychological:  alert and cooperative; normal mood and affect    Labs Reviewed - No data to display  No results found.  No Known Allergies  PMHx, SurgHx, SocialHx, Medications, and Allergies were reviewed in the Visit Navigator and updated as appropriate.      Lysbeth Penner, [place] 04/24/17 [phone]

## 2017-04-24 NOTE — ED Triage Notes (Signed)
Pt reports right thumb pain related to no injury.  She states this is a recurrent issue.

## 2017-05-02 DIAGNOSIS — H401132 Primary open-angle glaucoma, bilateral, moderate stage: Secondary | ICD-10-CM | POA: Diagnosis not present

## 2017-07-03 DIAGNOSIS — E0590 Thyrotoxicosis, unspecified without thyrotoxic crisis or storm: Secondary | ICD-10-CM | POA: Diagnosis not present

## 2017-07-03 DIAGNOSIS — I10 Essential (primary) hypertension: Secondary | ICD-10-CM | POA: Diagnosis not present

## 2017-07-03 DIAGNOSIS — N189 Chronic kidney disease, unspecified: Secondary | ICD-10-CM | POA: Diagnosis not present

## 2017-07-03 DIAGNOSIS — R809 Proteinuria, unspecified: Secondary | ICD-10-CM | POA: Diagnosis not present

## 2017-07-03 DIAGNOSIS — E1165 Type 2 diabetes mellitus with hyperglycemia: Secondary | ICD-10-CM | POA: Diagnosis not present

## 2017-07-03 DIAGNOSIS — Z23 Encounter for immunization: Secondary | ICD-10-CM | POA: Diagnosis not present

## 2017-07-16 DIAGNOSIS — J069 Acute upper respiratory infection, unspecified: Secondary | ICD-10-CM | POA: Diagnosis not present

## 2017-07-30 DIAGNOSIS — N184 Chronic kidney disease, stage 4 (severe): Secondary | ICD-10-CM | POA: Diagnosis not present

## 2017-07-30 DIAGNOSIS — E1121 Type 2 diabetes mellitus with diabetic nephropathy: Secondary | ICD-10-CM | POA: Diagnosis not present

## 2017-07-30 DIAGNOSIS — I129 Hypertensive chronic kidney disease with stage 1 through stage 4 chronic kidney disease, or unspecified chronic kidney disease: Secondary | ICD-10-CM | POA: Diagnosis not present

## 2017-07-30 DIAGNOSIS — E118 Type 2 diabetes mellitus with unspecified complications: Secondary | ICD-10-CM | POA: Diagnosis not present

## 2017-08-07 DIAGNOSIS — I10 Essential (primary) hypertension: Secondary | ICD-10-CM | POA: Diagnosis not present

## 2017-10-30 DIAGNOSIS — J329 Chronic sinusitis, unspecified: Secondary | ICD-10-CM | POA: Diagnosis not present

## 2017-11-16 DIAGNOSIS — J329 Chronic sinusitis, unspecified: Secondary | ICD-10-CM | POA: Diagnosis not present

## 2017-11-19 DIAGNOSIS — I10 Essential (primary) hypertension: Secondary | ICD-10-CM | POA: Diagnosis not present

## 2017-11-19 DIAGNOSIS — R809 Proteinuria, unspecified: Secondary | ICD-10-CM | POA: Diagnosis not present

## 2017-11-19 DIAGNOSIS — E1165 Type 2 diabetes mellitus with hyperglycemia: Secondary | ICD-10-CM | POA: Diagnosis not present

## 2017-11-19 DIAGNOSIS — N189 Chronic kidney disease, unspecified: Secondary | ICD-10-CM | POA: Diagnosis not present

## 2017-12-05 DIAGNOSIS — E1121 Type 2 diabetes mellitus with diabetic nephropathy: Secondary | ICD-10-CM | POA: Diagnosis not present

## 2017-12-05 DIAGNOSIS — I129 Hypertensive chronic kidney disease with stage 1 through stage 4 chronic kidney disease, or unspecified chronic kidney disease: Secondary | ICD-10-CM | POA: Diagnosis not present

## 2017-12-05 DIAGNOSIS — E118 Type 2 diabetes mellitus with unspecified complications: Secondary | ICD-10-CM | POA: Diagnosis not present

## 2017-12-05 DIAGNOSIS — N184 Chronic kidney disease, stage 4 (severe): Secondary | ICD-10-CM | POA: Diagnosis not present

## 2018-01-24 DIAGNOSIS — Z6834 Body mass index (BMI) 34.0-34.9, adult: Secondary | ICD-10-CM | POA: Diagnosis not present

## 2018-01-24 DIAGNOSIS — Z01419 Encounter for gynecological examination (general) (routine) without abnormal findings: Secondary | ICD-10-CM | POA: Diagnosis not present

## 2018-01-24 DIAGNOSIS — Z1231 Encounter for screening mammogram for malignant neoplasm of breast: Secondary | ICD-10-CM | POA: Diagnosis not present

## 2018-02-06 DIAGNOSIS — I10 Essential (primary) hypertension: Secondary | ICD-10-CM | POA: Diagnosis not present

## 2018-03-20 DIAGNOSIS — N184 Chronic kidney disease, stage 4 (severe): Secondary | ICD-10-CM | POA: Diagnosis not present

## 2018-03-20 DIAGNOSIS — I10 Essential (primary) hypertension: Secondary | ICD-10-CM | POA: Diagnosis not present

## 2018-03-20 DIAGNOSIS — E118 Type 2 diabetes mellitus with unspecified complications: Secondary | ICD-10-CM | POA: Diagnosis not present

## 2018-03-20 DIAGNOSIS — D631 Anemia in chronic kidney disease: Secondary | ICD-10-CM | POA: Diagnosis not present

## 2018-04-12 ENCOUNTER — Other Ambulatory Visit (HOSPITAL_COMMUNITY): Payer: Self-pay | Admitting: *Deleted

## 2018-04-15 ENCOUNTER — Encounter (HOSPITAL_COMMUNITY)
Admission: RE | Admit: 2018-04-15 | Discharge: 2018-04-15 | Disposition: A | Discharge status: Home / Self Care | Payer: BLUE CROSS/BLUE SHIELD | Source: Ambulatory Visit | Attending: Nephrology | Admitting: Nephrology

## 2018-04-15 VITALS — BP 159/79 | HR 68 | Temp 98.3°F | Ht 62.0 in | Wt 182.0 lb

## 2018-04-15 DIAGNOSIS — D631 Anemia in chronic kidney disease: Secondary | ICD-10-CM | POA: Insufficient documentation

## 2018-04-15 DIAGNOSIS — N183 Chronic kidney disease, stage 3 unspecified: Secondary | ICD-10-CM

## 2018-04-15 DIAGNOSIS — E1122 Type 2 diabetes mellitus with diabetic chronic kidney disease: Secondary | ICD-10-CM

## 2018-04-15 DIAGNOSIS — Z79899 Other long term (current) drug therapy: Secondary | ICD-10-CM | POA: Diagnosis not present

## 2018-04-15 DIAGNOSIS — Z5181 Encounter for therapeutic drug level monitoring: Secondary | ICD-10-CM | POA: Insufficient documentation

## 2018-04-15 DIAGNOSIS — N184 Chronic kidney disease, stage 4 (severe): Secondary | ICD-10-CM | POA: Insufficient documentation

## 2018-04-15 LAB — POCT HEMOGLOBIN-HEMACUE: Hemoglobin: 10.6 g/dL — ABNORMAL LOW (ref 12.0–15.0)

## 2018-04-15 MED ORDER — EPOETIN ALFA 20000 UNIT/ML IJ SOLN
INTRAMUSCULAR | Status: AC
  Filled 2018-04-15: qty 1

## 2018-04-15 MED ORDER — SODIUM CHLORIDE 0.9 % IV SOLN
510.0000 mg | INTRAVENOUS | Status: DC
  Administered 2018-04-15: 510 mg via INTRAVENOUS
  Filled 2018-04-15: qty 17

## 2018-04-15 MED ORDER — EPOETIN ALFA 20000 UNIT/ML IJ SOLN
20000.0000 [IU] | INTRAMUSCULAR | Status: DC
  Administered 2018-04-15: 20000 [IU] via SUBCUTANEOUS

## 2018-04-15 NOTE — Discharge Instructions (Signed)
Epoetin Alfa injection °What is this medicine? °EPOETIN ALFA (e POE e tin AL fa) helps your body make more red blood cells. This medicine is used to treat anemia caused by chronic kidney failure, cancer chemotherapy, or HIV-therapy. It may also be used before surgery if you have anemia. °This medicine may be used for other purposes; ask your health care provider or pharmacist if you have questions. °COMMON BRAND NAME(S): Epogen, Procrit °What should I tell my health care provider before I take this medicine? °They need to know if you have any of these conditions: °-blood clotting disorders °-cancer patient not on chemotherapy °-cystic fibrosis °-heart disease, such as angina or heart failure °-hemoglobin level of 12 g/dL or greater °-high blood pressure °-low levels of folate, iron, or vitamin B12 °-seizures °-an unusual or allergic reaction to erythropoietin, albumin, benzyl alcohol, hamster proteins, other medicines, foods, dyes, or preservatives °-pregnant or trying to get pregnant °-breast-feeding °How should I use this medicine? °This medicine is for injection into a vein or under the skin. It is usually given by a health care professional in a hospital or clinic setting. °If you get this medicine at home, you will be taught how to prepare and give this medicine. Use exactly as directed. Take your medicine at regular intervals. Do not take your medicine more often than directed. °It is important that you put your used needles and syringes in a special sharps container. Do not put them in a trash can. If you do not have a sharps container, call your pharmacist or healthcare provider to get one. °A special MedGuide will be given to you by the pharmacist with each prescription and refill. Be sure to read this information carefully each time. °Talk to your pediatrician regarding the use of this medicine in children. While this drug may be prescribed for selected conditions, precautions do apply. °Overdosage: If you  think you have taken too much of this medicine contact a poison control center or emergency room at once. °NOTE: This medicine is only for you. Do not share this medicine with others. °What if I miss a dose? °If you miss a dose, take it as soon as you can. If it is almost time for your next dose, take only that dose. Do not take double or extra doses. °What may interact with this medicine? °Do not take this medicine with any of the following medications: °-darbepoetin alfa °This list may not describe all possible interactions. Give your health care provider a list of all the medicines, herbs, non-prescription drugs, or dietary supplements you use. Also tell them if you smoke, drink alcohol, or use illegal drugs. Some items may interact with your medicine. °What should I watch for while using this medicine? °Your condition will be monitored carefully while you are receiving this medicine. °You may need blood work done while you are taking this medicine. °What side effects may I notice from receiving this medicine? °Side effects that you should report to your doctor or health care professional as soon as possible: °-allergic reactions like skin rash, itching or hives, swelling of the face, lips, or tongue °-breathing problems °-changes in vision °-chest pain °-confusion, trouble speaking or understanding °-feeling faint or lightheaded, falls °-high blood pressure °-muscle aches or pains °-pain, swelling, warmth in the leg °-rapid weight gain °-severe headaches °-sudden numbness or weakness of the face, arm or leg °-trouble walking, dizziness, loss of balance or coordination °-seizures (convulsions) °-swelling of the ankles, feet, hands °-unusually weak or tired °  Side effects that usually do not require medical attention (report to your doctor or health care professional if they continue or are bothersome): °-diarrhea °-fever, chills (flu-like symptoms) °-headaches °-nausea, vomiting °-redness, stinging, or swelling at  site where injected °This list may not describe all possible side effects. Call your doctor for medical advice about side effects. You may report side effects to FDA at 1-800-FDA-1088. °Where should I keep my medicine? °Keep out of the reach of children. °Store in a refrigerator between 2 and 8 degrees C (36 and 46 degrees F). Do not freeze or shake. Throw away any unused portion if using a single-dose vial. Multi-dose vials can be kept in the refrigerator for up to 21 days after the initial dose. Throw away unused medicine. °NOTE: This sheet is a summary. It may not cover all possible information. If you have questions about this medicine, talk to your doctor, pharmacist, or health care provider. °© 2018 [employer]vier/Gold Standard (2016-04-17 19:42:31) ° °Ferumoxytol injection °What is this medicine? °FERUMOXYTOL is an iron complex. Iron is used to make healthy red blood cells, which carry oxygen and nutrients throughout the body. This medicine is used to treat iron deficiency anemia in people with chronic kidney disease. °This medicine may be used for other purposes; ask your health care provider or pharmacist if you have questions. °COMMON BRAND NAME(S): Feraheme °What should I tell my health care provider before I take this medicine? °They need to know if you have any of these conditions: °-anemia not caused by low iron levels °-high levels of iron in the blood °-magnetic resonance imaging (MRI) test scheduled °-an unusual or allergic reaction to iron, other medicines, foods, dyes, or preservatives °-pregnant or trying to get pregnant °-breast-feeding °How should I use this medicine? °This medicine is for injection into a vein. It is given by a health care professional in a hospital or clinic setting. °Talk to your pediatrician regarding the use of this medicine in children. Special care may be needed. °Overdosage: If you think you have taken too much of this medicine contact a poison control center or emergency  room at once. °NOTE: This medicine is only for you. Do not share this medicine with others. °What if I miss a dose? °It is important not to miss your dose. Call your doctor or health care professional if you are unable to keep an appointment. °What may interact with this medicine? °This medicine may interact with the following medications: °-other iron products °This list may not describe all possible interactions. Give your health care provider a list of all the medicines, herbs, non-prescription drugs, or dietary supplements you use. Also tell them if you smoke, drink alcohol, or use illegal drugs. Some items may interact with your medicine. °What should I watch for while using this medicine? °Visit your doctor or healthcare professional regularly. Tell your doctor or healthcare professional if your symptoms do not start to get better or if they get worse. You may need blood work done while you are taking this medicine. °You may need to follow a special diet. Talk to your doctor. Foods that contain iron include: whole grains/cereals, dried fruits, beans, or peas, leafy green vegetables, and organ meats (liver, kidney). °What side effects may I notice from receiving this medicine? °Side effects that you should report to your doctor or health care professional as soon as possible: °-allergic reactions like skin rash, itching or hives, swelling of the face, lips, or tongue °-breathing problems °-changes in blood pressure °-feeling   faint or lightheaded, falls °-fever or chills °-flushing, sweating, or hot feelings °-swelling of the ankles or feet °Side effects that usually do not require medical attention (report to your doctor or health care professional if they continue or are bothersome): °-diarrhea °-headache °-nausea, vomiting °-stomach pain °This list may not describe all possible side effects. Call your doctor for medical advice about side effects. You may report side effects to FDA at 1-800-FDA-1088. °Where  should I keep my medicine? °This drug is given in a hospital or clinic and will not be stored at home. °NOTE: This sheet is a summary. It may not cover all possible information. If you have questions about this medicine, talk to your doctor, pharmacist, or health care provider. °© 2018 Elsevier/Gold Standard (2015-09-30 12:41:49) ° ° ° °

## 2018-04-19 ENCOUNTER — Other Ambulatory Visit (HOSPITAL_COMMUNITY): Payer: Self-pay | Admitting: *Deleted

## 2018-04-22 ENCOUNTER — Ambulatory Visit (HOSPITAL_COMMUNITY)
Admission: RE | Admit: 2018-04-22 | Discharge: 2018-04-22 | Disposition: A | Discharge status: Home / Self Care | Payer: BLUE CROSS/BLUE SHIELD | Source: Ambulatory Visit | Attending: Nephrology | Admitting: Nephrology

## 2018-04-22 DIAGNOSIS — D631 Anemia in chronic kidney disease: Secondary | ICD-10-CM | POA: Diagnosis not present

## 2018-04-22 DIAGNOSIS — N189 Chronic kidney disease, unspecified: Secondary | ICD-10-CM | POA: Diagnosis not present

## 2018-04-22 MED ORDER — SODIUM CHLORIDE 0.9 % IV SOLN
510.0000 mg | INTRAVENOUS | Status: AC
  Administered 2018-04-22: 510 mg via INTRAVENOUS
  Filled 2018-04-22: qty 17

## 2018-05-07 DIAGNOSIS — Z01818 Encounter for other preprocedural examination: Secondary | ICD-10-CM | POA: Diagnosis not present

## 2018-05-14 ENCOUNTER — Encounter (HOSPITAL_COMMUNITY)
Admission: RE | Admit: 2018-05-14 | Discharge: 2018-05-14 | Disposition: A | Discharge status: Home / Self Care | Payer: BLUE CROSS/BLUE SHIELD | Source: Ambulatory Visit | Attending: Nephrology | Admitting: Nephrology

## 2018-05-14 ENCOUNTER — Encounter (HOSPITAL_COMMUNITY): Payer: BLUE CROSS/BLUE SHIELD

## 2018-05-14 VITALS — BP 159/83 | HR 77 | Temp 97.4°F | Resp 20

## 2018-05-14 DIAGNOSIS — N183 Chronic kidney disease, stage 3 unspecified: Secondary | ICD-10-CM

## 2018-05-14 DIAGNOSIS — E1122 Type 2 diabetes mellitus with diabetic chronic kidney disease: Secondary | ICD-10-CM | POA: Diagnosis not present

## 2018-05-14 LAB — FERRITIN: Ferritin: 309 ng/mL — ABNORMAL HIGH (ref 11–307)

## 2018-05-14 LAB — IRON AND TIBC
IRON: 102 ug/dL (ref 28–170)
Saturation Ratios: 40 % — ABNORMAL HIGH (ref 10.4–31.8)
TIBC: 258 ug/dL (ref 250–450)
UIBC: 156 ug/dL

## 2018-05-14 LAB — POCT HEMOGLOBIN-HEMACUE: Hemoglobin: 11.2 g/dL — ABNORMAL LOW (ref 12.0–15.0)

## 2018-05-14 MED ORDER — EPOETIN ALFA 20000 UNIT/ML IJ SOLN
20000.0000 [IU] | INTRAMUSCULAR | Status: DC
  Administered 2018-05-14: 20000 [IU] via SUBCUTANEOUS

## 2018-05-14 MED ORDER — EPOETIN ALFA 20000 UNIT/ML IJ SOLN
INTRAMUSCULAR | Status: AC
  Filled 2018-05-14: qty 1

## 2018-05-21 DIAGNOSIS — R809 Proteinuria, unspecified: Secondary | ICD-10-CM | POA: Diagnosis not present

## 2018-05-21 DIAGNOSIS — N189 Chronic kidney disease, unspecified: Secondary | ICD-10-CM | POA: Diagnosis not present

## 2018-05-21 DIAGNOSIS — E1165 Type 2 diabetes mellitus with hyperglycemia: Secondary | ICD-10-CM | POA: Diagnosis not present

## 2018-05-21 DIAGNOSIS — I10 Essential (primary) hypertension: Secondary | ICD-10-CM | POA: Diagnosis not present

## 2018-06-03 DIAGNOSIS — E669 Obesity, unspecified: Secondary | ICD-10-CM | POA: Diagnosis not present

## 2018-06-03 DIAGNOSIS — N185 Chronic kidney disease, stage 5: Secondary | ICD-10-CM | POA: Diagnosis not present

## 2018-06-03 DIAGNOSIS — Z794 Long term (current) use of insulin: Secondary | ICD-10-CM | POA: Diagnosis not present

## 2018-06-03 DIAGNOSIS — E1122 Type 2 diabetes mellitus with diabetic chronic kidney disease: Secondary | ICD-10-CM | POA: Diagnosis not present

## 2018-06-03 DIAGNOSIS — I120 Hypertensive chronic kidney disease with stage 5 chronic kidney disease or end stage renal disease: Secondary | ICD-10-CM | POA: Diagnosis not present

## 2018-06-03 DIAGNOSIS — Z01818 Encounter for other preprocedural examination: Secondary | ICD-10-CM | POA: Diagnosis not present

## 2018-06-03 DIAGNOSIS — N186 End stage renal disease: Secondary | ICD-10-CM | POA: Diagnosis not present

## 2018-06-11 ENCOUNTER — Encounter (HOSPITAL_COMMUNITY): Payer: BLUE CROSS/BLUE SHIELD

## 2018-06-11 ENCOUNTER — Inpatient Hospital Stay (HOSPITAL_COMMUNITY): Admission: RE | Admit: 2018-06-11 | Payer: BLUE CROSS/BLUE SHIELD | Source: Ambulatory Visit

## 2018-06-18 DIAGNOSIS — I10 Essential (primary) hypertension: Secondary | ICD-10-CM | POA: Diagnosis not present

## 2018-06-18 DIAGNOSIS — N189 Chronic kidney disease, unspecified: Secondary | ICD-10-CM | POA: Diagnosis not present

## 2018-06-18 DIAGNOSIS — N183 Chronic kidney disease, stage 3 (moderate): Secondary | ICD-10-CM | POA: Diagnosis not present

## 2018-06-18 DIAGNOSIS — E1121 Type 2 diabetes mellitus with diabetic nephropathy: Secondary | ICD-10-CM | POA: Diagnosis not present

## 2018-06-18 DIAGNOSIS — N184 Chronic kidney disease, stage 4 (severe): Secondary | ICD-10-CM | POA: Diagnosis not present

## 2018-06-18 DIAGNOSIS — D631 Anemia in chronic kidney disease: Secondary | ICD-10-CM | POA: Diagnosis not present

## 2018-06-24 ENCOUNTER — Other Ambulatory Visit: Payer: Self-pay

## 2018-06-24 DIAGNOSIS — N185 Chronic kidney disease, stage 5: Secondary | ICD-10-CM

## 2018-07-03 DIAGNOSIS — N189 Chronic kidney disease, unspecified: Secondary | ICD-10-CM | POA: Diagnosis not present

## 2018-07-03 DIAGNOSIS — I120 Hypertensive chronic kidney disease with stage 5 chronic kidney disease or end stage renal disease: Secondary | ICD-10-CM | POA: Diagnosis not present

## 2018-07-03 DIAGNOSIS — I671 Cerebral aneurysm, nonruptured: Secondary | ICD-10-CM | POA: Diagnosis not present

## 2018-07-03 DIAGNOSIS — E1122 Type 2 diabetes mellitus with diabetic chronic kidney disease: Secondary | ICD-10-CM | POA: Diagnosis not present

## 2018-07-03 DIAGNOSIS — Z8679 Personal history of other diseases of the circulatory system: Secondary | ICD-10-CM | POA: Diagnosis not present

## 2018-07-03 DIAGNOSIS — N185 Chronic kidney disease, stage 5: Secondary | ICD-10-CM | POA: Diagnosis not present

## 2018-07-03 DIAGNOSIS — Z01818 Encounter for other preprocedural examination: Secondary | ICD-10-CM | POA: Diagnosis not present

## 2018-07-09 ENCOUNTER — Encounter (HOSPITAL_COMMUNITY): Payer: BLUE CROSS/BLUE SHIELD

## 2018-07-22 DIAGNOSIS — N184 Chronic kidney disease, stage 4 (severe): Secondary | ICD-10-CM | POA: Diagnosis not present

## 2018-07-22 DIAGNOSIS — I10 Essential (primary) hypertension: Secondary | ICD-10-CM | POA: Diagnosis not present

## 2018-07-22 DIAGNOSIS — Z01818 Encounter for other preprocedural examination: Secondary | ICD-10-CM | POA: Diagnosis not present

## 2018-07-22 DIAGNOSIS — N186 End stage renal disease: Secondary | ICD-10-CM | POA: Diagnosis not present

## 2018-07-22 DIAGNOSIS — N185 Chronic kidney disease, stage 5: Secondary | ICD-10-CM | POA: Diagnosis not present

## 2018-07-22 DIAGNOSIS — E118 Type 2 diabetes mellitus with unspecified complications: Secondary | ICD-10-CM | POA: Diagnosis not present

## 2018-07-22 DIAGNOSIS — D631 Anemia in chronic kidney disease: Secondary | ICD-10-CM | POA: Diagnosis not present

## 2018-08-07 ENCOUNTER — Ambulatory Visit (HOSPITAL_COMMUNITY)
Admission: RE | Admit: 2018-08-07 | Discharge: 2018-08-07 | Disposition: A | Discharge status: Home / Self Care | Payer: BLUE CROSS/BLUE SHIELD | Source: Ambulatory Visit | Attending: Vascular Surgery | Admitting: Vascular Surgery

## 2018-08-07 ENCOUNTER — Other Ambulatory Visit: Payer: Self-pay | Admitting: *Deleted

## 2018-08-07 ENCOUNTER — Encounter: Payer: Self-pay | Admitting: *Deleted

## 2018-08-07 ENCOUNTER — Other Ambulatory Visit: Payer: Self-pay

## 2018-08-07 ENCOUNTER — Encounter: Payer: Self-pay | Admitting: Vascular Surgery

## 2018-08-07 ENCOUNTER — Ambulatory Visit (INDEPENDENT_AMBULATORY_CARE_PROVIDER_SITE_OTHER)
Admission: RE | Admit: 2018-08-07 | Discharge: 2018-08-07 | Disposition: A | Discharge status: Home / Self Care | Payer: BLUE CROSS/BLUE SHIELD | Source: Ambulatory Visit | Attending: Vascular Surgery | Admitting: Vascular Surgery

## 2018-08-07 ENCOUNTER — Ambulatory Visit: Payer: BLUE CROSS/BLUE SHIELD | Admitting: Vascular Surgery

## 2018-08-07 VITALS — BP 168/92 | HR 75 | Temp 98.2°F | Resp 20 | Ht 62.0 in | Wt 187.7 lb

## 2018-08-07 DIAGNOSIS — N185 Chronic kidney disease, stage 5: Secondary | ICD-10-CM | POA: Diagnosis not present

## 2018-08-07 NOTE — Progress Notes (Signed)
REASON FOR CONSULT:    To evaluate for hemodialysis access.  The consult is requested by Dr. Erling Cruz.  HPI:   Allison Durham is a pleasant 52 y.o. female, who was referred for evaluation of hemodialysis access.  I have reviewed the records from the referring office.  The patient was seen on 06/18/2018.  The patient has stage V chronic kidney disease secondary to diabetic nephropathy.  I reviewed the labs that were done on 06/18/2018 and this shows a GFR of 9 with a creatinine of 5.1.  On the referring note we were asked to place an AV fistula.  It does not comment on whether or not we should wait on an AV graft if an AV fistula is not possible.  The patient is right-handed.  She is not yet on dialysis.  She denies any recent uremic symptoms.  Specifically, she denies nausea, vomiting, fatigue, anorexia, or palpitations.  Past Medical History:  Diagnosis Date  . Anemia   . Cerebral aneurysm   . Diabetes mellitus   . GERD (gastroesophageal reflux disease)   . Headache(784.0)    2 per week  . Hypertension    PT. SEES DR. Nancy Fetter AT [facility]  . Recurrent upper respiratory infection (URI)    1 -2 months ago, z-pack    History reviewed. No pertinent family history.  SOCIAL HISTORY: Social History   Socioeconomic History  . Marital status: Single    Spouse name: Not on file  . Number of children: Not on file  . Years of education: Not on file  . Highest education level: Not on file  Occupational History  . Not on file  Social Needs  . Financial resource strain: Not on file  . Food insecurity:    Worry: Not on file    Inability: Not on file  . Transportation needs:    Medical: Not on file    Non-medical: Not on file  Tobacco Use  . Smoking status: Never Smoker  . Smokeless tobacco: Never Used  Substance and Sexual Activity  . Alcohol use: Yes    Comment: occasional  . Drug use: No  . Sexual activity: Yes    Birth control/protection: None  Lifestyle   . Physical activity:    Days per week: Not on file    Minutes per session: Not on file  . Stress: Not on file  Relationships  . Social connections:    Talks on phone: Not on file    Gets together: Not on file    Attends religious service: Not on file    Active member of club or organization: Not on file    Attends meetings of clubs or organizations: Not on file    Relationship status: Not on file  . Intimate partner violence:    Fear of current or ex partner: Not on file    Emotionally abused: Not on file    Physically abused: Not on file    Forced sexual activity: Not on file  Other Topics Concern  . Not on file  Social History Narrative  . Not on file    No Known Allergies  Current Outpatient Medications  Medication Sig Dispense Refill  . amLODipine (NORVASC) 10 MG tablet Take 10 mg by mouth daily.      Marland Kitchen aspirin 325 MG EC tablet Take 325 mg by mouth daily as needed for pain.     . cholecalciferol (VITAMIN D) 1000 UNITS tablet Take 1,000 Units by  mouth 2 (two) times daily.     . ferrous sulfate 325 (65 FE) MG tablet Take 325 mg by mouth 2 (two) times daily.    . insulin aspart protamine-insulin aspart (NOVOLOG 70/30) (70-30) 100 UNIT/ML injection Inject 40 Units into the skin 2 (two) times daily with a meal.     . latanoprost (XALATAN) 0.005 % ophthalmic solution latanoprost 0.005 % eye drops    . metoprolol succinate (TOPROL-XL) 25 MG 24 hr tablet Take 0.5 tablets (12.5 mg total) by mouth daily. 30 tablet 0  . Multiple Vitamin (MULTIVITAMIN WITH MINERALS) TABS Take 1 tablet by mouth daily.    Marland Kitchen omeprazole (PRILOSEC) 20 MG capsule Take 20 mg by mouth daily.    . Travoprost, BAK Free, (TRAVATAN) 0.004 % SOLN ophthalmic solution Place 1 drop into both eyes at bedtime.     No current facility-administered medications for this visit.     REVIEW OF SYSTEMS:  [X]  denotes positive finding, [ ]  denotes negative finding Cardiac  Comments:  Chest pain or chest pressure:      Shortness of breath upon exertion: x   Short of breath when lying flat:    Irregular heart rhythm:        Vascular    Pain in calf, thigh, or hip brought on by ambulation:    Pain in feet at night that wakes you up from your sleep:     Blood clot in your veins:    Leg swelling:         Pulmonary    Oxygen at home:    Productive cough:     Wheezing:         Neurologic    Sudden weakness in arms or legs:     Sudden numbness in arms or legs:     Sudden onset of difficulty speaking or slurred speech:    Temporary loss of vision in one eye:     Problems with dizziness:         Gastrointestinal    Blood in stool:     Vomited blood:         Genitourinary    Burning when urinating:     Blood in urine:        Psychiatric    Major depression:         Hematologic    Bleeding problems:    Problems with blood clotting too easily:        Skin    Rashes or ulcers:        Constitutional    Fever or chills:     PHYSICAL EXAM:   Vitals:   08/07/18 1103  BP: (!) 168/92  Pulse: 75  Resp: 20  Temp: 98.2 F (36.8 C)  SpO2: 100%  Weight: 187 lb 11.2 oz (85.1 kg)  Height: 5\' 2"  (1.575 m)    GENERAL: The patient is a well-nourished female, in no acute distress. The vital signs are documented above. CARDIAC: There is a regular rate and rhythm.  VASCULAR: I do not detect carotid bruits. She has palpable brachial and radial pulses bilaterally. PULMONARY: There is good air exchange bilaterally without wheezing or rales. ABDOMEN: Soft and non-tender with normal pitched bowel sounds.  MUSCULOSKELETAL: There are no major deformities or cyanosis. NEUROLOGIC: No focal weakness or paresthesias are detected. SKIN: There are no ulcers or rashes noted. PSYCHIATRIC: The patient has a normal affect.  DATA:    ARTERIAL DOPPLER STUDY: I have benefit of interpreted his  arterial Doppler study today.  On the right side there is a triphasic radial and ulnar signal.  The brachial artery  measures 0.5 cm in diameter.  On the left side there is a triphasic radial and ulnar signal.  The brachial artery measures 0.48 cm in diameter.  UPPER EXTREMITY VEIN MAP: I have independently interpreted his upper extremity vein map.  On the right side the upper arm cephalic vein looks potentially usable.  The forearm cephalic vein is small.  The basilic vein on the right is small.  On the left side the forearm cephalic vein does not appear to be adequate.  The upper arm cephalic vein is marginal in size.  The basilic vein is marginal in size.   ASSESSMENT & PLAN:   STAGE V CHRONIC KIDNEY DISEASE: Based on her vein map I think her best option for a fistula would be a right brachiocephalic fistula.  If at all possible we will try this.  Her GFR is only 9 and for this reason I think if the fistula is not possible we should place a graft. I have explained the indications for placement of an AV fistula or AV graft. I've explained that if at all possible we will place an AV fistula.  I have reviewed the risks of placement of an AV fistula including but not limited to: failure of the fistula to mature, need for subsequent interventions, and thrombosis. In addition I have reviewed the potential complications of placement of an AV graft. These risks include, but are not limited to, graft thrombosis, graft infection, wound healing problems, bleeding, arm swelling, and steal syndrome. All the patient's questions were answered and they are agreeable to proceed with surgery.  Her surgery has been scheduled for 08/26/2018.    Deitra [facility] [place] [phone]

## 2018-08-07 NOTE — H&P (View-Only) (Signed)
REASON FOR CONSULT:    To evaluate for hemodialysis access.  The consult is requested by Dr. Erling Cruz.  HPI:   Allison Durham is a pleasant 52 y.o. female, who was referred for evaluation of hemodialysis access.  I have reviewed the records from the referring office.  The patient was seen on 06/18/2018.  The patient has stage V chronic kidney disease secondary to diabetic nephropathy.  I reviewed the labs that were done on 06/18/2018 and this shows a GFR of 9 with a creatinine of 5.1.  On the referring note we were asked to place an AV fistula.  It does not comment on whether or not we should wait on an AV graft if an AV fistula is not possible.  The patient is right-handed.  She is not yet on dialysis.  She denies any recent uremic symptoms.  Specifically, she denies nausea, vomiting, fatigue, anorexia, or palpitations.  Past Medical History:  Diagnosis Date  . Anemia   . Cerebral aneurysm   . Diabetes mellitus   . GERD (gastroesophageal reflux disease)   . Headache(784.0)    2 per week  . Hypertension    PT. SEES DR. Nancy Fetter AT [place]  . Recurrent upper respiratory infection (URI)    1 -2 months ago, z-pack    History reviewed. No pertinent family history.  SOCIAL HISTORY: Social History   Socioeconomic History  . Marital status: Single    Spouse name: Not on file  . Number of children: Not on file  . Years of education: Not on file  . Highest education level: Not on file  Occupational History  . Not on file  Social Needs  . Financial resource strain: Not on file  . Food insecurity:    Worry: Not on file    Inability: Not on file  . Transportation needs:    Medical: Not on file    Non-medical: Not on file  Tobacco Use  . Smoking status: Never Smoker  . Smokeless tobacco: Never Used  Substance and Sexual Activity  . Alcohol use: Yes    Comment: occasional  . Drug use: No  . Sexual activity: Yes    Birth control/protection: None  Lifestyle   . Physical activity:    Days per week: Not on file    Minutes per session: Not on file  . Stress: Not on file  Relationships  . Social connections:    Talks on phone: Not on file    Gets together: Not on file    Attends religious service: Not on file    Active member of club or organization: Not on file    Attends meetings of clubs or organizations: Not on file    Relationship status: Not on file  . Intimate partner violence:    Fear of current or ex partner: Not on file    Emotionally abused: Not on file    Physically abused: Not on file    Forced sexual activity: Not on file  Other Topics Concern  . Not on file  Social History Narrative  . Not on file    No Known Allergies  Current Outpatient Medications  Medication Sig Dispense Refill  . amLODipine (NORVASC) 10 MG tablet Take 10 mg by mouth daily.      Marland Kitchen aspirin 325 MG EC tablet Take 325 mg by mouth daily as needed for pain.     . cholecalciferol (VITAMIN D) 1000 UNITS tablet Take 1,000 Units by  mouth 2 (two) times daily.     . ferrous sulfate 325 (65 FE) MG tablet Take 325 mg by mouth 2 (two) times daily.    . insulin aspart protamine-insulin aspart (NOVOLOG 70/30) (70-30) 100 UNIT/ML injection Inject 40 Units into the skin 2 (two) times daily with a meal.     . latanoprost (XALATAN) 0.005 % ophthalmic solution latanoprost 0.005 % eye drops    . metoprolol succinate (TOPROL-XL) 25 MG 24 hr tablet Take 0.5 tablets (12.5 mg total) by mouth daily. 30 tablet 0  . Multiple Vitamin (MULTIVITAMIN WITH MINERALS) TABS Take 1 tablet by mouth daily.    Marland Kitchen omeprazole (PRILOSEC) 20 MG capsule Take 20 mg by mouth daily.    . Travoprost, BAK Free, (TRAVATAN) 0.004 % SOLN ophthalmic solution Place 1 drop into both eyes at bedtime.     No current facility-administered medications for this visit.     REVIEW OF SYSTEMS:  [X]  denotes positive finding, [ ]  denotes negative finding Cardiac  Comments:  Chest pain or chest pressure:      Shortness of breath upon exertion: x   Short of breath when lying flat:    Irregular heart rhythm:        Vascular    Pain in calf, thigh, or hip brought on by ambulation:    Pain in feet at night that wakes you up from your sleep:     Blood clot in your veins:    Leg swelling:         Pulmonary    Oxygen at home:    Productive cough:     Wheezing:         Neurologic    Sudden weakness in arms or legs:     Sudden numbness in arms or legs:     Sudden onset of difficulty speaking or slurred speech:    Temporary loss of vision in one eye:     Problems with dizziness:         Gastrointestinal    Blood in stool:     Vomited blood:         Genitourinary    Burning when urinating:     Blood in urine:        Psychiatric    Major depression:         Hematologic    Bleeding problems:    Problems with blood clotting too easily:        Skin    Rashes or ulcers:        Constitutional    Fever or chills:     PHYSICAL EXAM:   Vitals:   08/07/18 1103  BP: (!) 168/92  Pulse: 75  Resp: 20  Temp: 98.2 F (36.8 C)  SpO2: 100%  Weight: 187 lb 11.2 oz (85.1 kg)  Height: 5\' 2"  (1.575 m)    GENERAL: The patient is a well-nourished female, in no acute distress. The vital signs are documented above. CARDIAC: There is a regular rate and rhythm.  VASCULAR: I do not detect carotid bruits. She has palpable brachial and radial pulses bilaterally. PULMONARY: There is good air exchange bilaterally without wheezing or rales. ABDOMEN: Soft and non-tender with normal pitched bowel sounds.  MUSCULOSKELETAL: There are no major deformities or cyanosis. NEUROLOGIC: No focal weakness or paresthesias are detected. SKIN: There are no ulcers or rashes noted. PSYCHIATRIC: The patient has a normal affect.  DATA:    ARTERIAL DOPPLER STUDY: I have benefit of interpreted his  arterial Doppler study today.  On the right side there is a triphasic radial and ulnar signal.  The brachial artery  measures 0.5 cm in diameter.  On the left side there is a triphasic radial and ulnar signal.  The brachial artery measures 0.48 cm in diameter.  UPPER EXTREMITY VEIN MAP: I have independently interpreted his upper extremity vein map.  On the right side the upper arm cephalic vein looks potentially usable.  The forearm cephalic vein is small.  The basilic vein on the right is small.  On the left side the forearm cephalic vein does not appear to be adequate.  The upper arm cephalic vein is marginal in size.  The basilic vein is marginal in size.   ASSESSMENT & PLAN:   STAGE V CHRONIC KIDNEY DISEASE: Based on her vein map I think her best option for a fistula would be a right brachiocephalic fistula.  If at all possible we will try this.  Her GFR is only 9 and for this reason I think if the fistula is not possible we should place a graft. I have explained the indications for placement of an AV fistula or AV graft. I've explained that if at all possible we will place an AV fistula.  I have reviewed the risks of placement of an AV fistula including but not limited to: failure of the fistula to mature, need for subsequent interventions, and thrombosis. In addition I have reviewed the potential complications of placement of an AV graft. These risks include, but are not limited to, graft thrombosis, graft infection, wound healing problems, bleeding, arm swelling, and steal syndrome. All the patient's questions were answered and they are agreeable to proceed with surgery.  Her surgery has been scheduled for 08/26/2018.    Deitra Mayo [facility] [phone]

## 2018-08-13 ENCOUNTER — Encounter: Payer: Self-pay | Admitting: Nephrology

## 2018-08-23 ENCOUNTER — Encounter (HOSPITAL_COMMUNITY): Payer: Self-pay | Admitting: *Deleted

## 2018-08-23 ENCOUNTER — Other Ambulatory Visit: Payer: Self-pay

## 2018-08-23 NOTE — Progress Notes (Signed)
Anesthesia Chart Review: Allison Durham   Case:  [id number] Date/Time:  08/26/18 0715   Procedure:  ARTERIOVENOUS (AV) BRACHIOCEPHALIC FISTULA CREATION VERSUS GRAFT INSERTION (Right )   Anesthesia type:  Monitor Anesthesia Care   Pre-op diagnosis:  CHRONIC KIDNEY DISEASE   Location:  [facility] OR ROOM 16 / [place] OR   Surgeon:  Angelia Mould, MD      DISCUSSION: Patient is a 52 year old female scheduled for the above procedure.   History includes never smoker,  CKD stage V, DM2, SAH 12/2010 (ruptured left PCA aneurysm, s/p endovascular coiling 12/12/10), GERD, HTN, anemia, glaucoma.   She in undergoing pre-kidney transplant work-up at [facility].  She is for labs on arrival. If labs acceptable and no acute changes then I would anticipate that she can proceed as planned.   VS: Wt 85.1 kg Comment: 11.27.19  LMP 06/08/2011   BMI 34.33 kg/m   PROVIDERS: Donald Prose, MD is PCP  Erling Cruz, MD is nephrologist at [facility].    LABS: She is for labs on arrival.   IMAGES: MRA Brain 07/03/18 ([place]): Result Impression: 1. Approximately 4 mm left posterior communicator artery remnant aneurysm of the previously coiled posterior communicating artery aneurysm. The left posterior communicator artery appears to arise from the neck of the aneurysm. . 2. No evidence of significant (50% or greater) stenosis or occlusion.  CXR 06/03/18 [facility] Everywhere): Result Impression:  There is no evidence of acute cardiac or pulmonary abnormality.   EKG: 06/03/18 [facility]): SR, anteroseptal infarct (age undetermined).   CV: Below testing done routinely as part of pre-transplant work-up.  Nuclear stress test 07/22/18 ([place]): Result Impression: No inducible ischemia. Normal left ventricular function.EF 63%.  Stress echo 07/03/18 ([place]): STRESS ECHO Normal left ventricular function and global wall motion with stress.  Nondiagnostic  exercise echocardiography due to subtarget heart rate.  Echo 07/03/18 ([place]): SUMMARY The left ventricular size is normal. There is normal left ventricular wall thickness. Left ventricular systolic function is normal. LV ejection fraction = 55-60%. No segmental wall motion abnormalities seen in the left ventricle Left ventricular filling pattern is prolonged relaxation. The right ventricle is normal in size and function. Probably no significant change in comparison with the prior study noted There is no pericardial effusion. There is no comparison study available.  Carotid U/S 07/03/18 [facility]): Report not currently available.    Past Medical History:  Diagnosis Date  . Anemia   . Cerebral aneurysm   . Chronic kidney disease   . Diabetes mellitus   . GERD (gastroesophageal reflux disease)   . Glaucoma   . Headache(784.0)    2 per week  . Hypertension    PT. SEES DR. Nancy Fetter AT [place]  . Pneumonia   . Recurrent upper respiratory infection (URI)    1 -2 months ago, z-pack  . Wears dentures   . Wears glasses     Past Surgical History:  Procedure Laterality Date  . CEREBRAL ANEURYSM REPAIR    . COLONOSCOPY    . MULTIPLE TOOTH EXTRACTIONS    . TUBAL LIGATION      MEDICATIONS: No current facility-administered medications for this encounter.    Marland Kitchen allopurinol (ZYLOPRIM) 100 MG tablet  . amLODipine (NORVASC) 10 MG tablet  . aspirin 325 MG EC tablet  . calcitRIOL (ROCALTROL) 0.5 MCG capsule  . cholecalciferol (VITAMIN D) 1000 UNITS tablet  . ferrous sulfate 325 (65 FE)  MG tablet  . furosemide (LASIX) 80 MG tablet  . insulin aspart protamine-insulin aspart (NOVOLOG 70/30) (70-30) 100 UNIT/ML injection  . latanoprost (XALATAN) 0.005 % ophthalmic solution  . metoprolol succinate (TOPROL-XL) 25 MG 24 hr tablet  . Multiple Vitamin (MULTIVITAMIN WITH MINERALS) TABS    George Hugh [facility] Short Stay Center/Anesthesiology Phone  [phone] 08/23/2018 3:14 PM

## 2018-08-23 NOTE — Progress Notes (Signed)
Pt denies any acute cardiopulmonary issues. Pt denies being under the care of a cardiologist. Pt denies having a cardiac cath. Requested EKG tracing from [facility]. Pt made aware to take 14 units Novolog 70/30 the night before surgery and no insulin the morning of surgery. Pt made aware to check BG every 2 hours prior to arrival to hospital on DOS. Pt made aware to treat a BG < 70 with  4 ounces of apple juice, wait 15 minutes after intervention to recheck BG, if BG remains < 70, call Short Stay unit to speak with a nurse.  Pt verbalized understanding of all pre-op instructions.

## 2018-08-26 ENCOUNTER — Ambulatory Visit (HOSPITAL_COMMUNITY): Payer: BLUE CROSS/BLUE SHIELD | Admitting: Vascular Surgery

## 2018-08-26 ENCOUNTER — Encounter (HOSPITAL_COMMUNITY): Payer: Self-pay | Admitting: Surgery

## 2018-08-26 ENCOUNTER — Telehealth: Payer: Self-pay | Admitting: Vascular Surgery

## 2018-08-26 ENCOUNTER — Encounter (HOSPITAL_COMMUNITY)
Admission: RE | Disposition: A | Discharge status: Home / Self Care | Payer: Self-pay | Source: Home / Self Care | Attending: Vascular Surgery

## 2018-08-26 ENCOUNTER — Ambulatory Visit (HOSPITAL_COMMUNITY)
Admission: RE | Admit: 2018-08-26 | Discharge: 2018-08-26 | Disposition: A | Discharge status: Home / Self Care | Payer: BLUE CROSS/BLUE SHIELD | Attending: Vascular Surgery | Admitting: Vascular Surgery

## 2018-08-26 ENCOUNTER — Other Ambulatory Visit: Payer: Self-pay

## 2018-08-26 DIAGNOSIS — I120 Hypertensive chronic kidney disease with stage 5 chronic kidney disease or end stage renal disease: Secondary | ICD-10-CM | POA: Insufficient documentation

## 2018-08-26 DIAGNOSIS — Z794 Long term (current) use of insulin: Secondary | ICD-10-CM | POA: Diagnosis not present

## 2018-08-26 DIAGNOSIS — Z79899 Other long term (current) drug therapy: Secondary | ICD-10-CM | POA: Diagnosis not present

## 2018-08-26 DIAGNOSIS — N185 Chronic kidney disease, stage 5: Secondary | ICD-10-CM | POA: Insufficient documentation

## 2018-08-26 DIAGNOSIS — E1122 Type 2 diabetes mellitus with diabetic chronic kidney disease: Secondary | ICD-10-CM | POA: Diagnosis not present

## 2018-08-26 DIAGNOSIS — Z7982 Long term (current) use of aspirin: Secondary | ICD-10-CM | POA: Diagnosis not present

## 2018-08-26 DIAGNOSIS — Z8679 Personal history of other diseases of the circulatory system: Secondary | ICD-10-CM | POA: Insufficient documentation

## 2018-08-26 DIAGNOSIS — K219 Gastro-esophageal reflux disease without esophagitis: Secondary | ICD-10-CM | POA: Diagnosis not present

## 2018-08-26 HISTORY — DX: Presence of dental prosthetic device (complete) (partial): Z97.2

## 2018-08-26 HISTORY — PX: AV FISTULA PLACEMENT: SHX1204

## 2018-08-26 HISTORY — DX: Presence of spectacles and contact lenses: Z97.3

## 2018-08-26 HISTORY — DX: Unspecified glaucoma: H40.9

## 2018-08-26 HISTORY — DX: Chronic kidney disease, unspecified: N18.9

## 2018-08-26 HISTORY — DX: Pneumonia, unspecified organism: J18.9

## 2018-08-26 LAB — POCT I-STAT 4, (NA,K, GLUC, HGB,HCT)
Glucose, Bld: 129 mg/dL — ABNORMAL HIGH (ref 70–99)
HEMATOCRIT: 25 % — AB (ref 36.0–46.0)
HEMOGLOBIN: 8.5 g/dL — AB (ref 12.0–15.0)
Potassium: 3.8 mmol/L (ref 3.5–5.1)
Sodium: 141 mmol/L (ref 135–145)

## 2018-08-26 LAB — GLUCOSE, CAPILLARY
Glucose-Capillary: 119 mg/dL — ABNORMAL HIGH (ref 70–99)
Glucose-Capillary: 158 mg/dL — ABNORMAL HIGH (ref 70–99)

## 2018-08-26 SURGERY — ARTERIOVENOUS (AV) FISTULA CREATION
Anesthesia: General | Site: Arm Upper | Laterality: Right

## 2018-08-26 MED ORDER — PROPOFOL 10 MG/ML IV BOLUS
INTRAVENOUS | Status: AC
  Filled 2018-08-26: qty 20

## 2018-08-26 MED ORDER — HEPARIN SODIUM (PORCINE) 1000 UNIT/ML IJ SOLN
INTRAMUSCULAR | Status: DC | PRN
  Administered 2018-08-26: 8000 [IU] via INTRAVENOUS

## 2018-08-26 MED ORDER — SODIUM CHLORIDE 0.9 % IV SOLN
INTRAVENOUS | Status: DC | PRN
  Administered 2018-08-26: 08:00:00

## 2018-08-26 MED ORDER — LIDOCAINE HCL (PF) 1 % IJ SOLN
INTRAMUSCULAR | Status: AC
  Filled 2018-08-26: qty 30

## 2018-08-26 MED ORDER — CHLORHEXIDINE GLUCONATE 4 % EX LIQD: 60.0000 mL | Freq: Once | CUTANEOUS | Status: DC

## 2018-08-26 MED ORDER — LIDOCAINE-EPINEPHRINE (PF) 1 %-1:200000 IJ SOLN
INTRAMUSCULAR | Status: AC
  Filled 2018-08-26: qty 30

## 2018-08-26 MED ORDER — SODIUM CHLORIDE 0.9 % IV SOLN
INTRAVENOUS | Status: DC
  Administered 2018-08-26: 07:00:00 via INTRAVENOUS

## 2018-08-26 MED ORDER — ONDANSETRON HCL 4 MG/2ML IJ SOLN
INTRAMUSCULAR | Status: DC | PRN
  Administered 2018-08-26: 4 mg via INTRAVENOUS

## 2018-08-26 MED ORDER — FENTANYL CITRATE (PF) 250 MCG/5ML IJ SOLN
INTRAMUSCULAR | Status: AC
  Filled 2018-08-26: qty 5

## 2018-08-26 MED ORDER — MIDAZOLAM HCL 2 MG/2ML IJ SOLN
INTRAMUSCULAR | Status: AC
  Filled 2018-08-26: qty 2

## 2018-08-26 MED ORDER — 0.9 % SODIUM CHLORIDE (POUR BTL) OPTIME
TOPICAL | Status: DC | PRN
  Administered 2018-08-26: 1000 mL

## 2018-08-26 MED ORDER — CEFAZOLIN SODIUM-DEXTROSE 2-4 GM/100ML-% IV SOLN
2.0000 g | INTRAVENOUS | Status: AC
  Administered 2018-08-26: 2 g via INTRAVENOUS
  Filled 2018-08-26: qty 100

## 2018-08-26 MED ORDER — PROPOFOL 500 MG/50ML IV EMUL
INTRAVENOUS | Status: DC | PRN
  Administered 2018-08-26: 30 ug/kg/min via INTRAVENOUS

## 2018-08-26 MED ORDER — OXYCODONE HCL 5 MG PO TABS: 5.0000 mg | ORAL_TABLET | ORAL | 0 refills | Status: DC | PRN

## 2018-08-26 MED ORDER — MIDAZOLAM HCL 5 MG/5ML IJ SOLN
INTRAMUSCULAR | Status: DC | PRN
  Administered 2018-08-26: 2 mg via INTRAVENOUS

## 2018-08-26 MED ORDER — LIDOCAINE-EPINEPHRINE (PF) 1 %-1:200000 IJ SOLN
INTRAMUSCULAR | Status: DC | PRN
  Administered 2018-08-26: 20 mL

## 2018-08-26 MED ORDER — PROTAMINE SULFATE 10 MG/ML IV SOLN
INTRAVENOUS | Status: DC | PRN
  Administered 2018-08-26: 40 mg via INTRAVENOUS

## 2018-08-26 MED ORDER — LIDOCAINE 2% (20 MG/ML) 5 ML SYRINGE
INTRAMUSCULAR | Status: AC
  Filled 2018-08-26: qty 5

## 2018-08-26 MED ORDER — FENTANYL CITRATE (PF) 100 MCG/2ML IJ SOLN
INTRAMUSCULAR | Status: DC | PRN
  Administered 2018-08-26 (×2): 50 ug via INTRAVENOUS

## 2018-08-26 MED ORDER — GLYCOPYRROLATE 0.2 MG/ML IJ SOLN
INTRAMUSCULAR | Status: DC | PRN
  Administered 2018-08-26: 0.1 mg via INTRAVENOUS

## 2018-08-26 MED ORDER — FENTANYL CITRATE (PF) 100 MCG/2ML IJ SOLN: 25.0000 ug | INTRAMUSCULAR | Status: DC | PRN

## 2018-08-26 MED ORDER — SODIUM CHLORIDE 0.9 % IV SOLN
INTRAVENOUS | Status: AC
  Filled 2018-08-26: qty 1.2

## 2018-08-26 SURGICAL SUPPLY — 31 items
ADH SKN CLS APL DERMABOND .7 (GAUZE/BANDAGES/DRESSINGS) ×1
ARMBAND PINK RESTRICT EXTREMIT (MISCELLANEOUS) ×4 IMPLANT
CANISTER SUCT 3000ML PPV (MISCELLANEOUS) ×2 IMPLANT
CANNULA VESSEL 3MM 2 BLNT TIP (CANNULA) ×2 IMPLANT
CLIP VESOCCLUDE MED 6/CT (CLIP) ×2 IMPLANT
CLIP VESOCCLUDE SM WIDE 6/CT (CLIP) ×2 IMPLANT
COVER PROBE W GEL 5X96 (DRAPES) IMPLANT
COVER WAND RF STERILE (DRAPES) ×2 IMPLANT
DECANTER SPIKE VIAL GLASS SM (MISCELLANEOUS) ×2 IMPLANT
DERMABOND ADVANCED (GAUZE/BANDAGES/DRESSINGS) ×1
DERMABOND ADVANCED .7 DNX12 (GAUZE/BANDAGES/DRESSINGS) ×1 IMPLANT
ELECT REM PT RETURN 9FT ADLT (ELECTROSURGICAL) ×2
ELECTRODE REM PT RTRN 9FT ADLT (ELECTROSURGICAL) ×1 IMPLANT
GLOVE BIO SURGEON STRL SZ7.5 (GLOVE) ×2 IMPLANT
GLOVE BIOGEL PI IND STRL 8 (GLOVE) ×1 IMPLANT
GLOVE BIOGEL PI INDICATOR 8 (GLOVE) ×1
GOWN STRL REUS W/ TWL LRG LVL3 (GOWN DISPOSABLE) ×3 IMPLANT
GOWN STRL REUS W/TWL LRG LVL3 (GOWN DISPOSABLE) ×6
KIT BASIN OR (CUSTOM PROCEDURE TRAY) ×2 IMPLANT
KIT TURNOVER KIT B (KITS) ×2 IMPLANT
NS IRRIG 1000ML POUR BTL (IV SOLUTION) ×2 IMPLANT
PACK CV ACCESS (CUSTOM PROCEDURE TRAY) ×2 IMPLANT
PAD ARMBOARD 7.5X6 YLW CONV (MISCELLANEOUS) ×4 IMPLANT
SPONGE SURGIFOAM ABS GEL 100 (HEMOSTASIS) IMPLANT
SUT PROLENE 6 0 BV (SUTURE) ×2 IMPLANT
SUT VIC AB 3-0 SH 27 (SUTURE) ×2
SUT VIC AB 3-0 SH 27X BRD (SUTURE) ×1 IMPLANT
SUT VICRYL 4-0 PS2 18IN ABS (SUTURE) ×2 IMPLANT
TOWEL GREEN STERILE (TOWEL DISPOSABLE) ×2 IMPLANT
UNDERPAD 30X30 (UNDERPADS AND DIAPERS) ×2 IMPLANT
WATER STERILE IRR 1000ML POUR (IV SOLUTION) ×2 IMPLANT

## 2018-08-26 NOTE — Anesthesia Preprocedure Evaluation (Addendum)
Anesthesia Evaluation  Patient identified by MRN, date of birth, ID band Patient awake    Reviewed: Allergy & Precautions, NPO status , Patient's Chart, lab work & pertinent test results  Airway Mallampati: II  TM Distance: >3 FB     Dental   Pulmonary pneumonia, Recent URI ,    breath sounds clear to auscultation       Cardiovascular hypertension,  Rhythm:Regular Rate:Normal     Neuro/Psych  Headaches,    GI/Hepatic Neg liver ROS, GERD  ,  Endo/Other  diabetes  Renal/GU Renal disease     Musculoskeletal   Abdominal   Peds  Hematology  (+) anemia ,   Anesthesia Other Findings   Reproductive/Obstetrics                            Anesthesia Physical Anesthesia Plan  ASA: IV  Anesthesia Plan: MAC   Post-op Pain Management:    Induction: Intravenous  PONV Risk Score and Plan: 3 and Ondansetron, Midazolam and Treatment may vary due to age or medical condition  Airway Management Planned: Simple Face Mask and Nasal Cannula  Additional Equipment:   Intra-op Plan:   Post-operative Plan:   Informed Consent: I have reviewed the patients History and Physical, chart, labs and discussed the procedure including the risks, benefits and alternatives for the proposed anesthesia with the patient or authorized representative who has indicated his/her understanding and acceptance.   Dental advisory given  Plan Discussed with: Anesthesiologist and CRNA  Anesthesia Plan Comments:        Anesthesia Quick Evaluation

## 2018-08-26 NOTE — Telephone Encounter (Signed)
-----   Message from Mena Goes, RN sent at 08/26/2018 10:12 AM EST ----- Regarding: 6 weeks with duplex and PA appt postop  ----- Message ----- From: Angelia Mould, MD Sent: 08/26/2018   8:34 AM EST To: Vvs Charge Pool Subject: charge                                          PROCEDURE:   Right brachiocephalic AV fistula  SURGEON: Judeth Cornfield. Scot Dock, MD, FACS  ASSIST: Arlee Muslim, PA  She will need a follow-up visit on the PA schedule with a duplex of her right AV fistula in 6 weeks.  Thank you. CD

## 2018-08-26 NOTE — Telephone Encounter (Signed)
sch appt 10/09/2018 1pm Dialysis Duplex 2pm p/o PA

## 2018-08-26 NOTE — Op Note (Signed)
    NAME: Allison Durham    MRN: [id number] DOB: [birth year]    DATE OF OPERATION: 08/26/2018  PREOP DIAGNOSIS:    Stage V chronic kidney disease  POSTOP DIAGNOSIS:    Same  PROCEDURE:    Right brachiocephalic AV fistula  SURGEON: Judeth Cornfield. Scot Dock, MD, FACS  ASSIST: Arlee Muslim, PA  ANESTHESIA: Local with sedation  EBL: Minimal  INDICATIONS:    [name] is a 52 y.o. female who presents for new access.  She is not on dialysis.  FINDINGS:   3.5 mm upper arm cephalic vein.  Palpable radial pulse at the completion of the procedure.  TECHNIQUE:   Patient was brought to the operating room and sedated by anesthesia.  The right upper extremity was prepped and draped in usual sterile fashion.  A looked at the cephalic vein with the SonoSite and it was fairly far lateral and therefore elected to make a separate incision over the cephalic vein and a separate incision over the brachial artery.  After the skin was anesthetized a longitudinal incision was made over the brachial artery which was dissected free and controlled the vessel.  After the skin was anesthetized, a separate longitudinal incision was made over the cephalic vein.  Branches were divided between clips and 3-0 silk ties.  The vein was mobilized and ligated distally.  It irrigated nicely with heparinized saline.  The patient was heparinized.  A tunnel was created between the 2 incisions and the vein brought through the tunnel.  The brachial artery was clamped proximally and distally and a longitudinal arteriotomy was made.  The vein was sewn end-to-side to the artery using continuous 6-0 Prolene suture.  At the completion there was an excellent thrill in the fistula.  There was a palpable radial pulse.  The heparin was partially reversed with protamine.  The wounds were closed with a deep layer of 3-0 Vicryl to skin closed with 4-0 Vicryl.  Dermabond was applied.  The patient tolerated the procedure well and was transferred to  the recovery room in stable condition.  All needle and sponge counts were correct.  Deitra Mayo, MD, FACS [facility]  DATE OF DICTATION:   08/26/2018

## 2018-08-26 NOTE — Transfer of Care (Signed)
Immediate Anesthesia Transfer of Care Note  Patient: Allison Durham  Procedure(s) Performed: ARTERIOVENOUS (AV) BRACHIOCEPHALIC FISTULA CREATION (Right Arm Upper)  Patient Location: PACU  Anesthesia Type:MAC  Level of Consciousness: awake and patient cooperative  Airway & Oxygen Therapy: Patient Spontanous Breathing  Post-op Assessment: Report given to RN and Post -op Vital signs reviewed and stable  Post vital signs: Reviewed and stable  Last Vitals:  Vitals Value Taken Time  BP 137/80 08/26/2018  8:55 AM  Temp    Pulse 82 08/26/2018  8:56 AM  Resp 13 08/26/2018  8:56 AM  SpO2 100 % 08/26/2018  8:56 AM  Vitals shown include unvalidated device data.  Last Pain:  Vitals:   08/26/18 0639  PainSc: 0-No pain      Patients Stated Pain Goal: 0 (52/48/18 5909)  Complications: No apparent anesthesia complications

## 2018-08-26 NOTE — Interval H&P Note (Signed)
History and Physical Interval Note:  08/26/2018 7:01 AM  Allison Durham  has presented today for surgery, with the diagnosis of CHRONIC KIDNEY DISEASE  The various methods of treatment have been discussed with the patient and family. After consideration of risks, benefits and other options for treatment, the patient has consented to  Procedure(s): ARTERIOVENOUS (AV) BRACHIOCEPHALIC FISTULA CREATION VERSUS GRAFT INSERTION (Right) as a surgical intervention .  The patient's history has been reviewed, patient examined, no change in status, stable for surgery.  I have reviewed the patient's chart and labs.  Questions were answered to the patient's satisfaction.     Deitra Mayo

## 2018-08-27 ENCOUNTER — Encounter (HOSPITAL_COMMUNITY): Payer: Self-pay | Admitting: Vascular Surgery

## 2018-08-27 NOTE — Anesthesia Postprocedure Evaluation (Signed)
Anesthesia Post Note  Patient: Allison Durham  Procedure(s) Performed: ARTERIOVENOUS (AV) BRACHIOCEPHALIC FISTULA CREATION (Right Arm Upper)     Patient location during evaluation: PACU Anesthesia Type: MAC Level of consciousness: awake Vital Signs Assessment: post-procedure vital signs reviewed and stable Respiratory status: spontaneous breathing Postop Assessment: no apparent nausea or vomiting Anesthetic complications: no    Last Vitals:  Vitals:   08/26/18 0925 08/26/18 0946  BP: (!) 143/79 (!) 137/92  Pulse: 76 77  Resp: 17 16  Temp: 36.7 C   SpO2: 99% 99%    Last Pain:  Vitals:   08/26/18 0925  PainSc: 0-No pain                 Srihitha Tagliaferri

## 2018-09-09 DIAGNOSIS — I10 Essential (primary) hypertension: Secondary | ICD-10-CM | POA: Diagnosis not present

## 2018-09-09 DIAGNOSIS — D631 Anemia in chronic kidney disease: Secondary | ICD-10-CM | POA: Diagnosis not present

## 2018-09-09 DIAGNOSIS — N185 Chronic kidney disease, stage 5: Secondary | ICD-10-CM | POA: Diagnosis not present

## 2018-09-09 DIAGNOSIS — E118 Type 2 diabetes mellitus with unspecified complications: Secondary | ICD-10-CM | POA: Diagnosis not present

## 2018-10-01 ENCOUNTER — Encounter (HOSPITAL_COMMUNITY): Payer: Self-pay | Admitting: *Deleted

## 2018-10-02 ENCOUNTER — Ambulatory Visit (HOSPITAL_COMMUNITY)
Admission: RE | Admit: 2018-10-02 | Discharge: 2018-10-02 | Disposition: A | Discharge status: Home / Self Care | Payer: BLUE CROSS/BLUE SHIELD | Source: Ambulatory Visit | Attending: Nephrology | Admitting: Nephrology

## 2018-10-02 VITALS — BP 159/82 | HR 80 | Temp 98.8°F | Resp 20

## 2018-10-02 DIAGNOSIS — N183 Chronic kidney disease, stage 3 unspecified: Secondary | ICD-10-CM

## 2018-10-02 DIAGNOSIS — E1122 Type 2 diabetes mellitus with diabetic chronic kidney disease: Secondary | ICD-10-CM | POA: Diagnosis not present

## 2018-10-02 LAB — IRON AND TIBC
Iron: 69 ug/dL (ref 28–170)
Saturation Ratios: 28 % (ref 10.4–31.8)
TIBC: 245 ug/dL — ABNORMAL LOW (ref 250–450)
UIBC: 176 ug/dL

## 2018-10-02 LAB — POCT HEMOGLOBIN-HEMACUE: Hemoglobin: 8.5 g/dL — ABNORMAL LOW (ref 12.0–15.0)

## 2018-10-02 LAB — FERRITIN: Ferritin: 305 ng/mL (ref 11–307)

## 2018-10-02 MED ORDER — EPOETIN ALFA 20000 UNIT/ML IJ SOLN
INTRAMUSCULAR | Status: AC
  Filled 2018-10-02: qty 1

## 2018-10-02 MED ORDER — EPOETIN ALFA 20000 UNIT/ML IJ SOLN
20000.0000 [IU] | INTRAMUSCULAR | Status: DC
  Administered 2018-10-02: 20000 [IU] via SUBCUTANEOUS

## 2018-10-07 ENCOUNTER — Other Ambulatory Visit: Payer: Self-pay

## 2018-10-07 DIAGNOSIS — N185 Chronic kidney disease, stage 5: Secondary | ICD-10-CM

## 2018-10-09 ENCOUNTER — Encounter: Payer: Self-pay | Admitting: Family

## 2018-10-09 ENCOUNTER — Other Ambulatory Visit: Payer: Self-pay

## 2018-10-09 ENCOUNTER — Ambulatory Visit (HOSPITAL_COMMUNITY)
Admission: RE | Admit: 2018-10-09 | Discharge: 2018-10-09 | Disposition: A | Discharge status: Home / Self Care | Payer: BLUE CROSS/BLUE SHIELD | Source: Ambulatory Visit | Attending: Family | Admitting: Family

## 2018-10-09 ENCOUNTER — Ambulatory Visit (INDEPENDENT_AMBULATORY_CARE_PROVIDER_SITE_OTHER): Payer: Self-pay | Admitting: Physician Assistant

## 2018-10-09 VITALS — BP 159/93 | HR 80 | Temp 98.6°F | Resp 16 | Ht 62.0 in | Wt 184.0 lb

## 2018-10-09 DIAGNOSIS — N183 Chronic kidney disease, stage 3 unspecified: Secondary | ICD-10-CM

## 2018-10-09 DIAGNOSIS — N185 Chronic kidney disease, stage 5: Secondary | ICD-10-CM | POA: Insufficient documentation

## 2018-10-09 DIAGNOSIS — E1122 Type 2 diabetes mellitus with diabetic chronic kidney disease: Secondary | ICD-10-CM

## 2018-10-09 NOTE — Progress Notes (Signed)
    Postoperative Access Visit   History of Present Illness   Allison Durham is a 52 y.o. year old female who presents for postoperative follow-up for: right brachiocephalic arteriovenous fistula by Dr. Scot Dock (Date: 08/26/18).  The patient's wounds are well healed.  The patient denies steal symptoms.  The patient is able to complete their activities of daily living.  She is not yet on hemodialysis.   Physical Examination   Vitals:   10/09/18 1334  BP: (!) 159/93  Pulse: 80  Resp: 16  Temp: 98.6 F (37 C)  TempSrc: Oral  SpO2: 100%  Weight: 184 lb (83.5 kg)  Height: 5\' 2"  (1.575 m)   Body mass index is 33.65 kg/m.  right arm Incisions are healed, hand grip is 5/5, sensation in digits is intact, palpable thrill, bruit can be auscultated, palpable R radial pulse     Medical Decision Making   Allison Durham is a 53 y.o. year old female who presents s/p right brachiocephalic arteriovenous fistula   Patent fistula with palpable thrill and no sign or symptoms of steal syndrome  The fistula will be ready for use 11/18/18  The fistula runs deep in the upper half of her upper arm however I believe there is a large enough stick zone to avoid superficialization/translocation  She may follow up with [place] on a prn basis   Dagoberto Ligas PA-C [facility] [place] Office: [phone]

## 2018-10-29 DIAGNOSIS — D631 Anemia in chronic kidney disease: Secondary | ICD-10-CM | POA: Diagnosis not present

## 2018-10-29 DIAGNOSIS — N189 Chronic kidney disease, unspecified: Secondary | ICD-10-CM | POA: Diagnosis not present

## 2018-10-29 DIAGNOSIS — N2581 Secondary hyperparathyroidism of renal origin: Secondary | ICD-10-CM | POA: Diagnosis not present

## 2018-10-29 DIAGNOSIS — I10 Essential (primary) hypertension: Secondary | ICD-10-CM | POA: Diagnosis not present

## 2018-10-29 DIAGNOSIS — N185 Chronic kidney disease, stage 5: Secondary | ICD-10-CM | POA: Diagnosis not present

## 2018-10-30 ENCOUNTER — Ambulatory Visit (HOSPITAL_COMMUNITY)
Admission: RE | Admit: 2018-10-30 | Discharge: 2018-10-30 | Disposition: A | Discharge status: Home / Self Care | Payer: BLUE CROSS/BLUE SHIELD | Source: Ambulatory Visit | Attending: Nephrology | Admitting: Nephrology

## 2018-10-30 VITALS — BP 168/87 | HR 92 | Resp 18

## 2018-10-30 DIAGNOSIS — E1122 Type 2 diabetes mellitus with diabetic chronic kidney disease: Secondary | ICD-10-CM | POA: Diagnosis not present

## 2018-10-30 DIAGNOSIS — N183 Chronic kidney disease, stage 3 unspecified: Secondary | ICD-10-CM

## 2018-10-30 LAB — FERRITIN: Ferritin: 289 ng/mL (ref 11–307)

## 2018-10-30 LAB — IRON AND TIBC
Iron: 93 ug/dL (ref 28–170)
Saturation Ratios: 36 % — ABNORMAL HIGH (ref 10.4–31.8)
TIBC: 262 ug/dL (ref 250–450)
UIBC: 169 ug/dL

## 2018-10-30 LAB — POCT HEMOGLOBIN-HEMACUE: HEMOGLOBIN: 10 g/dL — AB (ref 12.0–15.0)

## 2018-10-30 MED ORDER — EPOETIN ALFA 20000 UNIT/ML IJ SOLN
INTRAMUSCULAR | Status: AC
  Filled 2018-10-30: qty 1

## 2018-10-30 MED ORDER — EPOETIN ALFA 20000 UNIT/ML IJ SOLN
20000.0000 [IU] | INTRAMUSCULAR | Status: DC
  Administered 2018-10-30: 20000 [IU] via SUBCUTANEOUS

## 2018-11-11 DIAGNOSIS — E119 Type 2 diabetes mellitus without complications: Secondary | ICD-10-CM | POA: Diagnosis not present

## 2018-11-19 DIAGNOSIS — D649 Anemia, unspecified: Secondary | ICD-10-CM | POA: Diagnosis not present

## 2018-11-19 DIAGNOSIS — R809 Proteinuria, unspecified: Secondary | ICD-10-CM | POA: Diagnosis not present

## 2018-11-19 DIAGNOSIS — I10 Essential (primary) hypertension: Secondary | ICD-10-CM | POA: Diagnosis not present

## 2018-11-19 DIAGNOSIS — N189 Chronic kidney disease, unspecified: Secondary | ICD-10-CM | POA: Diagnosis not present

## 2018-11-22 DIAGNOSIS — E0590 Thyrotoxicosis, unspecified without thyrotoxic crisis or storm: Secondary | ICD-10-CM | POA: Diagnosis not present

## 2018-11-22 DIAGNOSIS — D649 Anemia, unspecified: Secondary | ICD-10-CM | POA: Diagnosis not present

## 2018-11-22 DIAGNOSIS — E1165 Type 2 diabetes mellitus with hyperglycemia: Secondary | ICD-10-CM | POA: Diagnosis not present

## 2018-11-27 ENCOUNTER — Ambulatory Visit (HOSPITAL_COMMUNITY)
Admission: RE | Admit: 2018-11-27 | Discharge: 2018-11-27 | Disposition: A | Discharge status: Home / Self Care | Payer: BLUE CROSS/BLUE SHIELD | Source: Ambulatory Visit | Attending: Nephrology | Admitting: Nephrology

## 2018-11-27 ENCOUNTER — Other Ambulatory Visit: Payer: Self-pay

## 2018-11-27 ENCOUNTER — Encounter (HOSPITAL_COMMUNITY): Payer: BLUE CROSS/BLUE SHIELD

## 2018-11-27 VITALS — BP 151/84 | HR 75 | Temp 98.9°F | Resp 18

## 2018-11-27 DIAGNOSIS — N183 Chronic kidney disease, stage 3 unspecified: Secondary | ICD-10-CM

## 2018-11-27 DIAGNOSIS — E1122 Type 2 diabetes mellitus with diabetic chronic kidney disease: Secondary | ICD-10-CM | POA: Insufficient documentation

## 2018-11-27 LAB — IRON AND TIBC
Iron: 82 ug/dL (ref 28–170)
Saturation Ratios: 32 % — ABNORMAL HIGH (ref 10.4–31.8)
TIBC: 253 ug/dL (ref 250–450)
UIBC: 171 ug/dL

## 2018-11-27 LAB — POCT HEMOGLOBIN-HEMACUE: Hemoglobin: 10.5 g/dL — ABNORMAL LOW (ref 12.0–15.0)

## 2018-11-27 LAB — FERRITIN: Ferritin: 253 ng/mL (ref 11–307)

## 2018-11-27 MED ORDER — EPOETIN ALFA 20000 UNIT/ML IJ SOLN
INTRAMUSCULAR | Status: AC
  Filled 2018-11-27: qty 1

## 2018-11-27 MED ORDER — EPOETIN ALFA 20000 UNIT/ML IJ SOLN
20000.0000 [IU] | INTRAMUSCULAR | Status: DC
  Administered 2018-11-27: 20000 [IU] via SUBCUTANEOUS

## 2018-12-09 DIAGNOSIS — N185 Chronic kidney disease, stage 5: Secondary | ICD-10-CM | POA: Diagnosis not present

## 2018-12-25 ENCOUNTER — Other Ambulatory Visit: Payer: Self-pay

## 2018-12-25 ENCOUNTER — Ambulatory Visit (HOSPITAL_COMMUNITY)
Admission: RE | Admit: 2018-12-25 | Discharge: 2018-12-25 | Disposition: A | Discharge status: Home / Self Care | Payer: BLUE CROSS/BLUE SHIELD | Source: Ambulatory Visit | Attending: Nephrology | Admitting: Nephrology

## 2018-12-25 VITALS — BP 182/89 | HR 89 | Temp 97.3°F | Resp 18

## 2018-12-25 DIAGNOSIS — E1122 Type 2 diabetes mellitus with diabetic chronic kidney disease: Secondary | ICD-10-CM | POA: Insufficient documentation

## 2018-12-25 DIAGNOSIS — N183 Chronic kidney disease, stage 3 unspecified: Secondary | ICD-10-CM

## 2018-12-25 LAB — IRON AND TIBC
Iron: 87 ug/dL (ref 28–170)
Saturation Ratios: 34 % — ABNORMAL HIGH (ref 10.4–31.8)
TIBC: 253 ug/dL (ref 250–450)
UIBC: 166 ug/dL

## 2018-12-25 LAB — RENAL FUNCTION PANEL
Albumin: 3.9 g/dL (ref 3.5–5.0)
Anion gap: 13 (ref 5–15)
BUN: 75 mg/dL — ABNORMAL HIGH (ref 6–20)
CO2: 24 mmol/L (ref 22–32)
Calcium: 9.9 mg/dL (ref 8.9–10.3)
Chloride: 103 mmol/L (ref 98–111)
Creatinine, Ser: 6.69 mg/dL — ABNORMAL HIGH (ref 0.44–1.00)
GFR calc Af Amer: 7 mL/min — ABNORMAL LOW (ref >60)
GFR calc non Af Amer: 6 mL/min — ABNORMAL LOW (ref >60)
Glucose, Bld: 181 mg/dL — ABNORMAL HIGH (ref 70–99)
Phosphorus: 5.8 mg/dL — ABNORMAL HIGH (ref 2.5–4.6)
Potassium: 4.4 mmol/L (ref 3.5–5.1)
Sodium: 140 mmol/L (ref 135–145)

## 2018-12-25 LAB — FERRITIN: Ferritin: 283 ng/mL (ref 11–307)

## 2018-12-25 MED ORDER — EPOETIN ALFA 20000 UNIT/ML IJ SOLN
INTRAMUSCULAR | Status: AC
  Filled 2018-12-25: qty 1

## 2018-12-25 MED ORDER — EPOETIN ALFA 20000 UNIT/ML IJ SOLN
20000.0000 [IU] | INTRAMUSCULAR | Status: DC
  Administered 2018-12-25: 20000 [IU] via SUBCUTANEOUS

## 2018-12-26 LAB — POCT HEMOGLOBIN-HEMACUE: Hemoglobin: 10.3 g/dL — ABNORMAL LOW (ref 12.0–15.0)

## 2019-01-22 ENCOUNTER — Encounter (HOSPITAL_COMMUNITY): Payer: BLUE CROSS/BLUE SHIELD

## 2019-01-28 DIAGNOSIS — Z01818 Encounter for other preprocedural examination: Secondary | ICD-10-CM | POA: Diagnosis not present

## 2019-02-06 DIAGNOSIS — Z1231 Encounter for screening mammogram for malignant neoplasm of breast: Secondary | ICD-10-CM | POA: Diagnosis not present

## 2019-02-06 DIAGNOSIS — Z01419 Encounter for gynecological examination (general) (routine) without abnormal findings: Secondary | ICD-10-CM | POA: Diagnosis not present

## 2019-02-06 DIAGNOSIS — Z6831 Body mass index (BMI) 31.0-31.9, adult: Secondary | ICD-10-CM | POA: Diagnosis not present

## 2019-02-13 ENCOUNTER — Inpatient Hospital Stay (HOSPITAL_COMMUNITY): Admission: RE | Admit: 2019-02-13 | Payer: BLUE CROSS/BLUE SHIELD | Source: Ambulatory Visit

## 2019-02-19 ENCOUNTER — Encounter (HOSPITAL_COMMUNITY): Payer: BLUE CROSS/BLUE SHIELD

## 2019-03-06 DIAGNOSIS — I120 Hypertensive chronic kidney disease with stage 5 chronic kidney disease or end stage renal disease: Secondary | ICD-10-CM | POA: Diagnosis not present

## 2019-03-06 DIAGNOSIS — E1122 Type 2 diabetes mellitus with diabetic chronic kidney disease: Secondary | ICD-10-CM | POA: Diagnosis not present

## 2019-03-06 DIAGNOSIS — Z95828 Presence of other vascular implants and grafts: Secondary | ICD-10-CM | POA: Diagnosis not present

## 2019-03-06 DIAGNOSIS — I6032 Nontraumatic subarachnoid hemorrhage from left posterior communicating artery: Secondary | ICD-10-CM | POA: Diagnosis not present

## 2019-03-07 ENCOUNTER — Ambulatory Visit (HOSPITAL_COMMUNITY)
Admission: RE | Admit: 2019-03-07 | Discharge: 2019-03-07 | Disposition: A | Discharge status: Home / Self Care | Payer: BC Managed Care – PPO | Source: Ambulatory Visit | Attending: Nephrology | Admitting: Nephrology

## 2019-03-07 ENCOUNTER — Other Ambulatory Visit: Payer: Self-pay

## 2019-03-07 VITALS — BP 138/75 | HR 78

## 2019-03-07 DIAGNOSIS — E1122 Type 2 diabetes mellitus with diabetic chronic kidney disease: Secondary | ICD-10-CM | POA: Insufficient documentation

## 2019-03-07 DIAGNOSIS — N183 Chronic kidney disease, stage 3 unspecified: Secondary | ICD-10-CM

## 2019-03-07 LAB — FERRITIN: Ferritin: 304 ng/mL (ref 11–307)

## 2019-03-07 LAB — POCT HEMOGLOBIN-HEMACUE: Hemoglobin: 8.8 g/dL — ABNORMAL LOW (ref 12.0–15.0)

## 2019-03-07 LAB — IRON AND TIBC
Iron: 74 ug/dL (ref 28–170)
Saturation Ratios: 29 % (ref 10.4–31.8)
TIBC: 252 ug/dL (ref 250–450)
UIBC: 178 ug/dL

## 2019-03-07 MED ORDER — EPOETIN ALFA 20000 UNIT/ML IJ SOLN
20000.0000 [IU] | INTRAMUSCULAR | Status: DC
  Administered 2019-03-07: 20000 [IU] via SUBCUTANEOUS

## 2019-03-07 MED ORDER — EPOETIN ALFA 20000 UNIT/ML IJ SOLN
INTRAMUSCULAR | Status: AC
  Filled 2019-03-07: qty 1

## 2019-03-13 ENCOUNTER — Encounter (HOSPITAL_COMMUNITY): Payer: BC Managed Care – PPO

## 2019-03-28 ENCOUNTER — Other Ambulatory Visit: Payer: Self-pay | Admitting: Internal Medicine

## 2019-03-28 DIAGNOSIS — Z20822 Contact with and (suspected) exposure to covid-19: Secondary | ICD-10-CM

## 2019-04-02 LAB — NOVEL CORONAVIRUS, NAA: SARS-CoV-2, NAA: NOT DETECTED

## 2019-04-04 ENCOUNTER — Encounter (HOSPITAL_COMMUNITY): Payer: BC Managed Care – PPO

## 2019-04-04 ENCOUNTER — Encounter (HOSPITAL_COMMUNITY): Payer: Self-pay

## 2019-04-18 ENCOUNTER — Encounter (HOSPITAL_COMMUNITY): Payer: BC Managed Care – PPO

## 2019-04-28 DIAGNOSIS — D649 Anemia, unspecified: Secondary | ICD-10-CM | POA: Diagnosis not present

## 2019-04-28 DIAGNOSIS — N185 Chronic kidney disease, stage 5: Secondary | ICD-10-CM | POA: Diagnosis not present

## 2019-04-28 DIAGNOSIS — E1121 Type 2 diabetes mellitus with diabetic nephropathy: Secondary | ICD-10-CM | POA: Diagnosis not present

## 2019-04-28 DIAGNOSIS — I10 Essential (primary) hypertension: Secondary | ICD-10-CM | POA: Diagnosis not present

## 2019-05-01 DIAGNOSIS — Z713 Dietary counseling and surveillance: Secondary | ICD-10-CM | POA: Diagnosis not present

## 2019-05-01 DIAGNOSIS — Z6831 Body mass index (BMI) 31.0-31.9, adult: Secondary | ICD-10-CM | POA: Diagnosis not present

## 2019-05-01 DIAGNOSIS — N186 End stage renal disease: Secondary | ICD-10-CM | POA: Diagnosis not present

## 2019-05-13 DIAGNOSIS — N189 Chronic kidney disease, unspecified: Secondary | ICD-10-CM | POA: Diagnosis not present

## 2019-05-13 DIAGNOSIS — N185 Chronic kidney disease, stage 5: Secondary | ICD-10-CM | POA: Diagnosis not present

## 2019-05-13 DIAGNOSIS — I10 Essential (primary) hypertension: Secondary | ICD-10-CM | POA: Diagnosis not present

## 2019-05-13 DIAGNOSIS — N2581 Secondary hyperparathyroidism of renal origin: Secondary | ICD-10-CM | POA: Diagnosis not present

## 2019-05-13 DIAGNOSIS — D631 Anemia in chronic kidney disease: Secondary | ICD-10-CM | POA: Diagnosis not present

## 2019-05-27 DIAGNOSIS — D649 Anemia, unspecified: Secondary | ICD-10-CM | POA: Diagnosis not present

## 2019-05-27 DIAGNOSIS — E1165 Type 2 diabetes mellitus with hyperglycemia: Secondary | ICD-10-CM | POA: Diagnosis not present

## 2019-05-27 DIAGNOSIS — N189 Chronic kidney disease, unspecified: Secondary | ICD-10-CM | POA: Diagnosis not present

## 2019-05-27 DIAGNOSIS — I10 Essential (primary) hypertension: Secondary | ICD-10-CM | POA: Diagnosis not present

## 2019-06-04 DIAGNOSIS — D649 Anemia, unspecified: Secondary | ICD-10-CM | POA: Diagnosis not present

## 2019-06-04 DIAGNOSIS — I671 Cerebral aneurysm, nonruptured: Secondary | ICD-10-CM | POA: Diagnosis not present

## 2019-06-04 DIAGNOSIS — E1121 Type 2 diabetes mellitus with diabetic nephropathy: Secondary | ICD-10-CM | POA: Diagnosis not present

## 2019-06-04 DIAGNOSIS — K219 Gastro-esophageal reflux disease without esophagitis: Secondary | ICD-10-CM | POA: Diagnosis not present

## 2019-06-11 DIAGNOSIS — I10 Essential (primary) hypertension: Secondary | ICD-10-CM | POA: Diagnosis not present

## 2019-06-11 DIAGNOSIS — J309 Allergic rhinitis, unspecified: Secondary | ICD-10-CM | POA: Diagnosis not present

## 2019-06-13 DIAGNOSIS — I10 Essential (primary) hypertension: Secondary | ICD-10-CM | POA: Diagnosis not present

## 2019-06-13 DIAGNOSIS — N185 Chronic kidney disease, stage 5: Secondary | ICD-10-CM | POA: Diagnosis not present

## 2019-06-16 ENCOUNTER — Encounter (HOSPITAL_COMMUNITY)
Admission: RE | Admit: 2019-06-16 | Discharge: 2019-06-16 | Disposition: A | Discharge status: Home / Self Care | Payer: BC Managed Care – PPO | Source: Ambulatory Visit | Attending: Nephrology | Admitting: Nephrology

## 2019-06-16 VITALS — BP 159/79 | HR 84 | Temp 97.1°F | Resp 18

## 2019-06-16 DIAGNOSIS — E1122 Type 2 diabetes mellitus with diabetic chronic kidney disease: Secondary | ICD-10-CM | POA: Diagnosis not present

## 2019-06-16 DIAGNOSIS — N183 Chronic kidney disease, stage 3 unspecified: Secondary | ICD-10-CM | POA: Diagnosis not present

## 2019-06-16 LAB — POCT HEMOGLOBIN-HEMACUE: Hemoglobin: 8.9 g/dL — ABNORMAL LOW (ref 12.0–15.0)

## 2019-06-16 LAB — IRON AND TIBC
Iron: 94 ug/dL (ref 28–170)
Saturation Ratios: 39 % — ABNORMAL HIGH (ref 10.4–31.8)
TIBC: 242 ug/dL — ABNORMAL LOW (ref 250–450)
UIBC: 148 ug/dL

## 2019-06-16 LAB — FERRITIN: Ferritin: 313 ng/mL — ABNORMAL HIGH (ref 11–307)

## 2019-06-16 MED ORDER — EPOETIN ALFA 20000 UNIT/ML IJ SOLN
INTRAMUSCULAR | Status: AC
  Filled 2019-06-16: qty 1

## 2019-06-16 MED ORDER — EPOETIN ALFA 20000 UNIT/ML IJ SOLN
20000.0000 [IU] | INTRAMUSCULAR | Status: DC
  Administered 2019-06-16: 20000 [IU] via SUBCUTANEOUS

## 2019-06-30 ENCOUNTER — Other Ambulatory Visit: Payer: Self-pay

## 2019-06-30 ENCOUNTER — Ambulatory Visit (HOSPITAL_COMMUNITY)
Admission: RE | Admit: 2019-06-30 | Discharge: 2019-06-30 | Disposition: A | Discharge status: Home / Self Care | Payer: BC Managed Care – PPO | Source: Ambulatory Visit | Attending: Nephrology | Admitting: Nephrology

## 2019-06-30 VITALS — BP 174/86 | HR 77 | Temp 96.6°F | Resp 18

## 2019-06-30 DIAGNOSIS — E1122 Type 2 diabetes mellitus with diabetic chronic kidney disease: Secondary | ICD-10-CM | POA: Insufficient documentation

## 2019-06-30 DIAGNOSIS — I10 Essential (primary) hypertension: Secondary | ICD-10-CM | POA: Diagnosis not present

## 2019-06-30 DIAGNOSIS — N183 Chronic kidney disease, stage 3 unspecified: Secondary | ICD-10-CM

## 2019-06-30 DIAGNOSIS — N189 Chronic kidney disease, unspecified: Secondary | ICD-10-CM | POA: Diagnosis not present

## 2019-06-30 DIAGNOSIS — D631 Anemia in chronic kidney disease: Secondary | ICD-10-CM | POA: Diagnosis not present

## 2019-06-30 DIAGNOSIS — N2581 Secondary hyperparathyroidism of renal origin: Secondary | ICD-10-CM | POA: Diagnosis not present

## 2019-06-30 DIAGNOSIS — N185 Chronic kidney disease, stage 5: Secondary | ICD-10-CM | POA: Diagnosis not present

## 2019-06-30 LAB — POCT HEMOGLOBIN-HEMACUE: Hemoglobin: 9.8 g/dL — ABNORMAL LOW (ref 12.0–15.0)

## 2019-06-30 MED ORDER — EPOETIN ALFA 20000 UNIT/ML IJ SOLN
20000.0000 [IU] | INTRAMUSCULAR | Status: DC
  Administered 2019-06-30: 20000 [IU] via SUBCUTANEOUS

## 2019-06-30 MED ORDER — EPOETIN ALFA 20000 UNIT/ML IJ SOLN
INTRAMUSCULAR | Status: AC
  Filled 2019-06-30: qty 1

## 2019-07-10 DIAGNOSIS — Z1159 Encounter for screening for other viral diseases: Secondary | ICD-10-CM | POA: Diagnosis not present

## 2019-07-14 ENCOUNTER — Encounter (HOSPITAL_COMMUNITY)
Admission: RE | Admit: 2019-07-14 | Discharge: 2019-07-14 | Disposition: A | Discharge status: Home / Self Care | Payer: BC Managed Care – PPO | Source: Ambulatory Visit | Attending: Nephrology | Admitting: Nephrology

## 2019-07-14 ENCOUNTER — Other Ambulatory Visit: Payer: Self-pay

## 2019-07-14 VITALS — BP 173/98 | HR 82 | Temp 94.9°F | Resp 18

## 2019-07-14 DIAGNOSIS — N183 Chronic kidney disease, stage 3 unspecified: Secondary | ICD-10-CM

## 2019-07-14 DIAGNOSIS — E1122 Type 2 diabetes mellitus with diabetic chronic kidney disease: Secondary | ICD-10-CM | POA: Insufficient documentation

## 2019-07-14 LAB — FERRITIN: Ferritin: 261 ng/mL (ref 11–307)

## 2019-07-14 LAB — IRON AND TIBC
Iron: 66 ug/dL (ref 28–170)
Saturation Ratios: 27 % (ref 10.4–31.8)
TIBC: 246 ug/dL — ABNORMAL LOW (ref 250–450)
UIBC: 180 ug/dL

## 2019-07-14 LAB — POCT HEMOGLOBIN-HEMACUE: Hemoglobin: 10.6 g/dL — ABNORMAL LOW (ref 12.0–15.0)

## 2019-07-14 MED ORDER — EPOETIN ALFA 20000 UNIT/ML IJ SOLN
INTRAMUSCULAR | Status: AC
  Administered 2019-07-14: 20000 [IU] via SUBCUTANEOUS
  Filled 2019-07-14: qty 1

## 2019-07-14 MED ORDER — EPOETIN ALFA 20000 UNIT/ML IJ SOLN
20000.0000 [IU] | INTRAMUSCULAR | Status: DC
  Administered 2019-07-14: 09:00:00 20000 [IU] via SUBCUTANEOUS

## 2019-07-15 DIAGNOSIS — K640 First degree hemorrhoids: Secondary | ICD-10-CM | POA: Diagnosis not present

## 2019-07-15 DIAGNOSIS — D509 Iron deficiency anemia, unspecified: Secondary | ICD-10-CM | POA: Diagnosis not present

## 2019-07-15 DIAGNOSIS — K317 Polyp of stomach and duodenum: Secondary | ICD-10-CM | POA: Diagnosis not present

## 2019-07-28 ENCOUNTER — Encounter (HOSPITAL_COMMUNITY): Payer: BC Managed Care – PPO

## 2019-07-31 ENCOUNTER — Encounter (HOSPITAL_COMMUNITY): Payer: BC Managed Care – PPO

## 2019-08-05 DIAGNOSIS — D631 Anemia in chronic kidney disease: Secondary | ICD-10-CM | POA: Diagnosis not present

## 2019-08-05 DIAGNOSIS — Z23 Encounter for immunization: Secondary | ICD-10-CM | POA: Diagnosis not present

## 2019-08-05 DIAGNOSIS — I10 Essential (primary) hypertension: Secondary | ICD-10-CM | POA: Diagnosis not present

## 2019-08-05 DIAGNOSIS — N189 Chronic kidney disease, unspecified: Secondary | ICD-10-CM | POA: Diagnosis not present

## 2019-08-05 DIAGNOSIS — N185 Chronic kidney disease, stage 5: Secondary | ICD-10-CM | POA: Diagnosis not present

## 2019-08-05 DIAGNOSIS — N2581 Secondary hyperparathyroidism of renal origin: Secondary | ICD-10-CM | POA: Diagnosis not present

## 2019-08-11 ENCOUNTER — Encounter (HOSPITAL_COMMUNITY): Payer: BC Managed Care – PPO

## 2019-08-14 ENCOUNTER — Encounter (HOSPITAL_COMMUNITY): Payer: BC Managed Care – PPO

## 2019-08-29 ENCOUNTER — Ambulatory Visit: Payer: BC Managed Care – PPO | Attending: Internal Medicine

## 2019-08-29 DIAGNOSIS — Z20828 Contact with and (suspected) exposure to other viral communicable diseases: Secondary | ICD-10-CM | POA: Diagnosis not present

## 2019-08-29 DIAGNOSIS — Z20822 Contact with and (suspected) exposure to covid-19: Secondary | ICD-10-CM

## 2019-08-30 LAB — NOVEL CORONAVIRUS, NAA: SARS-CoV-2, NAA: NOT DETECTED

## 2019-09-02 DIAGNOSIS — K5900 Constipation, unspecified: Secondary | ICD-10-CM | POA: Diagnosis not present

## 2019-09-02 DIAGNOSIS — K317 Polyp of stomach and duodenum: Secondary | ICD-10-CM | POA: Diagnosis not present

## 2019-09-15 DIAGNOSIS — N189 Chronic kidney disease, unspecified: Secondary | ICD-10-CM | POA: Diagnosis not present

## 2019-09-15 DIAGNOSIS — I10 Essential (primary) hypertension: Secondary | ICD-10-CM | POA: Diagnosis not present

## 2019-09-15 DIAGNOSIS — N2581 Secondary hyperparathyroidism of renal origin: Secondary | ICD-10-CM | POA: Diagnosis not present

## 2019-09-15 DIAGNOSIS — D631 Anemia in chronic kidney disease: Secondary | ICD-10-CM | POA: Diagnosis not present

## 2019-09-15 DIAGNOSIS — N185 Chronic kidney disease, stage 5: Secondary | ICD-10-CM | POA: Diagnosis not present

## 2019-09-23 ENCOUNTER — Other Ambulatory Visit: Payer: Self-pay | Admitting: Gastroenterology

## 2019-09-24 NOTE — Progress Notes (Signed)
   Pre-op endo call completed.   Advised to hold prn ASA prior to procedure No DM meds day of procedure

## 2019-09-25 ENCOUNTER — Other Ambulatory Visit (HOSPITAL_COMMUNITY)
Admission: RE | Admit: 2019-09-25 | Discharge: 2019-09-25 | Disposition: A | Discharge status: Home / Self Care | Payer: BC Managed Care – PPO | Source: Ambulatory Visit | Attending: Gastroenterology | Admitting: Gastroenterology

## 2019-09-25 DIAGNOSIS — Z20822 Contact with and (suspected) exposure to covid-19: Secondary | ICD-10-CM | POA: Diagnosis not present

## 2019-09-25 DIAGNOSIS — Z01812 Encounter for preprocedural laboratory examination: Secondary | ICD-10-CM | POA: Insufficient documentation

## 2019-09-26 ENCOUNTER — Other Ambulatory Visit: Payer: Self-pay | Admitting: Gastroenterology

## 2019-09-26 LAB — NOVEL CORONAVIRUS, NAA (HOSP ORDER, SEND-OUT TO REF LAB; TAT 18-24 HRS): SARS-CoV-2, NAA: NOT DETECTED

## 2019-09-26 NOTE — Progress Notes (Signed)
Attempted preop call. Left voicemessage

## 2019-09-29 ENCOUNTER — Ambulatory Visit (HOSPITAL_COMMUNITY)
Admission: RE | Admit: 2019-09-29 | Discharge: 2019-09-29 | Disposition: A | Discharge status: Home / Self Care | Payer: BC Managed Care – PPO | Attending: Gastroenterology | Admitting: Gastroenterology

## 2019-09-29 ENCOUNTER — Other Ambulatory Visit: Payer: Self-pay

## 2019-09-29 ENCOUNTER — Ambulatory Visit (HOSPITAL_COMMUNITY): Payer: BC Managed Care – PPO | Admitting: Anesthesiology

## 2019-09-29 ENCOUNTER — Encounter (HOSPITAL_COMMUNITY): Payer: Self-pay | Admitting: Gastroenterology

## 2019-09-29 ENCOUNTER — Encounter (HOSPITAL_COMMUNITY)
Admission: RE | Disposition: A | Discharge status: Home / Self Care | Payer: Self-pay | Source: Home / Self Care | Attending: Gastroenterology

## 2019-09-29 DIAGNOSIS — K2970 Gastritis, unspecified, without bleeding: Secondary | ICD-10-CM | POA: Diagnosis not present

## 2019-09-29 DIAGNOSIS — I129 Hypertensive chronic kidney disease with stage 1 through stage 4 chronic kidney disease, or unspecified chronic kidney disease: Secondary | ICD-10-CM | POA: Diagnosis not present

## 2019-09-29 DIAGNOSIS — N189 Chronic kidney disease, unspecified: Secondary | ICD-10-CM | POA: Diagnosis not present

## 2019-09-29 DIAGNOSIS — K317 Polyp of stomach and duodenum: Secondary | ICD-10-CM | POA: Diagnosis not present

## 2019-09-29 DIAGNOSIS — K219 Gastro-esophageal reflux disease without esophagitis: Secondary | ICD-10-CM | POA: Diagnosis not present

## 2019-09-29 DIAGNOSIS — E1022 Type 1 diabetes mellitus with diabetic chronic kidney disease: Secondary | ICD-10-CM | POA: Insufficient documentation

## 2019-09-29 DIAGNOSIS — A419 Sepsis, unspecified organism: Secondary | ICD-10-CM | POA: Diagnosis not present

## 2019-09-29 DIAGNOSIS — K313 Pylorospasm, not elsewhere classified: Secondary | ICD-10-CM | POA: Insufficient documentation

## 2019-09-29 HISTORY — PX: BIOPSY: SHX5522

## 2019-09-29 HISTORY — PX: ESOPHAGOGASTRODUODENOSCOPY (EGD) WITH PROPOFOL: SHX5813

## 2019-09-29 HISTORY — PX: HEMOSTASIS CLIP PLACEMENT: SHX6857

## 2019-09-29 HISTORY — PX: HOT HEMOSTASIS: SHX5433

## 2019-09-29 LAB — GLUCOSE, CAPILLARY: Glucose-Capillary: 98 mg/dL (ref 70–99)

## 2019-09-29 SURGERY — ESOPHAGOGASTRODUODENOSCOPY (EGD) WITH PROPOFOL
Anesthesia: Monitor Anesthesia Care

## 2019-09-29 MED ORDER — PANTOPRAZOLE SODIUM 40 MG PO TBEC: 40.0000 mg | DELAYED_RELEASE_TABLET | Freq: Two times a day (BID) | ORAL | 1 refills | Status: AC

## 2019-09-29 MED ORDER — PROPOFOL 10 MG/ML IV BOLUS
INTRAVENOUS | Status: DC | PRN
  Administered 2019-09-29: 50 mg via INTRAVENOUS
  Administered 2019-09-29: 20 mg via INTRAVENOUS
  Administered 2019-09-29: 40 mg via INTRAVENOUS
  Administered 2019-09-29 (×6): 20 mg via INTRAVENOUS

## 2019-09-29 MED ORDER — LACTATED RINGERS IV SOLN
INTRAVENOUS | Status: DC
  Administered 2019-09-29: 1000 mL via INTRAVENOUS

## 2019-09-29 MED ORDER — PROPOFOL 500 MG/50ML IV EMUL
INTRAVENOUS | Status: AC
  Filled 2019-09-29: qty 50

## 2019-09-29 MED ORDER — PROPOFOL 500 MG/50ML IV EMUL
INTRAVENOUS | Status: DC | PRN
  Administered 2019-09-29: 110 ug/kg/min via INTRAVENOUS

## 2019-09-29 MED ORDER — PROPOFOL 10 MG/ML IV BOLUS
INTRAVENOUS | Status: AC
  Filled 2019-09-29: qty 20

## 2019-09-29 MED ORDER — SODIUM CHLORIDE 0.9 % IV SOLN: INTRAVENOUS | Status: DC

## 2019-09-29 SURGICAL SUPPLY — 15 items

## 2019-09-29 NOTE — H&P (Signed)
Shahadah Pelowski is a 54 y.o. female has presented today for EGD for possible polypectomy of large hyperplastic gastric polyp. The various methods of treatment have been discussed with the patient and family. After consideration of risks, benefits and other options for treatment, the patient has consented to  Procedure(s): EGD  as a surgical intervention .  The patient's history has been reviewed, patient examined, no change in status, stable for surgery.  I have reviewed the patient's chart and labs.  Questions were answered to the patient's satisfaction.   Risks (bleeding, infection, bowel perforation that could require surgery, sedation-related changes in cardiopulmonary systems), benefits (identification and possible treatment of source of symptoms, exclusion of certain causes of symptoms), and alternatives (watchful waiting, radiographic imaging studies, empiric medical treatment)  were explained to patient/family in detail and patient wishes to proceed.  Otis Brace MD, [place] 09/29/2019, 10:07 AM  Contact #  [phone]

## 2019-09-29 NOTE — Anesthesia Preprocedure Evaluation (Addendum)
Anesthesia Evaluation  Patient identified by MRN, date of birth, ID band Patient awake    Reviewed: Allergy & Precautions, NPO status , Patient's Chart, lab work & pertinent test results  Airway Mallampati: II  TM Distance: >3 FB Neck ROM: Full    Dental no notable dental hx. (+) Edentulous Upper   Pulmonary neg pulmonary ROS,    Pulmonary exam normal breath sounds clear to auscultation       Cardiovascular hypertension, Pt. on medications and Pt. on home beta blockers Normal cardiovascular exam Rhythm:Regular Rate:Normal     Neuro/Psych negative psych ROS   GI/Hepatic   Endo/Other  diabetes, Type 1  Renal/GU CRFRenal disease     Musculoskeletal   Abdominal   Peds  Hematology  (+) anemia ,   Anesthesia Other Findings   Reproductive/Obstetrics                            Anesthesia Physical Anesthesia Plan  ASA: III  Anesthesia Plan: MAC   Post-op Pain Management:    Induction: Intravenous  PONV Risk Score and Plan: Treatment may vary due to age or medical condition  Airway Management Planned: Nasal Cannula and Natural Airway  Additional Equipment:   Intra-op Plan:   Post-operative Plan:   Informed Consent: I have reviewed the patients History and Physical, chart, labs and discussed the procedure including the risks, benefits and alternatives for the proposed anesthesia with the patient or authorized representative who has indicated his/her understanding and acceptance.     Dental advisory given  Plan Discussed with: CRNA  Anesthesia Plan Comments:        Anesthesia Quick Evaluation

## 2019-09-29 NOTE — Op Note (Addendum)
[facility] Patient Name: Allison Durham Procedure Date: 09/29/2019 MRN: [id number] Attending MD: Otis Brace , MD Date of Birth: Sep 30, 1965 CSN: [id number] Age: 54 Admit Type: Outpatient Procedure:                Upper GI endoscopy Indications:              For therapy of gastric polyps Providers:                Otis Brace, MD, Grace Isaac, RN, William Dalton, Technician, Theodora Blow, Technician Referring MD:              Medicines:                Sedation Administered by an Anesthesia Professional Complications:            No immediate complications. Estimated Blood Loss:     Estimated blood loss was minimal. Procedure:                Pre-Anesthesia Assessment:                           - Prior to the procedure, a History and Physical                            was performed, and patient medications and                            allergies were reviewed. The patient's tolerance of                            previous anesthesia was also reviewed. The risks                            and benefits of the procedure and the sedation                            options and risks were discussed with the patient.                            All questions were answered, and informed consent                            was obtained. Prior Anticoagulants: The patient has                            taken no previous anticoagulant or antiplatelet                            agents. ASA Grade Assessment: III - A patient with                            severe systemic disease. After reviewing the risks  and benefits, the patient was deemed in                            satisfactory condition to undergo the procedure.                           After obtaining informed consent, the endoscope was                            passed under direct vision. Throughout the                            procedure, the patient's blood  pressure, pulse, and                            oxygen saturations were monitored continuously. The                            [id number]-[id number]) Olympus gastroscope was                            introduced through the mouth, and advanced to the                            second part of duodenum. The upper GI endoscopy was                            technically difficult and complex due to                            significant spasms at the pre pyloric area. The                            patient tolerated the procedure well. Scope In: Scope Out: Findings:      The Z-line was regular and was found 39 cm from the incisors.      The exam of the esophagus was otherwise normal.      Scattered mild inflammation was found in the entire examined stomach.      Multi lobulated 15 to 20 mm sessile polyps with no bleeding and no       stigmata of recent bleeding were found in the prepyloric region of the       stomach. The polyp was removed with a piecemeal technique using a hot       snare. Polyp resection was incomplete. The resected tissue was       retrieved. To close a defect after polypectomy, two hemostatic clips       were successfully placed. There was no bleeding at the end of the       procedure. Polypectomy was technically difficult because of angulation       at prepyloric area along with significant contraction and spasms. Polyp       was multilobulated and sessile.      The cardia and gastric fundus were normal on retroflexion.      The duodenal bulb, first portion of the duodenum and second portion  of       the duodenum were normal. Impression:               - Z-line regular, 39 cm from the incisors.                           - Gastritis.                           - Multi lobulated gastric polyps. Incomplete                            resection. Resected tissue retrieved. Clips were                            placed.                           - Normal duodenal bulb, first  portion of the                            duodenum and second portion of the duodenum. Moderate Sedation:      Moderate (conscious) sedation was personally administered by an       anesthesia professional. The following parameters were monitored: oxygen       saturation, heart rate, blood pressure, and response to care. Recommendation:           - Patient has a contact number available for                            emergencies. The signs and symptoms of potential                            delayed complications were discussed with the                            patient. Return to normal activities tomorrow.                            Written discharge instructions were provided to the                            patient.                           - Resume previous diet.                           - Continue present medications.                           - Await pathology results.                           - Repeat upper endoscopy after studies are complete  for surveillance based on pathology results.                           - Use Protonix (pantoprazole) 40 mg PO BID for 4                            weeks.                           - No ibuprofen, naproxen, or other non-steroidal                            anti-inflammatory drugs for 2 weeks after polyp                            removal. Procedure Code(s):        --- Professional ---                           [phone], Esophagogastroduodenoscopy, flexible,                            transoral; with removal of tumor(s), polyp(s), or                            other lesion(s) by snare technique Diagnosis Code(s):        --- Professional ---                           K29.70, Gastritis, unspecified, without bleeding                           K31.7, Polyp of stomach and duodenum CPT copyright 2019 [employer]. All rights reserved. The codes documented in this report are preliminary and upon coder  review may  be revised to meet current compliance requirements. Otis Brace, MD Otis Brace, MD 09/29/2019 10:55:52 AM Number of Addenda: 0

## 2019-09-29 NOTE — Transfer of Care (Signed)
Immediate Anesthesia Transfer of Care Note  Patient: Allison Durham  Procedure(s) Performed: Procedure(s): ESOPHAGOGASTRODUODENOSCOPY (EGD) WITH PROPOFOL (N/A) BIOPSY HOT HEMOSTASIS (ARGON PLASMA COAGULATION/BICAP) (N/A) HEMOSTASIS CLIP PLACEMENT  Patient Location: PACU  Anesthesia Type:MAC  Level of Consciousness:  sedated, patient cooperative and responds to stimulation  Airway & Oxygen Therapy:Patient Spontanous Breathing and Patient connected to face mask oxgen  Post-op Assessment:  Report given to PACU RN and Post -op Vital signs reviewed and stable  Post vital signs:  Reviewed and stable  Last Vitals:  Vitals:   09/29/19 0909 09/29/19 1052  BP: (!) 195/82 (!) 177/101  Pulse:  90  Resp: 14 18  Temp: 36.9 C 36.8 C  SpO2: 18% 28%    Complications: No apparent anesthesia complications

## 2019-09-29 NOTE — Discharge Instructions (Signed)
YOU HAD AN ENDOSCOPIC PROCEDURE TODAY: Refer to the procedure report and other information in the discharge instructions given to you for any specific questions about what was found during the examination. If this information does not answer your questions, please call [facility] office at [phone] to clarify.   YOU SHOULD EXPECT: Some feelings of bloating in the abdomen. Passage of more gas than usual. Walking can help get rid of the air that was put into your GI tract during the procedure and reduce the bloating. If you had a lower endoscopy (such as a colonoscopy or flexible sigmoidoscopy) you may notice spotting of blood in your stool or on the toilet paper. Some abdominal soreness may be present for a day or two, also.  DIET: Your first meal following the procedure should be a light meal and then it is ok to progress to your normal diet. A half-sandwich or bowl of soup is an example of a good first meal. Heavy or fried foods are harder to digest and may make you feel nauseous or bloated. Drink plenty of fluids but you should avoid alcoholic beverages for 24 hours. If you had a esophageal dilation, please see attached instructions for diet.   ACTIVITY: Your care partner should take you home directly after the procedure. You should plan to take it easy, moving slowly for the rest of the day. You can resume normal activity the day after the procedure however YOU SHOULD NOT DRIVE, use power tools, machinery or perform tasks that involve climbing or major physical exertion for 24 hours (because of the sedation medicines used during the test).   SYMPTOMS TO REPORT IMMEDIATELY: A gastroenterologist can be reached at any hour. Please call [phone]  for any of the following symptoms:   . Following upper endoscopy (EGD, EUS, ERCP, esophageal dilation) Vomiting of blood or coffee ground material  New, significant abdominal pain  New, significant chest pain or pain under the shoulder blades  Painful or  persistently difficult swallowing  New shortness of breath  Black, tarry-looking or red, bloody stools  FOLLOW UP:  If any biopsies were taken you will be contacted by phone or by letter within the next 1-3 weeks. Call [phone]  if you have not heard about the biopsies in 3 weeks.  Please also call with any specific questions about appointments or follow up tests.       Please excuse Allison Durham from work on September 29, 2019 due to them having an outpatient procedure. She can return to work on September 30, 2019.   Thanks,    Carmie End, RN [facility]   ____________________________

## 2019-09-29 NOTE — Anesthesia Postprocedure Evaluation (Signed)
Anesthesia Post Note  Patient: Allison Durham  Procedure(s) Performed: ESOPHAGOGASTRODUODENOSCOPY (EGD) WITH PROPOFOL (N/A ) BIOPSY HOT HEMOSTASIS (ARGON PLASMA COAGULATION/BICAP) (N/A ) HEMOSTASIS CLIP PLACEMENT     Patient location during evaluation: PACU Anesthesia Type: MAC Level of consciousness: awake and alert Pain management: pain level controlled Vital Signs Assessment: post-procedure vital signs reviewed and stable Respiratory status: spontaneous breathing, nonlabored ventilation, respiratory function stable and patient connected to nasal cannula oxygen Cardiovascular status: stable and blood pressure returned to baseline Postop Assessment: no apparent nausea or vomiting Anesthetic complications: no    Last Vitals:  Vitals:   09/29/19 1100 09/29/19 1110  BP: (!) 194/91 (!) 176/92  Pulse: 89 87  Resp: 16 14  Temp:    SpO2: 99% 99%    Last Pain:  Vitals:   09/29/19 1110  TempSrc:   PainSc: 0-No pain                 Barnet Glasgow

## 2019-09-30 ENCOUNTER — Encounter: Payer: Self-pay | Admitting: *Deleted

## 2019-09-30 LAB — SURGICAL PATHOLOGY

## 2019-10-01 ENCOUNTER — Ambulatory Visit
Admission: RE | Admit: 2019-10-01 | Discharge: 2019-10-01 | Disposition: A | Discharge status: Home / Self Care | Payer: BC Managed Care – PPO | Source: Ambulatory Visit | Attending: Gastroenterology | Admitting: Gastroenterology

## 2019-10-01 ENCOUNTER — Other Ambulatory Visit: Payer: Self-pay | Admitting: Gastroenterology

## 2019-10-01 DIAGNOSIS — R112 Nausea with vomiting, unspecified: Secondary | ICD-10-CM

## 2019-10-01 DIAGNOSIS — R1030 Lower abdominal pain, unspecified: Secondary | ICD-10-CM | POA: Diagnosis not present

## 2019-10-01 IMAGING — CR DG ABDOMEN ACUTE W/ 1V CHEST
3 series · 3 of 3 positions shown · non-contrast
Comparison: Chest x-ray dated [DATE] and abdominal radiograph
dated [DATE]

CLINICAL DATA: Nausea and vomiting. Lower abdominal pain.

EXAM:
DG ABDOMEN ACUTE W/ 1V CHEST

[w chest pa]
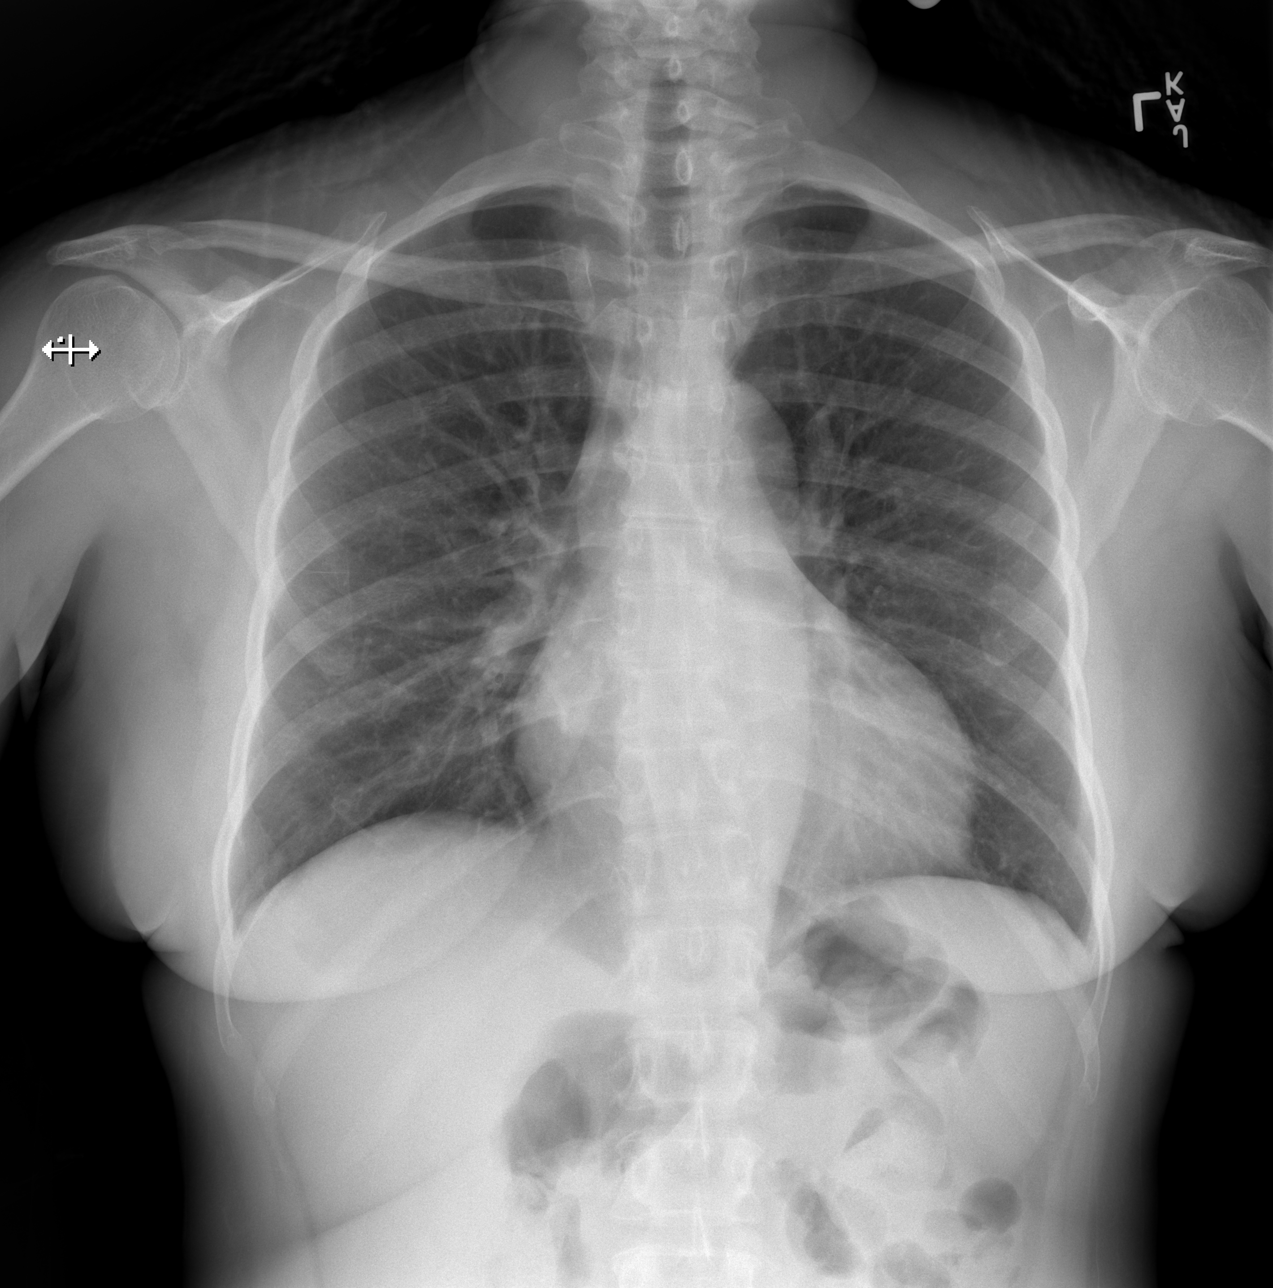

[w abdomen upright]
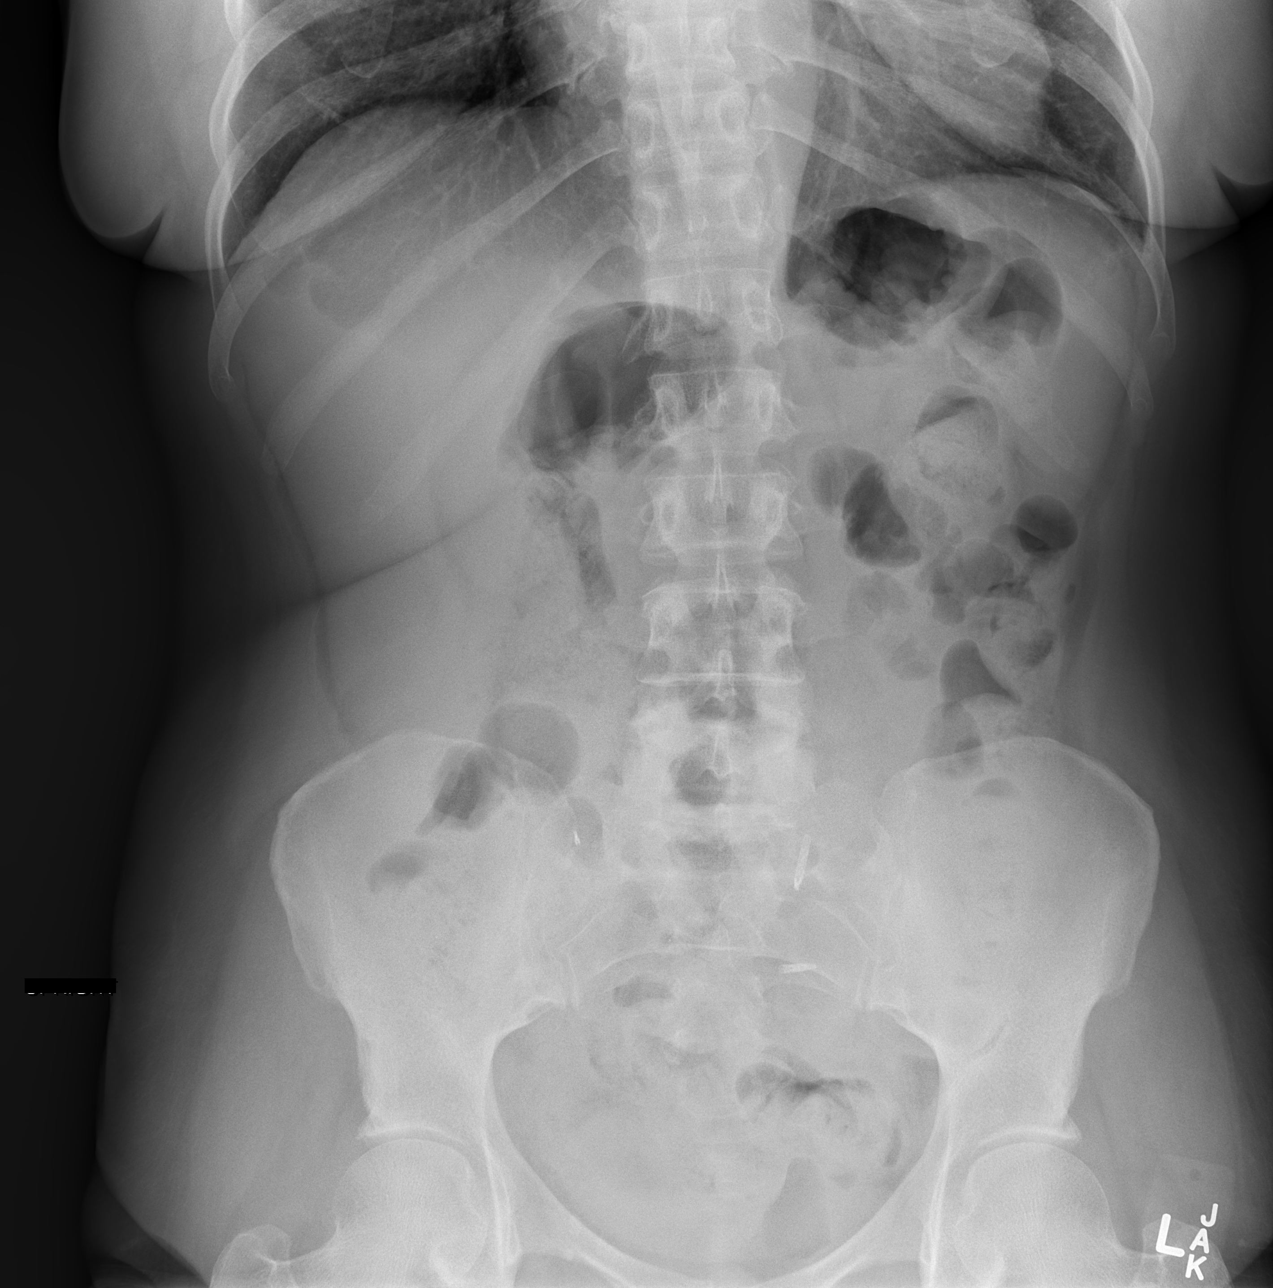

[t abdomen supine]
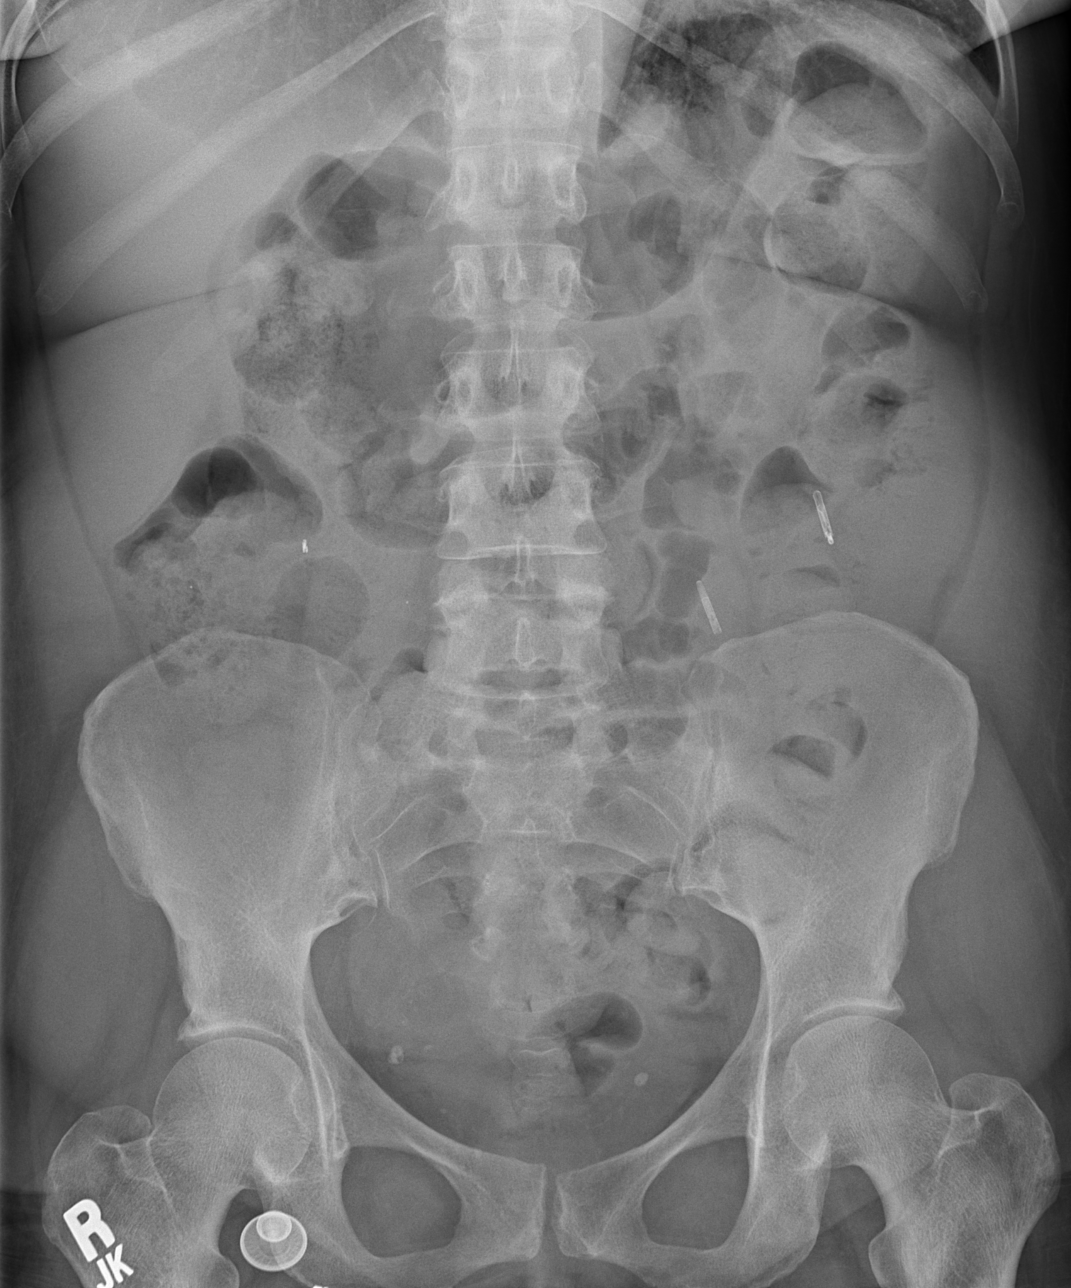

[3 of 3 positions shown; findings below may reference images not displayed]

FINDINGS: The bowel gas pattern is normal. Moderate stool in the nondistended
colon. Several radiodense clips are noted in the abdomen.
Phleboliths in the pelvis. No free air.

Heart size and vascularity are normal. Lungs are clear. No bone
abnormality.
IMPRESSION: Negative abdominal radiographs. No acute cardiopulmonary disease.

## 2019-11-05 DIAGNOSIS — N2581 Secondary hyperparathyroidism of renal origin: Secondary | ICD-10-CM | POA: Diagnosis not present

## 2019-11-05 DIAGNOSIS — N185 Chronic kidney disease, stage 5: Secondary | ICD-10-CM | POA: Diagnosis not present

## 2019-11-05 DIAGNOSIS — N189 Chronic kidney disease, unspecified: Secondary | ICD-10-CM | POA: Diagnosis not present

## 2019-11-05 DIAGNOSIS — I10 Essential (primary) hypertension: Secondary | ICD-10-CM | POA: Diagnosis not present

## 2019-11-05 DIAGNOSIS — D631 Anemia in chronic kidney disease: Secondary | ICD-10-CM | POA: Diagnosis not present

## 2019-11-15 DIAGNOSIS — N186 End stage renal disease: Secondary | ICD-10-CM | POA: Diagnosis not present

## 2019-11-15 DIAGNOSIS — N2581 Secondary hyperparathyroidism of renal origin: Secondary | ICD-10-CM | POA: Diagnosis not present

## 2019-11-15 DIAGNOSIS — Z992 Dependence on renal dialysis: Secondary | ICD-10-CM | POA: Diagnosis not present

## 2019-11-17 DIAGNOSIS — N186 End stage renal disease: Secondary | ICD-10-CM | POA: Diagnosis not present

## 2019-11-17 DIAGNOSIS — N2581 Secondary hyperparathyroidism of renal origin: Secondary | ICD-10-CM | POA: Diagnosis not present

## 2019-11-17 DIAGNOSIS — Z992 Dependence on renal dialysis: Secondary | ICD-10-CM | POA: Diagnosis not present

## 2019-11-19 ENCOUNTER — Encounter: Payer: Self-pay | Admitting: Nephrology

## 2019-11-19 DIAGNOSIS — Z992 Dependence on renal dialysis: Secondary | ICD-10-CM | POA: Diagnosis not present

## 2019-11-19 DIAGNOSIS — N2581 Secondary hyperparathyroidism of renal origin: Secondary | ICD-10-CM | POA: Diagnosis not present

## 2019-11-19 DIAGNOSIS — N186 End stage renal disease: Secondary | ICD-10-CM | POA: Diagnosis not present

## 2019-11-21 DIAGNOSIS — N186 End stage renal disease: Secondary | ICD-10-CM | POA: Diagnosis not present

## 2019-11-21 DIAGNOSIS — N2581 Secondary hyperparathyroidism of renal origin: Secondary | ICD-10-CM | POA: Diagnosis not present

## 2019-11-21 DIAGNOSIS — Z992 Dependence on renal dialysis: Secondary | ICD-10-CM | POA: Diagnosis not present

## 2019-11-24 DIAGNOSIS — Z992 Dependence on renal dialysis: Secondary | ICD-10-CM | POA: Diagnosis not present

## 2019-11-24 DIAGNOSIS — N186 End stage renal disease: Secondary | ICD-10-CM | POA: Diagnosis not present

## 2019-11-24 DIAGNOSIS — N2581 Secondary hyperparathyroidism of renal origin: Secondary | ICD-10-CM | POA: Diagnosis not present

## 2019-11-26 DIAGNOSIS — Z992 Dependence on renal dialysis: Secondary | ICD-10-CM | POA: Diagnosis not present

## 2019-11-26 DIAGNOSIS — N186 End stage renal disease: Secondary | ICD-10-CM | POA: Diagnosis not present

## 2019-11-26 DIAGNOSIS — N2581 Secondary hyperparathyroidism of renal origin: Secondary | ICD-10-CM | POA: Diagnosis not present

## 2019-11-27 ENCOUNTER — Other Ambulatory Visit (HOSPITAL_COMMUNITY): Payer: Self-pay | Admitting: *Deleted

## 2019-11-28 ENCOUNTER — Ambulatory Visit (HOSPITAL_COMMUNITY)
Admission: RE | Admit: 2019-11-28 | Discharge: 2019-11-28 | Disposition: A | Discharge status: Home / Self Care | Payer: BC Managed Care – PPO | Source: Ambulatory Visit | Attending: Nephrology | Admitting: Nephrology

## 2019-11-28 ENCOUNTER — Other Ambulatory Visit: Payer: Self-pay

## 2019-11-28 DIAGNOSIS — N189 Chronic kidney disease, unspecified: Secondary | ICD-10-CM | POA: Insufficient documentation

## 2019-11-28 DIAGNOSIS — D631 Anemia in chronic kidney disease: Secondary | ICD-10-CM | POA: Diagnosis not present

## 2019-11-28 LAB — PREPARE RBC (CROSSMATCH)

## 2019-11-28 LAB — ABO/RH: ABO/RH(D): O POS

## 2019-11-28 MED ORDER — SODIUM CHLORIDE 0.9% IV SOLUTION: Freq: Once | INTRAVENOUS | Status: DC

## 2019-11-29 LAB — BPAM RBC
Blood Product Expiration Date: 202103242359
ISSUE DATE / TIME: 202103190945
Unit Type and Rh: 9500

## 2019-11-29 LAB — TYPE AND SCREEN
ABO/RH(D): O POS
Antibody Screen: NEGATIVE
Unit division: 0

## 2019-12-01 DIAGNOSIS — N2581 Secondary hyperparathyroidism of renal origin: Secondary | ICD-10-CM | POA: Diagnosis not present

## 2019-12-01 DIAGNOSIS — Z992 Dependence on renal dialysis: Secondary | ICD-10-CM | POA: Diagnosis not present

## 2019-12-01 DIAGNOSIS — N186 End stage renal disease: Secondary | ICD-10-CM | POA: Diagnosis not present

## 2019-12-03 DIAGNOSIS — N186 End stage renal disease: Secondary | ICD-10-CM | POA: Diagnosis not present

## 2019-12-03 DIAGNOSIS — Z992 Dependence on renal dialysis: Secondary | ICD-10-CM | POA: Diagnosis not present

## 2019-12-03 DIAGNOSIS — N2581 Secondary hyperparathyroidism of renal origin: Secondary | ICD-10-CM | POA: Diagnosis not present

## 2019-12-05 DIAGNOSIS — N2581 Secondary hyperparathyroidism of renal origin: Secondary | ICD-10-CM | POA: Diagnosis not present

## 2019-12-05 DIAGNOSIS — Z992 Dependence on renal dialysis: Secondary | ICD-10-CM | POA: Diagnosis not present

## 2019-12-05 DIAGNOSIS — N186 End stage renal disease: Secondary | ICD-10-CM | POA: Diagnosis not present

## 2019-12-08 DIAGNOSIS — N2581 Secondary hyperparathyroidism of renal origin: Secondary | ICD-10-CM | POA: Diagnosis not present

## 2019-12-08 DIAGNOSIS — N186 End stage renal disease: Secondary | ICD-10-CM | POA: Diagnosis not present

## 2019-12-08 DIAGNOSIS — Z992 Dependence on renal dialysis: Secondary | ICD-10-CM | POA: Diagnosis not present

## 2019-12-09 DIAGNOSIS — E1121 Type 2 diabetes mellitus with diabetic nephropathy: Secondary | ICD-10-CM | POA: Diagnosis not present

## 2019-12-09 DIAGNOSIS — N186 End stage renal disease: Secondary | ICD-10-CM | POA: Diagnosis not present

## 2019-12-09 DIAGNOSIS — I10 Essential (primary) hypertension: Secondary | ICD-10-CM | POA: Diagnosis not present

## 2019-12-10 ENCOUNTER — Telehealth: Payer: Self-pay

## 2019-12-10 DIAGNOSIS — N186 End stage renal disease: Secondary | ICD-10-CM | POA: Diagnosis not present

## 2019-12-10 DIAGNOSIS — I871 Compression of vein: Secondary | ICD-10-CM | POA: Diagnosis not present

## 2019-12-10 DIAGNOSIS — Z992 Dependence on renal dialysis: Secondary | ICD-10-CM | POA: Diagnosis not present

## 2019-12-10 DIAGNOSIS — E1022 Type 1 diabetes mellitus with diabetic chronic kidney disease: Secondary | ICD-10-CM | POA: Diagnosis not present

## 2019-12-10 DIAGNOSIS — T82898A Other specified complication of vascular prosthetic devices, implants and grafts, initial encounter: Secondary | ICD-10-CM | POA: Diagnosis not present

## 2019-12-10 NOTE — Telephone Encounter (Signed)
Returned phone call to [facility] at [facility] in [place]. She is reporting pulling out clots out of pt's access. Since access was placed over a year ago, we discussed them f/u with IR. Levada Dy will call us back if she has any further issues/concerns.

## 2019-12-12 DIAGNOSIS — Z992 Dependence on renal dialysis: Secondary | ICD-10-CM | POA: Diagnosis not present

## 2019-12-12 DIAGNOSIS — N2581 Secondary hyperparathyroidism of renal origin: Secondary | ICD-10-CM | POA: Diagnosis not present

## 2019-12-12 DIAGNOSIS — N186 End stage renal disease: Secondary | ICD-10-CM | POA: Diagnosis not present

## 2019-12-15 DIAGNOSIS — Z992 Dependence on renal dialysis: Secondary | ICD-10-CM | POA: Diagnosis not present

## 2019-12-15 DIAGNOSIS — R52 Pain, unspecified: Secondary | ICD-10-CM | POA: Diagnosis not present

## 2019-12-15 DIAGNOSIS — R509 Fever, unspecified: Secondary | ICD-10-CM | POA: Diagnosis not present

## 2019-12-15 DIAGNOSIS — N2581 Secondary hyperparathyroidism of renal origin: Secondary | ICD-10-CM | POA: Diagnosis not present

## 2019-12-15 DIAGNOSIS — Z23 Encounter for immunization: Secondary | ICD-10-CM | POA: Diagnosis not present

## 2019-12-15 DIAGNOSIS — N186 End stage renal disease: Secondary | ICD-10-CM | POA: Diagnosis not present

## 2019-12-15 DIAGNOSIS — R519 Headache, unspecified: Secondary | ICD-10-CM | POA: Diagnosis not present

## 2019-12-17 DIAGNOSIS — Z23 Encounter for immunization: Secondary | ICD-10-CM | POA: Diagnosis not present

## 2019-12-17 DIAGNOSIS — N186 End stage renal disease: Secondary | ICD-10-CM | POA: Diagnosis not present

## 2019-12-17 DIAGNOSIS — R519 Headache, unspecified: Secondary | ICD-10-CM | POA: Diagnosis not present

## 2019-12-17 DIAGNOSIS — R52 Pain, unspecified: Secondary | ICD-10-CM | POA: Diagnosis not present

## 2019-12-17 DIAGNOSIS — N2581 Secondary hyperparathyroidism of renal origin: Secondary | ICD-10-CM | POA: Diagnosis not present

## 2019-12-17 DIAGNOSIS — R509 Fever, unspecified: Secondary | ICD-10-CM | POA: Diagnosis not present

## 2019-12-17 DIAGNOSIS — Z992 Dependence on renal dialysis: Secondary | ICD-10-CM | POA: Diagnosis not present

## 2019-12-22 DIAGNOSIS — N2581 Secondary hyperparathyroidism of renal origin: Secondary | ICD-10-CM | POA: Diagnosis not present

## 2019-12-22 DIAGNOSIS — N186 End stage renal disease: Secondary | ICD-10-CM | POA: Diagnosis not present

## 2019-12-22 DIAGNOSIS — Z992 Dependence on renal dialysis: Secondary | ICD-10-CM | POA: Diagnosis not present

## 2019-12-24 DIAGNOSIS — N2581 Secondary hyperparathyroidism of renal origin: Secondary | ICD-10-CM | POA: Diagnosis not present

## 2019-12-24 DIAGNOSIS — N186 End stage renal disease: Secondary | ICD-10-CM | POA: Diagnosis not present

## 2019-12-24 DIAGNOSIS — Z992 Dependence on renal dialysis: Secondary | ICD-10-CM | POA: Diagnosis not present

## 2019-12-26 DIAGNOSIS — N2581 Secondary hyperparathyroidism of renal origin: Secondary | ICD-10-CM | POA: Diagnosis not present

## 2019-12-26 DIAGNOSIS — Z992 Dependence on renal dialysis: Secondary | ICD-10-CM | POA: Diagnosis not present

## 2019-12-26 DIAGNOSIS — N186 End stage renal disease: Secondary | ICD-10-CM | POA: Diagnosis not present

## 2019-12-29 DIAGNOSIS — N186 End stage renal disease: Secondary | ICD-10-CM | POA: Diagnosis not present

## 2019-12-29 DIAGNOSIS — N2581 Secondary hyperparathyroidism of renal origin: Secondary | ICD-10-CM | POA: Diagnosis not present

## 2019-12-29 DIAGNOSIS — Z992 Dependence on renal dialysis: Secondary | ICD-10-CM | POA: Diagnosis not present

## 2019-12-31 DIAGNOSIS — N186 End stage renal disease: Secondary | ICD-10-CM | POA: Diagnosis not present

## 2019-12-31 DIAGNOSIS — N2581 Secondary hyperparathyroidism of renal origin: Secondary | ICD-10-CM | POA: Diagnosis not present

## 2019-12-31 DIAGNOSIS — Z992 Dependence on renal dialysis: Secondary | ICD-10-CM | POA: Diagnosis not present

## 2020-01-02 DIAGNOSIS — R809 Proteinuria, unspecified: Secondary | ICD-10-CM | POA: Diagnosis not present

## 2020-01-02 DIAGNOSIS — D649 Anemia, unspecified: Secondary | ICD-10-CM | POA: Diagnosis not present

## 2020-01-02 DIAGNOSIS — I10 Essential (primary) hypertension: Secondary | ICD-10-CM | POA: Diagnosis not present

## 2020-01-02 DIAGNOSIS — E1165 Type 2 diabetes mellitus with hyperglycemia: Secondary | ICD-10-CM | POA: Diagnosis not present

## 2020-01-02 DIAGNOSIS — N189 Chronic kidney disease, unspecified: Secondary | ICD-10-CM | POA: Diagnosis not present

## 2020-01-07 DIAGNOSIS — N2581 Secondary hyperparathyroidism of renal origin: Secondary | ICD-10-CM | POA: Diagnosis not present

## 2020-01-07 DIAGNOSIS — N186 End stage renal disease: Secondary | ICD-10-CM | POA: Diagnosis not present

## 2020-01-07 DIAGNOSIS — Z992 Dependence on renal dialysis: Secondary | ICD-10-CM | POA: Diagnosis not present

## 2020-01-09 DIAGNOSIS — Z992 Dependence on renal dialysis: Secondary | ICD-10-CM | POA: Diagnosis not present

## 2020-01-09 DIAGNOSIS — E1022 Type 1 diabetes mellitus with diabetic chronic kidney disease: Secondary | ICD-10-CM | POA: Diagnosis not present

## 2020-01-09 DIAGNOSIS — N2581 Secondary hyperparathyroidism of renal origin: Secondary | ICD-10-CM | POA: Diagnosis not present

## 2020-01-09 DIAGNOSIS — N186 End stage renal disease: Secondary | ICD-10-CM | POA: Diagnosis not present

## 2020-01-12 DIAGNOSIS — N2581 Secondary hyperparathyroidism of renal origin: Secondary | ICD-10-CM | POA: Diagnosis not present

## 2020-01-12 DIAGNOSIS — N186 End stage renal disease: Secondary | ICD-10-CM | POA: Diagnosis not present

## 2020-01-12 DIAGNOSIS — Z992 Dependence on renal dialysis: Secondary | ICD-10-CM | POA: Diagnosis not present

## 2020-01-14 DIAGNOSIS — N2581 Secondary hyperparathyroidism of renal origin: Secondary | ICD-10-CM | POA: Diagnosis not present

## 2020-01-14 DIAGNOSIS — Z992 Dependence on renal dialysis: Secondary | ICD-10-CM | POA: Diagnosis not present

## 2020-01-14 DIAGNOSIS — N186 End stage renal disease: Secondary | ICD-10-CM | POA: Diagnosis not present

## 2020-01-16 DIAGNOSIS — Z992 Dependence on renal dialysis: Secondary | ICD-10-CM | POA: Diagnosis not present

## 2020-01-16 DIAGNOSIS — N2581 Secondary hyperparathyroidism of renal origin: Secondary | ICD-10-CM | POA: Diagnosis not present

## 2020-01-16 DIAGNOSIS — N186 End stage renal disease: Secondary | ICD-10-CM | POA: Diagnosis not present

## 2020-01-19 DIAGNOSIS — N2581 Secondary hyperparathyroidism of renal origin: Secondary | ICD-10-CM | POA: Diagnosis not present

## 2020-01-19 DIAGNOSIS — Z992 Dependence on renal dialysis: Secondary | ICD-10-CM | POA: Diagnosis not present

## 2020-01-19 DIAGNOSIS — N186 End stage renal disease: Secondary | ICD-10-CM | POA: Diagnosis not present

## 2020-01-21 DIAGNOSIS — Z992 Dependence on renal dialysis: Secondary | ICD-10-CM | POA: Diagnosis not present

## 2020-01-21 DIAGNOSIS — N186 End stage renal disease: Secondary | ICD-10-CM | POA: Diagnosis not present

## 2020-01-21 DIAGNOSIS — N2581 Secondary hyperparathyroidism of renal origin: Secondary | ICD-10-CM | POA: Diagnosis not present

## 2020-01-25 DIAGNOSIS — Z992 Dependence on renal dialysis: Secondary | ICD-10-CM | POA: Diagnosis not present

## 2020-01-25 DIAGNOSIS — E1022 Type 1 diabetes mellitus with diabetic chronic kidney disease: Secondary | ICD-10-CM | POA: Diagnosis not present

## 2020-01-25 DIAGNOSIS — N186 End stage renal disease: Secondary | ICD-10-CM | POA: Diagnosis not present

## 2020-01-26 DIAGNOSIS — N2581 Secondary hyperparathyroidism of renal origin: Secondary | ICD-10-CM | POA: Diagnosis not present

## 2020-01-26 DIAGNOSIS — N186 End stage renal disease: Secondary | ICD-10-CM | POA: Diagnosis not present

## 2020-01-26 DIAGNOSIS — Z992 Dependence on renal dialysis: Secondary | ICD-10-CM | POA: Diagnosis not present

## 2020-01-27 DIAGNOSIS — E1122 Type 2 diabetes mellitus with diabetic chronic kidney disease: Secondary | ICD-10-CM | POA: Diagnosis not present

## 2020-01-27 DIAGNOSIS — K219 Gastro-esophageal reflux disease without esophagitis: Secondary | ICD-10-CM | POA: Diagnosis not present

## 2020-01-27 DIAGNOSIS — Z01818 Encounter for other preprocedural examination: Secondary | ICD-10-CM | POA: Diagnosis not present

## 2020-01-27 DIAGNOSIS — Z833 Family history of diabetes mellitus: Secondary | ICD-10-CM | POA: Diagnosis not present

## 2020-01-27 DIAGNOSIS — N2581 Secondary hyperparathyroidism of renal origin: Secondary | ICD-10-CM | POA: Diagnosis not present

## 2020-01-27 DIAGNOSIS — Z5181 Encounter for therapeutic drug level monitoring: Secondary | ICD-10-CM | POA: Diagnosis not present

## 2020-01-27 DIAGNOSIS — Z940 Kidney transplant status: Secondary | ICD-10-CM | POA: Diagnosis not present

## 2020-01-27 DIAGNOSIS — I120 Hypertensive chronic kidney disease with stage 5 chronic kidney disease or end stage renal disease: Secondary | ICD-10-CM | POA: Diagnosis not present

## 2020-01-27 DIAGNOSIS — Z841 Family history of disorders of kidney and ureter: Secondary | ICD-10-CM | POA: Diagnosis not present

## 2020-01-27 DIAGNOSIS — E119 Type 2 diabetes mellitus without complications: Secondary | ICD-10-CM | POA: Diagnosis not present

## 2020-01-27 DIAGNOSIS — Z8249 Family history of ischemic heart disease and other diseases of the circulatory system: Secondary | ICD-10-CM | POA: Diagnosis not present

## 2020-01-27 DIAGNOSIS — Z8679 Personal history of other diseases of the circulatory system: Secondary | ICD-10-CM | POA: Diagnosis not present

## 2020-01-27 DIAGNOSIS — Z79899 Other long term (current) drug therapy: Secondary | ICD-10-CM | POA: Diagnosis not present

## 2020-01-27 DIAGNOSIS — Z4822 Encounter for aftercare following kidney transplant: Secondary | ICD-10-CM | POA: Diagnosis not present

## 2020-01-27 DIAGNOSIS — N170 Acute kidney failure with tubular necrosis: Secondary | ICD-10-CM | POA: Diagnosis not present

## 2020-01-27 DIAGNOSIS — Z7952 Long term (current) use of systemic steroids: Secondary | ICD-10-CM | POA: Diagnosis not present

## 2020-01-27 DIAGNOSIS — N186 End stage renal disease: Secondary | ICD-10-CM | POA: Diagnosis not present

## 2020-01-27 DIAGNOSIS — N269 Renal sclerosis, unspecified: Secondary | ICD-10-CM | POA: Diagnosis not present

## 2020-01-27 DIAGNOSIS — G629 Polyneuropathy, unspecified: Secondary | ICD-10-CM | POA: Diagnosis not present

## 2020-01-27 DIAGNOSIS — I10 Essential (primary) hypertension: Secondary | ICD-10-CM | POA: Diagnosis not present

## 2020-01-27 DIAGNOSIS — Z992 Dependence on renal dialysis: Secondary | ICD-10-CM | POA: Diagnosis not present

## 2020-01-27 DIAGNOSIS — D631 Anemia in chronic kidney disease: Secondary | ICD-10-CM | POA: Diagnosis not present

## 2020-01-27 DIAGNOSIS — Z794 Long term (current) use of insulin: Secondary | ICD-10-CM | POA: Diagnosis not present

## 2020-01-27 DIAGNOSIS — N185 Chronic kidney disease, stage 5: Secondary | ICD-10-CM | POA: Diagnosis not present

## 2020-01-28 DIAGNOSIS — E1122 Type 2 diabetes mellitus with diabetic chronic kidney disease: Secondary | ICD-10-CM | POA: Diagnosis not present

## 2020-01-28 DIAGNOSIS — Z4822 Encounter for aftercare following kidney transplant: Secondary | ICD-10-CM | POA: Diagnosis not present

## 2020-01-28 DIAGNOSIS — E119 Type 2 diabetes mellitus without complications: Secondary | ICD-10-CM | POA: Diagnosis not present

## 2020-01-28 DIAGNOSIS — Z7952 Long term (current) use of systemic steroids: Secondary | ICD-10-CM | POA: Diagnosis not present

## 2020-01-28 DIAGNOSIS — I10 Essential (primary) hypertension: Secondary | ICD-10-CM | POA: Diagnosis not present

## 2020-01-28 DIAGNOSIS — Z5181 Encounter for therapeutic drug level monitoring: Secondary | ICD-10-CM | POA: Diagnosis not present

## 2020-01-28 DIAGNOSIS — Z79899 Other long term (current) drug therapy: Secondary | ICD-10-CM | POA: Diagnosis not present

## 2020-01-28 DIAGNOSIS — Z940 Kidney transplant status: Secondary | ICD-10-CM | POA: Diagnosis not present

## 2020-01-30 DIAGNOSIS — E1122 Type 2 diabetes mellitus with diabetic chronic kidney disease: Secondary | ICD-10-CM | POA: Diagnosis not present

## 2020-01-30 DIAGNOSIS — Z5181 Encounter for therapeutic drug level monitoring: Secondary | ICD-10-CM | POA: Diagnosis not present

## 2020-01-30 DIAGNOSIS — Z4822 Encounter for aftercare following kidney transplant: Secondary | ICD-10-CM | POA: Diagnosis not present

## 2020-01-30 DIAGNOSIS — Z79899 Other long term (current) drug therapy: Secondary | ICD-10-CM | POA: Diagnosis not present

## 2020-02-03 DIAGNOSIS — Z7951 Long term (current) use of inhaled steroids: Secondary | ICD-10-CM | POA: Diagnosis not present

## 2020-02-03 DIAGNOSIS — Z79899 Other long term (current) drug therapy: Secondary | ICD-10-CM | POA: Diagnosis not present

## 2020-02-03 DIAGNOSIS — Z940 Kidney transplant status: Secondary | ICD-10-CM | POA: Diagnosis not present

## 2020-02-03 DIAGNOSIS — D8989 Other specified disorders involving the immune mechanism, not elsewhere classified: Secondary | ICD-10-CM | POA: Diagnosis not present

## 2020-02-03 DIAGNOSIS — Z794 Long term (current) use of insulin: Secondary | ICD-10-CM | POA: Diagnosis not present

## 2020-02-03 DIAGNOSIS — E785 Hyperlipidemia, unspecified: Secondary | ICD-10-CM | POA: Diagnosis not present

## 2020-02-03 DIAGNOSIS — Z7952 Long term (current) use of systemic steroids: Secondary | ICD-10-CM | POA: Diagnosis not present

## 2020-02-03 DIAGNOSIS — Z792 Long term (current) use of antibiotics: Secondary | ICD-10-CM | POA: Diagnosis not present

## 2020-02-03 DIAGNOSIS — E1165 Type 2 diabetes mellitus with hyperglycemia: Secondary | ICD-10-CM | POA: Diagnosis not present

## 2020-02-03 DIAGNOSIS — I671 Cerebral aneurysm, nonruptured: Secondary | ICD-10-CM | POA: Diagnosis not present

## 2020-02-03 DIAGNOSIS — Z4822 Encounter for aftercare following kidney transplant: Secondary | ICD-10-CM | POA: Diagnosis not present

## 2020-02-03 DIAGNOSIS — I10 Essential (primary) hypertension: Secondary | ICD-10-CM | POA: Diagnosis not present

## 2020-02-03 DIAGNOSIS — E119 Type 2 diabetes mellitus without complications: Secondary | ICD-10-CM | POA: Diagnosis not present

## 2020-02-03 DIAGNOSIS — D649 Anemia, unspecified: Secondary | ICD-10-CM | POA: Diagnosis not present

## 2020-02-06 DIAGNOSIS — Z940 Kidney transplant status: Secondary | ICD-10-CM | POA: Diagnosis not present

## 2020-02-06 DIAGNOSIS — Z792 Long term (current) use of antibiotics: Secondary | ICD-10-CM | POA: Diagnosis not present

## 2020-02-06 DIAGNOSIS — G8918 Other acute postprocedural pain: Secondary | ICD-10-CM | POA: Diagnosis not present

## 2020-02-06 DIAGNOSIS — Z960 Presence of urogenital implants: Secondary | ICD-10-CM | POA: Diagnosis not present

## 2020-02-06 DIAGNOSIS — E1121 Type 2 diabetes mellitus with diabetic nephropathy: Secondary | ICD-10-CM | POA: Diagnosis not present

## 2020-02-06 DIAGNOSIS — Z7952 Long term (current) use of systemic steroids: Secondary | ICD-10-CM | POA: Diagnosis not present

## 2020-02-06 DIAGNOSIS — D849 Immunodeficiency, unspecified: Secondary | ICD-10-CM | POA: Diagnosis not present

## 2020-02-06 DIAGNOSIS — Z4822 Encounter for aftercare following kidney transplant: Secondary | ICD-10-CM | POA: Diagnosis not present

## 2020-02-06 DIAGNOSIS — E785 Hyperlipidemia, unspecified: Secondary | ICD-10-CM | POA: Diagnosis not present

## 2020-02-06 DIAGNOSIS — Z5181 Encounter for therapeutic drug level monitoring: Secondary | ICD-10-CM | POA: Diagnosis not present

## 2020-02-06 DIAGNOSIS — I10 Essential (primary) hypertension: Secondary | ICD-10-CM | POA: Diagnosis not present

## 2020-02-06 DIAGNOSIS — Z794 Long term (current) use of insulin: Secondary | ICD-10-CM | POA: Diagnosis not present

## 2020-02-06 DIAGNOSIS — I671 Cerebral aneurysm, nonruptured: Secondary | ICD-10-CM | POA: Diagnosis not present

## 2020-02-06 DIAGNOSIS — Z79899 Other long term (current) drug therapy: Secondary | ICD-10-CM | POA: Diagnosis not present

## 2020-02-06 DIAGNOSIS — E1165 Type 2 diabetes mellitus with hyperglycemia: Secondary | ICD-10-CM | POA: Diagnosis not present

## 2020-02-06 DIAGNOSIS — Z9889 Other specified postprocedural states: Secondary | ICD-10-CM | POA: Diagnosis not present

## 2020-02-06 DIAGNOSIS — Z8679 Personal history of other diseases of the circulatory system: Secondary | ICD-10-CM | POA: Diagnosis not present

## 2020-02-06 DIAGNOSIS — D649 Anemia, unspecified: Secondary | ICD-10-CM | POA: Diagnosis not present

## 2020-02-10 DIAGNOSIS — Z794 Long term (current) use of insulin: Secondary | ICD-10-CM | POA: Diagnosis not present

## 2020-02-10 DIAGNOSIS — Z792 Long term (current) use of antibiotics: Secondary | ICD-10-CM | POA: Diagnosis not present

## 2020-02-10 DIAGNOSIS — Z960 Presence of urogenital implants: Secondary | ICD-10-CM | POA: Diagnosis not present

## 2020-02-10 DIAGNOSIS — Z4822 Encounter for aftercare following kidney transplant: Secondary | ICD-10-CM | POA: Diagnosis not present

## 2020-02-10 DIAGNOSIS — E785 Hyperlipidemia, unspecified: Secondary | ICD-10-CM | POA: Diagnosis not present

## 2020-02-10 DIAGNOSIS — I671 Cerebral aneurysm, nonruptured: Secondary | ICD-10-CM | POA: Diagnosis not present

## 2020-02-10 DIAGNOSIS — E1165 Type 2 diabetes mellitus with hyperglycemia: Secondary | ICD-10-CM | POA: Diagnosis not present

## 2020-02-10 DIAGNOSIS — I609 Nontraumatic subarachnoid hemorrhage, unspecified: Secondary | ICD-10-CM | POA: Diagnosis not present

## 2020-02-10 DIAGNOSIS — Z8679 Personal history of other diseases of the circulatory system: Secondary | ICD-10-CM | POA: Diagnosis not present

## 2020-02-10 DIAGNOSIS — I10 Essential (primary) hypertension: Secondary | ICD-10-CM | POA: Diagnosis not present

## 2020-02-10 DIAGNOSIS — Z5181 Encounter for therapeutic drug level monitoring: Secondary | ICD-10-CM | POA: Diagnosis not present

## 2020-02-10 DIAGNOSIS — Z940 Kidney transplant status: Secondary | ICD-10-CM | POA: Diagnosis not present

## 2020-02-10 DIAGNOSIS — Z7952 Long term (current) use of systemic steroids: Secondary | ICD-10-CM | POA: Diagnosis not present

## 2020-02-10 DIAGNOSIS — D849 Immunodeficiency, unspecified: Secondary | ICD-10-CM | POA: Diagnosis not present

## 2020-02-10 DIAGNOSIS — Z9889 Other specified postprocedural states: Secondary | ICD-10-CM | POA: Diagnosis not present

## 2020-02-10 DIAGNOSIS — E1121 Type 2 diabetes mellitus with diabetic nephropathy: Secondary | ICD-10-CM | POA: Diagnosis not present

## 2020-02-10 DIAGNOSIS — G8918 Other acute postprocedural pain: Secondary | ICD-10-CM | POA: Diagnosis not present

## 2020-02-13 DIAGNOSIS — Z960 Presence of urogenital implants: Secondary | ICD-10-CM | POA: Diagnosis not present

## 2020-02-13 DIAGNOSIS — Z940 Kidney transplant status: Secondary | ICD-10-CM | POA: Diagnosis not present

## 2020-02-13 DIAGNOSIS — D849 Immunodeficiency, unspecified: Secondary | ICD-10-CM | POA: Diagnosis not present

## 2020-02-13 DIAGNOSIS — Z79899 Other long term (current) drug therapy: Secondary | ICD-10-CM | POA: Diagnosis not present

## 2020-02-13 DIAGNOSIS — E1165 Type 2 diabetes mellitus with hyperglycemia: Secondary | ICD-10-CM | POA: Diagnosis not present

## 2020-02-13 DIAGNOSIS — E785 Hyperlipidemia, unspecified: Secondary | ICD-10-CM | POA: Diagnosis not present

## 2020-02-13 DIAGNOSIS — I671 Cerebral aneurysm, nonruptured: Secondary | ICD-10-CM | POA: Diagnosis not present

## 2020-02-13 DIAGNOSIS — Z4822 Encounter for aftercare following kidney transplant: Secondary | ICD-10-CM | POA: Diagnosis not present

## 2020-02-13 DIAGNOSIS — Z7952 Long term (current) use of systemic steroids: Secondary | ICD-10-CM | POA: Diagnosis not present

## 2020-02-13 DIAGNOSIS — Z5181 Encounter for therapeutic drug level monitoring: Secondary | ICD-10-CM | POA: Diagnosis not present

## 2020-02-13 DIAGNOSIS — I10 Essential (primary) hypertension: Secondary | ICD-10-CM | POA: Diagnosis not present

## 2020-02-13 DIAGNOSIS — Z8679 Personal history of other diseases of the circulatory system: Secondary | ICD-10-CM | POA: Diagnosis not present

## 2020-02-13 DIAGNOSIS — G8918 Other acute postprocedural pain: Secondary | ICD-10-CM | POA: Diagnosis not present

## 2020-02-13 DIAGNOSIS — Z792 Long term (current) use of antibiotics: Secondary | ICD-10-CM | POA: Diagnosis not present

## 2020-02-13 DIAGNOSIS — Z794 Long term (current) use of insulin: Secondary | ICD-10-CM | POA: Diagnosis not present

## 2020-02-13 DIAGNOSIS — E1121 Type 2 diabetes mellitus with diabetic nephropathy: Secondary | ICD-10-CM | POA: Diagnosis not present

## 2020-02-13 DIAGNOSIS — D649 Anemia, unspecified: Secondary | ICD-10-CM | POA: Diagnosis not present

## 2020-02-17 DIAGNOSIS — Z940 Kidney transplant status: Secondary | ICD-10-CM | POA: Diagnosis not present

## 2020-02-17 DIAGNOSIS — E785 Hyperlipidemia, unspecified: Secondary | ICD-10-CM | POA: Diagnosis not present

## 2020-02-17 DIAGNOSIS — Z7952 Long term (current) use of systemic steroids: Secondary | ICD-10-CM | POA: Diagnosis not present

## 2020-02-17 DIAGNOSIS — D849 Immunodeficiency, unspecified: Secondary | ICD-10-CM | POA: Diagnosis not present

## 2020-02-17 DIAGNOSIS — E119 Type 2 diabetes mellitus without complications: Secondary | ICD-10-CM | POA: Diagnosis not present

## 2020-02-17 DIAGNOSIS — I609 Nontraumatic subarachnoid hemorrhage, unspecified: Secondary | ICD-10-CM | POA: Diagnosis not present

## 2020-02-17 DIAGNOSIS — E1121 Type 2 diabetes mellitus with diabetic nephropathy: Secondary | ICD-10-CM | POA: Diagnosis not present

## 2020-02-17 DIAGNOSIS — Z5181 Encounter for therapeutic drug level monitoring: Secondary | ICD-10-CM | POA: Diagnosis not present

## 2020-02-17 DIAGNOSIS — Z8679 Personal history of other diseases of the circulatory system: Secondary | ICD-10-CM | POA: Diagnosis not present

## 2020-02-17 DIAGNOSIS — I10 Essential (primary) hypertension: Secondary | ICD-10-CM | POA: Diagnosis not present

## 2020-02-17 DIAGNOSIS — Z4822 Encounter for aftercare following kidney transplant: Secondary | ICD-10-CM | POA: Diagnosis not present

## 2020-02-17 DIAGNOSIS — Z9889 Other specified postprocedural states: Secondary | ICD-10-CM | POA: Diagnosis not present

## 2020-02-17 DIAGNOSIS — D649 Anemia, unspecified: Secondary | ICD-10-CM | POA: Diagnosis not present

## 2020-02-17 DIAGNOSIS — Z794 Long term (current) use of insulin: Secondary | ICD-10-CM | POA: Diagnosis not present

## 2020-02-17 DIAGNOSIS — Z792 Long term (current) use of antibiotics: Secondary | ICD-10-CM | POA: Diagnosis not present

## 2020-02-17 DIAGNOSIS — Z466 Encounter for fitting and adjustment of urinary device: Secondary | ICD-10-CM | POA: Diagnosis not present

## 2020-02-17 DIAGNOSIS — Z79899 Other long term (current) drug therapy: Secondary | ICD-10-CM | POA: Diagnosis not present

## 2020-02-20 DIAGNOSIS — I10 Essential (primary) hypertension: Secondary | ICD-10-CM | POA: Diagnosis not present

## 2020-02-20 DIAGNOSIS — Z5181 Encounter for therapeutic drug level monitoring: Secondary | ICD-10-CM | POA: Diagnosis not present

## 2020-02-20 DIAGNOSIS — Z79899 Other long term (current) drug therapy: Secondary | ICD-10-CM | POA: Diagnosis not present

## 2020-02-20 DIAGNOSIS — Z794 Long term (current) use of insulin: Secondary | ICD-10-CM | POA: Diagnosis not present

## 2020-02-20 DIAGNOSIS — D649 Anemia, unspecified: Secondary | ICD-10-CM | POA: Diagnosis not present

## 2020-02-20 DIAGNOSIS — E1121 Type 2 diabetes mellitus with diabetic nephropathy: Secondary | ICD-10-CM | POA: Diagnosis not present

## 2020-02-20 DIAGNOSIS — E1165 Type 2 diabetes mellitus with hyperglycemia: Secondary | ICD-10-CM | POA: Diagnosis not present

## 2020-02-20 DIAGNOSIS — Z8679 Personal history of other diseases of the circulatory system: Secondary | ICD-10-CM | POA: Diagnosis not present

## 2020-02-20 DIAGNOSIS — I671 Cerebral aneurysm, nonruptured: Secondary | ICD-10-CM | POA: Diagnosis not present

## 2020-02-20 DIAGNOSIS — Z9889 Other specified postprocedural states: Secondary | ICD-10-CM | POA: Diagnosis not present

## 2020-02-20 DIAGNOSIS — Z792 Long term (current) use of antibiotics: Secondary | ICD-10-CM | POA: Diagnosis not present

## 2020-02-20 DIAGNOSIS — Z7952 Long term (current) use of systemic steroids: Secondary | ICD-10-CM | POA: Diagnosis not present

## 2020-02-20 DIAGNOSIS — Z4822 Encounter for aftercare following kidney transplant: Secondary | ICD-10-CM | POA: Diagnosis not present

## 2020-02-20 DIAGNOSIS — D849 Immunodeficiency, unspecified: Secondary | ICD-10-CM | POA: Diagnosis not present

## 2020-02-20 DIAGNOSIS — E785 Hyperlipidemia, unspecified: Secondary | ICD-10-CM | POA: Diagnosis not present

## 2020-02-20 DIAGNOSIS — Z940 Kidney transplant status: Secondary | ICD-10-CM | POA: Diagnosis not present

## 2020-02-24 DIAGNOSIS — Z79899 Other long term (current) drug therapy: Secondary | ICD-10-CM | POA: Diagnosis not present

## 2020-02-24 DIAGNOSIS — Z5181 Encounter for therapeutic drug level monitoring: Secondary | ICD-10-CM | POA: Diagnosis not present

## 2020-02-24 DIAGNOSIS — Z792 Long term (current) use of antibiotics: Secondary | ICD-10-CM | POA: Diagnosis not present

## 2020-02-24 DIAGNOSIS — Z8679 Personal history of other diseases of the circulatory system: Secondary | ICD-10-CM | POA: Diagnosis not present

## 2020-02-24 DIAGNOSIS — E1122 Type 2 diabetes mellitus with diabetic chronic kidney disease: Secondary | ICD-10-CM | POA: Diagnosis not present

## 2020-02-24 DIAGNOSIS — Z940 Kidney transplant status: Secondary | ICD-10-CM | POA: Diagnosis not present

## 2020-02-24 DIAGNOSIS — Z7952 Long term (current) use of systemic steroids: Secondary | ICD-10-CM | POA: Diagnosis not present

## 2020-02-24 DIAGNOSIS — Z4822 Encounter for aftercare following kidney transplant: Secondary | ICD-10-CM | POA: Diagnosis not present

## 2020-02-24 DIAGNOSIS — I10 Essential (primary) hypertension: Secondary | ICD-10-CM | POA: Diagnosis not present

## 2020-02-24 DIAGNOSIS — Z794 Long term (current) use of insulin: Secondary | ICD-10-CM | POA: Diagnosis not present

## 2020-02-24 DIAGNOSIS — I671 Cerebral aneurysm, nonruptured: Secondary | ICD-10-CM | POA: Diagnosis not present

## 2020-02-24 DIAGNOSIS — Z7982 Long term (current) use of aspirin: Secondary | ICD-10-CM | POA: Diagnosis not present

## 2020-02-24 DIAGNOSIS — E119 Type 2 diabetes mellitus without complications: Secondary | ICD-10-CM | POA: Diagnosis not present

## 2020-02-24 DIAGNOSIS — D849 Immunodeficiency, unspecified: Secondary | ICD-10-CM | POA: Diagnosis not present

## 2020-02-27 DIAGNOSIS — Z5181 Encounter for therapeutic drug level monitoring: Secondary | ICD-10-CM | POA: Diagnosis not present

## 2020-02-27 DIAGNOSIS — Z9889 Other specified postprocedural states: Secondary | ICD-10-CM | POA: Diagnosis not present

## 2020-02-27 DIAGNOSIS — E1165 Type 2 diabetes mellitus with hyperglycemia: Secondary | ICD-10-CM | POA: Diagnosis not present

## 2020-02-27 DIAGNOSIS — Z8679 Personal history of other diseases of the circulatory system: Secondary | ICD-10-CM | POA: Diagnosis not present

## 2020-02-27 DIAGNOSIS — E785 Hyperlipidemia, unspecified: Secondary | ICD-10-CM | POA: Diagnosis not present

## 2020-02-27 DIAGNOSIS — D649 Anemia, unspecified: Secondary | ICD-10-CM | POA: Diagnosis not present

## 2020-02-27 DIAGNOSIS — Z4822 Encounter for aftercare following kidney transplant: Secondary | ICD-10-CM | POA: Diagnosis not present

## 2020-02-27 DIAGNOSIS — D849 Immunodeficiency, unspecified: Secondary | ICD-10-CM | POA: Diagnosis not present

## 2020-02-27 DIAGNOSIS — Z79899 Other long term (current) drug therapy: Secondary | ICD-10-CM | POA: Diagnosis not present

## 2020-02-27 DIAGNOSIS — I671 Cerebral aneurysm, nonruptured: Secondary | ICD-10-CM | POA: Diagnosis not present

## 2020-02-27 DIAGNOSIS — E1121 Type 2 diabetes mellitus with diabetic nephropathy: Secondary | ICD-10-CM | POA: Diagnosis not present

## 2020-02-27 DIAGNOSIS — Z794 Long term (current) use of insulin: Secondary | ICD-10-CM | POA: Diagnosis not present

## 2020-02-27 DIAGNOSIS — Z940 Kidney transplant status: Secondary | ICD-10-CM | POA: Diagnosis not present

## 2020-02-27 DIAGNOSIS — I10 Essential (primary) hypertension: Secondary | ICD-10-CM | POA: Diagnosis not present

## 2020-02-27 DIAGNOSIS — Z7952 Long term (current) use of systemic steroids: Secondary | ICD-10-CM | POA: Diagnosis not present

## 2020-02-27 DIAGNOSIS — Z792 Long term (current) use of antibiotics: Secondary | ICD-10-CM | POA: Diagnosis not present

## 2020-03-01 DIAGNOSIS — Z940 Kidney transplant status: Secondary | ICD-10-CM | POA: Diagnosis not present

## 2020-03-01 DIAGNOSIS — E785 Hyperlipidemia, unspecified: Secondary | ICD-10-CM | POA: Diagnosis not present

## 2020-03-01 DIAGNOSIS — Z794 Long term (current) use of insulin: Secondary | ICD-10-CM | POA: Diagnosis not present

## 2020-03-01 DIAGNOSIS — I671 Cerebral aneurysm, nonruptured: Secondary | ICD-10-CM | POA: Diagnosis not present

## 2020-03-01 DIAGNOSIS — E1121 Type 2 diabetes mellitus with diabetic nephropathy: Secondary | ICD-10-CM | POA: Diagnosis not present

## 2020-03-01 DIAGNOSIS — D649 Anemia, unspecified: Secondary | ICD-10-CM | POA: Diagnosis not present

## 2020-03-01 DIAGNOSIS — E1169 Type 2 diabetes mellitus with other specified complication: Secondary | ICD-10-CM | POA: Diagnosis not present

## 2020-03-01 DIAGNOSIS — I10 Essential (primary) hypertension: Secondary | ICD-10-CM | POA: Diagnosis not present

## 2020-03-01 DIAGNOSIS — Z79899 Other long term (current) drug therapy: Secondary | ICD-10-CM | POA: Diagnosis not present

## 2020-03-01 DIAGNOSIS — D849 Immunodeficiency, unspecified: Secondary | ICD-10-CM | POA: Diagnosis not present

## 2020-03-01 DIAGNOSIS — Z4822 Encounter for aftercare following kidney transplant: Secondary | ICD-10-CM | POA: Diagnosis not present

## 2020-03-01 DIAGNOSIS — R9341 Abnormal radiologic findings on diagnostic imaging of renal pelvis, ureter, or bladder: Secondary | ICD-10-CM | POA: Diagnosis not present

## 2020-03-04 DIAGNOSIS — Z940 Kidney transplant status: Secondary | ICD-10-CM | POA: Diagnosis not present

## 2020-03-04 DIAGNOSIS — N261 Atrophy of kidney (terminal): Secondary | ICD-10-CM | POA: Diagnosis not present

## 2020-03-04 DIAGNOSIS — Z5181 Encounter for therapeutic drug level monitoring: Secondary | ICD-10-CM | POA: Diagnosis not present

## 2020-03-04 DIAGNOSIS — I7789 Other specified disorders of arteries and arterioles: Secondary | ICD-10-CM | POA: Diagnosis not present

## 2020-03-04 DIAGNOSIS — Z4822 Encounter for aftercare following kidney transplant: Secondary | ICD-10-CM | POA: Diagnosis not present

## 2020-03-09 DIAGNOSIS — I7090 Unspecified atherosclerosis: Secondary | ICD-10-CM | POA: Diagnosis not present

## 2020-03-09 DIAGNOSIS — N186 End stage renal disease: Secondary | ICD-10-CM | POA: Diagnosis not present

## 2020-03-09 DIAGNOSIS — I10 Essential (primary) hypertension: Secondary | ICD-10-CM | POA: Diagnosis not present

## 2020-03-09 DIAGNOSIS — Z992 Dependence on renal dialysis: Secondary | ICD-10-CM | POA: Diagnosis not present

## 2020-03-09 DIAGNOSIS — Z7952 Long term (current) use of systemic steroids: Secondary | ICD-10-CM | POA: Diagnosis not present

## 2020-03-09 DIAGNOSIS — E1121 Type 2 diabetes mellitus with diabetic nephropathy: Secondary | ICD-10-CM | POA: Diagnosis not present

## 2020-03-09 DIAGNOSIS — Z794 Long term (current) use of insulin: Secondary | ICD-10-CM | POA: Diagnosis not present

## 2020-03-09 DIAGNOSIS — D849 Immunodeficiency, unspecified: Secondary | ICD-10-CM | POA: Diagnosis not present

## 2020-03-09 DIAGNOSIS — Z79899 Other long term (current) drug therapy: Secondary | ICD-10-CM | POA: Diagnosis not present

## 2020-03-09 DIAGNOSIS — Z4822 Encounter for aftercare following kidney transplant: Secondary | ICD-10-CM | POA: Diagnosis not present

## 2020-03-09 DIAGNOSIS — Z940 Kidney transplant status: Secondary | ICD-10-CM | POA: Diagnosis not present

## 2020-03-09 DIAGNOSIS — E785 Hyperlipidemia, unspecified: Secondary | ICD-10-CM | POA: Diagnosis not present

## 2020-03-09 DIAGNOSIS — E1122 Type 2 diabetes mellitus with diabetic chronic kidney disease: Secondary | ICD-10-CM | POA: Diagnosis not present

## 2020-03-09 DIAGNOSIS — I120 Hypertensive chronic kidney disease with stage 5 chronic kidney disease or end stage renal disease: Secondary | ICD-10-CM | POA: Diagnosis not present

## 2020-03-09 DIAGNOSIS — D649 Anemia, unspecified: Secondary | ICD-10-CM | POA: Diagnosis not present

## 2020-03-18 DIAGNOSIS — Z794 Long term (current) use of insulin: Secondary | ICD-10-CM | POA: Diagnosis not present

## 2020-03-18 DIAGNOSIS — I671 Cerebral aneurysm, nonruptured: Secondary | ICD-10-CM | POA: Diagnosis not present

## 2020-03-18 DIAGNOSIS — Z4822 Encounter for aftercare following kidney transplant: Secondary | ICD-10-CM | POA: Diagnosis not present

## 2020-03-18 DIAGNOSIS — D649 Anemia, unspecified: Secondary | ICD-10-CM | POA: Diagnosis not present

## 2020-03-18 DIAGNOSIS — Z7952 Long term (current) use of systemic steroids: Secondary | ICD-10-CM | POA: Diagnosis not present

## 2020-03-18 DIAGNOSIS — Z9889 Other specified postprocedural states: Secondary | ICD-10-CM | POA: Diagnosis not present

## 2020-03-18 DIAGNOSIS — Z5181 Encounter for therapeutic drug level monitoring: Secondary | ICD-10-CM | POA: Diagnosis not present

## 2020-03-18 DIAGNOSIS — Z940 Kidney transplant status: Secondary | ICD-10-CM | POA: Diagnosis not present

## 2020-03-18 DIAGNOSIS — Z8679 Personal history of other diseases of the circulatory system: Secondary | ICD-10-CM | POA: Diagnosis not present

## 2020-03-18 DIAGNOSIS — E785 Hyperlipidemia, unspecified: Secondary | ICD-10-CM | POA: Diagnosis not present

## 2020-03-18 DIAGNOSIS — E119 Type 2 diabetes mellitus without complications: Secondary | ICD-10-CM | POA: Diagnosis not present

## 2020-03-18 DIAGNOSIS — Z792 Long term (current) use of antibiotics: Secondary | ICD-10-CM | POA: Diagnosis not present

## 2020-03-18 DIAGNOSIS — Z79899 Other long term (current) drug therapy: Secondary | ICD-10-CM | POA: Diagnosis not present

## 2020-03-18 DIAGNOSIS — D849 Immunodeficiency, unspecified: Secondary | ICD-10-CM | POA: Diagnosis not present

## 2020-03-18 DIAGNOSIS — E1121 Type 2 diabetes mellitus with diabetic nephropathy: Secondary | ICD-10-CM | POA: Diagnosis not present

## 2020-03-18 DIAGNOSIS — I10 Essential (primary) hypertension: Secondary | ICD-10-CM | POA: Diagnosis not present

## 2020-03-25 DIAGNOSIS — I7090 Unspecified atherosclerosis: Secondary | ICD-10-CM | POA: Diagnosis not present

## 2020-03-25 DIAGNOSIS — Z4822 Encounter for aftercare following kidney transplant: Secondary | ICD-10-CM | POA: Diagnosis not present

## 2020-03-25 DIAGNOSIS — E1122 Type 2 diabetes mellitus with diabetic chronic kidney disease: Secondary | ICD-10-CM | POA: Diagnosis not present

## 2020-03-25 DIAGNOSIS — I10 Essential (primary) hypertension: Secondary | ICD-10-CM | POA: Diagnosis not present

## 2020-03-25 DIAGNOSIS — E785 Hyperlipidemia, unspecified: Secondary | ICD-10-CM | POA: Diagnosis not present

## 2020-03-25 DIAGNOSIS — Z794 Long term (current) use of insulin: Secondary | ICD-10-CM | POA: Diagnosis not present

## 2020-03-25 DIAGNOSIS — D849 Immunodeficiency, unspecified: Secondary | ICD-10-CM | POA: Diagnosis not present

## 2020-03-25 DIAGNOSIS — D649 Anemia, unspecified: Secondary | ICD-10-CM | POA: Diagnosis not present

## 2020-03-25 DIAGNOSIS — Z79899 Other long term (current) drug therapy: Secondary | ICD-10-CM | POA: Diagnosis not present

## 2020-03-25 DIAGNOSIS — N186 End stage renal disease: Secondary | ICD-10-CM | POA: Diagnosis not present

## 2020-03-25 DIAGNOSIS — I120 Hypertensive chronic kidney disease with stage 5 chronic kidney disease or end stage renal disease: Secondary | ICD-10-CM | POA: Diagnosis not present

## 2020-04-01 DIAGNOSIS — Z940 Kidney transplant status: Secondary | ICD-10-CM | POA: Diagnosis not present

## 2020-04-01 DIAGNOSIS — Z8679 Personal history of other diseases of the circulatory system: Secondary | ICD-10-CM | POA: Diagnosis not present

## 2020-04-01 DIAGNOSIS — D649 Anemia, unspecified: Secondary | ICD-10-CM | POA: Diagnosis not present

## 2020-04-01 DIAGNOSIS — E119 Type 2 diabetes mellitus without complications: Secondary | ICD-10-CM | POA: Diagnosis not present

## 2020-04-01 DIAGNOSIS — I10 Essential (primary) hypertension: Secondary | ICD-10-CM | POA: Diagnosis not present

## 2020-04-01 DIAGNOSIS — Z792 Long term (current) use of antibiotics: Secondary | ICD-10-CM | POA: Diagnosis not present

## 2020-04-01 DIAGNOSIS — E1121 Type 2 diabetes mellitus with diabetic nephropathy: Secondary | ICD-10-CM | POA: Diagnosis not present

## 2020-04-01 DIAGNOSIS — J3489 Other specified disorders of nose and nasal sinuses: Secondary | ICD-10-CM | POA: Diagnosis not present

## 2020-04-01 DIAGNOSIS — Z794 Long term (current) use of insulin: Secondary | ICD-10-CM | POA: Diagnosis not present

## 2020-04-01 DIAGNOSIS — Z4822 Encounter for aftercare following kidney transplant: Secondary | ICD-10-CM | POA: Diagnosis not present

## 2020-04-01 DIAGNOSIS — Z5181 Encounter for therapeutic drug level monitoring: Secondary | ICD-10-CM | POA: Diagnosis not present

## 2020-04-01 DIAGNOSIS — I671 Cerebral aneurysm, nonruptured: Secondary | ICD-10-CM | POA: Diagnosis not present

## 2020-04-01 DIAGNOSIS — Z79899 Other long term (current) drug therapy: Secondary | ICD-10-CM | POA: Diagnosis not present

## 2020-04-01 DIAGNOSIS — Z7952 Long term (current) use of systemic steroids: Secondary | ICD-10-CM | POA: Diagnosis not present

## 2020-04-01 DIAGNOSIS — E785 Hyperlipidemia, unspecified: Secondary | ICD-10-CM | POA: Diagnosis not present

## 2020-04-15 DIAGNOSIS — R5383 Other fatigue: Secondary | ICD-10-CM | POA: Diagnosis not present

## 2020-04-15 DIAGNOSIS — Z4822 Encounter for aftercare following kidney transplant: Secondary | ICD-10-CM | POA: Diagnosis not present

## 2020-04-15 DIAGNOSIS — R635 Abnormal weight gain: Secondary | ICD-10-CM | POA: Diagnosis not present

## 2020-04-15 DIAGNOSIS — R1013 Epigastric pain: Secondary | ICD-10-CM | POA: Diagnosis not present

## 2020-04-15 DIAGNOSIS — Z7952 Long term (current) use of systemic steroids: Secondary | ICD-10-CM | POA: Diagnosis not present

## 2020-04-15 DIAGNOSIS — Z792 Long term (current) use of antibiotics: Secondary | ICD-10-CM | POA: Diagnosis not present

## 2020-04-15 DIAGNOSIS — Z940 Kidney transplant status: Secondary | ICD-10-CM | POA: Diagnosis not present

## 2020-04-15 DIAGNOSIS — Z794 Long term (current) use of insulin: Secondary | ICD-10-CM | POA: Diagnosis not present

## 2020-04-15 DIAGNOSIS — E119 Type 2 diabetes mellitus without complications: Secondary | ICD-10-CM | POA: Diagnosis not present

## 2020-04-15 DIAGNOSIS — Z79899 Other long term (current) drug therapy: Secondary | ICD-10-CM | POA: Diagnosis not present

## 2020-04-15 DIAGNOSIS — I10 Essential (primary) hypertension: Secondary | ICD-10-CM | POA: Diagnosis not present

## 2020-04-23 DIAGNOSIS — Z794 Long term (current) use of insulin: Secondary | ICD-10-CM | POA: Diagnosis not present

## 2020-04-23 DIAGNOSIS — I10 Essential (primary) hypertension: Secondary | ICD-10-CM | POA: Diagnosis not present

## 2020-04-23 DIAGNOSIS — D649 Anemia, unspecified: Secondary | ICD-10-CM | POA: Diagnosis not present

## 2020-04-23 DIAGNOSIS — E785 Hyperlipidemia, unspecified: Secondary | ICD-10-CM | POA: Diagnosis not present

## 2020-04-23 DIAGNOSIS — Z79899 Other long term (current) drug therapy: Secondary | ICD-10-CM | POA: Diagnosis not present

## 2020-04-23 DIAGNOSIS — E872 Acidosis: Secondary | ICD-10-CM | POA: Diagnosis not present

## 2020-04-23 DIAGNOSIS — Z7952 Long term (current) use of systemic steroids: Secondary | ICD-10-CM | POA: Diagnosis not present

## 2020-04-23 DIAGNOSIS — E1165 Type 2 diabetes mellitus with hyperglycemia: Secondary | ICD-10-CM | POA: Diagnosis not present

## 2020-04-23 DIAGNOSIS — Z4822 Encounter for aftercare following kidney transplant: Secondary | ICD-10-CM | POA: Diagnosis not present

## 2020-04-23 DIAGNOSIS — I7090 Unspecified atherosclerosis: Secondary | ICD-10-CM | POA: Diagnosis not present

## 2020-04-23 DIAGNOSIS — Z940 Kidney transplant status: Secondary | ICD-10-CM | POA: Diagnosis not present

## 2020-04-23 DIAGNOSIS — Z792 Long term (current) use of antibiotics: Secondary | ICD-10-CM | POA: Diagnosis not present

## 2020-04-23 DIAGNOSIS — D849 Immunodeficiency, unspecified: Secondary | ICD-10-CM | POA: Diagnosis not present

## 2020-04-29 DIAGNOSIS — I10 Essential (primary) hypertension: Secondary | ICD-10-CM | POA: Diagnosis not present

## 2020-04-29 DIAGNOSIS — Z794 Long term (current) use of insulin: Secondary | ICD-10-CM | POA: Diagnosis not present

## 2020-04-29 DIAGNOSIS — E1121 Type 2 diabetes mellitus with diabetic nephropathy: Secondary | ICD-10-CM | POA: Diagnosis not present

## 2020-04-29 DIAGNOSIS — Z4822 Encounter for aftercare following kidney transplant: Secondary | ICD-10-CM | POA: Diagnosis not present

## 2020-04-29 DIAGNOSIS — D849 Immunodeficiency, unspecified: Secondary | ICD-10-CM | POA: Diagnosis not present

## 2020-04-29 DIAGNOSIS — Z940 Kidney transplant status: Secondary | ICD-10-CM | POA: Diagnosis not present

## 2020-05-05 DIAGNOSIS — I701 Atherosclerosis of renal artery: Secondary | ICD-10-CM | POA: Diagnosis not present

## 2020-05-05 DIAGNOSIS — Z4822 Encounter for aftercare following kidney transplant: Secondary | ICD-10-CM | POA: Diagnosis not present

## 2020-05-05 DIAGNOSIS — I10 Essential (primary) hypertension: Secondary | ICD-10-CM | POA: Diagnosis not present

## 2020-05-05 DIAGNOSIS — Z794 Long term (current) use of insulin: Secondary | ICD-10-CM | POA: Diagnosis not present

## 2020-05-05 DIAGNOSIS — E1121 Type 2 diabetes mellitus with diabetic nephropathy: Secondary | ICD-10-CM | POA: Diagnosis not present

## 2020-05-05 DIAGNOSIS — Q271 Congenital renal artery stenosis: Secondary | ICD-10-CM | POA: Diagnosis not present

## 2020-05-05 DIAGNOSIS — Z940 Kidney transplant status: Secondary | ICD-10-CM | POA: Diagnosis not present

## 2020-05-06 DIAGNOSIS — E1121 Type 2 diabetes mellitus with diabetic nephropathy: Secondary | ICD-10-CM | POA: Diagnosis not present

## 2020-05-06 DIAGNOSIS — N041 Nephrotic syndrome with focal and segmental glomerular lesions: Secondary | ICD-10-CM | POA: Diagnosis not present

## 2020-05-06 DIAGNOSIS — N159 Renal tubulo-interstitial disease, unspecified: Secondary | ICD-10-CM | POA: Diagnosis not present

## 2020-05-06 DIAGNOSIS — N261 Atrophy of kidney (terminal): Secondary | ICD-10-CM | POA: Diagnosis not present

## 2020-05-06 DIAGNOSIS — I10 Essential (primary) hypertension: Secondary | ICD-10-CM | POA: Diagnosis not present

## 2020-05-06 DIAGNOSIS — Z794 Long term (current) use of insulin: Secondary | ICD-10-CM | POA: Diagnosis not present

## 2020-05-06 DIAGNOSIS — Z4822 Encounter for aftercare following kidney transplant: Secondary | ICD-10-CM | POA: Diagnosis not present

## 2020-05-06 DIAGNOSIS — Z940 Kidney transplant status: Secondary | ICD-10-CM | POA: Diagnosis not present

## 2020-05-13 DIAGNOSIS — I10 Essential (primary) hypertension: Secondary | ICD-10-CM | POA: Diagnosis not present

## 2020-05-13 DIAGNOSIS — E11649 Type 2 diabetes mellitus with hypoglycemia without coma: Secondary | ICD-10-CM | POA: Diagnosis not present

## 2020-05-13 DIAGNOSIS — Z7952 Long term (current) use of systemic steroids: Secondary | ICD-10-CM | POA: Diagnosis not present

## 2020-05-13 DIAGNOSIS — Z8679 Personal history of other diseases of the circulatory system: Secondary | ICD-10-CM | POA: Diagnosis not present

## 2020-05-13 DIAGNOSIS — R0982 Postnasal drip: Secondary | ICD-10-CM | POA: Diagnosis not present

## 2020-05-13 DIAGNOSIS — E119 Type 2 diabetes mellitus without complications: Secondary | ICD-10-CM | POA: Diagnosis not present

## 2020-05-13 DIAGNOSIS — D649 Anemia, unspecified: Secondary | ICD-10-CM | POA: Diagnosis not present

## 2020-05-13 DIAGNOSIS — I151 Hypertension secondary to other renal disorders: Secondary | ICD-10-CM | POA: Diagnosis not present

## 2020-05-13 DIAGNOSIS — Z940 Kidney transplant status: Secondary | ICD-10-CM | POA: Diagnosis not present

## 2020-05-13 DIAGNOSIS — Z792 Long term (current) use of antibiotics: Secondary | ICD-10-CM | POA: Diagnosis not present

## 2020-05-13 DIAGNOSIS — Z4822 Encounter for aftercare following kidney transplant: Secondary | ICD-10-CM | POA: Diagnosis not present

## 2020-05-13 DIAGNOSIS — Z794 Long term (current) use of insulin: Secondary | ICD-10-CM | POA: Diagnosis not present

## 2020-05-13 DIAGNOSIS — E785 Hyperlipidemia, unspecified: Secondary | ICD-10-CM | POA: Diagnosis not present

## 2020-05-13 DIAGNOSIS — Z79899 Other long term (current) drug therapy: Secondary | ICD-10-CM | POA: Diagnosis not present

## 2020-05-13 DIAGNOSIS — I671 Cerebral aneurysm, nonruptured: Secondary | ICD-10-CM | POA: Diagnosis not present

## 2020-05-13 DIAGNOSIS — E872 Acidosis: Secondary | ICD-10-CM | POA: Diagnosis not present

## 2020-05-13 DIAGNOSIS — D849 Immunodeficiency, unspecified: Secondary | ICD-10-CM | POA: Diagnosis not present

## 2020-05-13 DIAGNOSIS — Z9889 Other specified postprocedural states: Secondary | ICD-10-CM | POA: Diagnosis not present

## 2020-05-27 DIAGNOSIS — E119 Type 2 diabetes mellitus without complications: Secondary | ICD-10-CM | POA: Diagnosis not present

## 2020-05-27 DIAGNOSIS — Z794 Long term (current) use of insulin: Secondary | ICD-10-CM | POA: Diagnosis not present

## 2020-05-27 DIAGNOSIS — D649 Anemia, unspecified: Secondary | ICD-10-CM | POA: Diagnosis not present

## 2020-05-27 DIAGNOSIS — Z79899 Other long term (current) drug therapy: Secondary | ICD-10-CM | POA: Diagnosis not present

## 2020-05-27 DIAGNOSIS — I10 Essential (primary) hypertension: Secondary | ICD-10-CM | POA: Diagnosis not present

## 2020-05-27 DIAGNOSIS — R809 Proteinuria, unspecified: Secondary | ICD-10-CM | POA: Diagnosis not present

## 2020-05-27 DIAGNOSIS — E785 Hyperlipidemia, unspecified: Secondary | ICD-10-CM | POA: Diagnosis not present

## 2020-05-27 DIAGNOSIS — R0982 Postnasal drip: Secondary | ICD-10-CM | POA: Diagnosis not present

## 2020-05-27 DIAGNOSIS — E1121 Type 2 diabetes mellitus with diabetic nephropathy: Secondary | ICD-10-CM | POA: Diagnosis not present

## 2020-05-27 DIAGNOSIS — Z4822 Encounter for aftercare following kidney transplant: Secondary | ICD-10-CM | POA: Diagnosis not present

## 2020-06-08 DIAGNOSIS — I10 Essential (primary) hypertension: Secondary | ICD-10-CM | POA: Diagnosis not present

## 2020-06-10 DIAGNOSIS — Z940 Kidney transplant status: Secondary | ICD-10-CM | POA: Diagnosis not present

## 2020-06-10 DIAGNOSIS — Z4822 Encounter for aftercare following kidney transplant: Secondary | ICD-10-CM | POA: Diagnosis not present

## 2020-06-11 DIAGNOSIS — R809 Proteinuria, unspecified: Secondary | ICD-10-CM | POA: Diagnosis not present

## 2020-06-11 DIAGNOSIS — E1165 Type 2 diabetes mellitus with hyperglycemia: Secondary | ICD-10-CM | POA: Diagnosis not present

## 2020-06-11 DIAGNOSIS — D649 Anemia, unspecified: Secondary | ICD-10-CM | POA: Diagnosis not present

## 2020-06-11 DIAGNOSIS — I10 Essential (primary) hypertension: Secondary | ICD-10-CM | POA: Diagnosis not present

## 2020-06-28 DIAGNOSIS — I10 Essential (primary) hypertension: Secondary | ICD-10-CM | POA: Diagnosis not present

## 2020-06-28 DIAGNOSIS — Z794 Long term (current) use of insulin: Secondary | ICD-10-CM | POA: Diagnosis not present

## 2020-06-28 DIAGNOSIS — B259 Cytomegaloviral disease, unspecified: Secondary | ICD-10-CM | POA: Diagnosis not present

## 2020-06-28 DIAGNOSIS — Z79899 Other long term (current) drug therapy: Secondary | ICD-10-CM | POA: Diagnosis not present

## 2020-06-28 DIAGNOSIS — E1121 Type 2 diabetes mellitus with diabetic nephropathy: Secondary | ICD-10-CM | POA: Diagnosis not present

## 2020-06-28 DIAGNOSIS — Z4822 Encounter for aftercare following kidney transplant: Secondary | ICD-10-CM | POA: Diagnosis not present

## 2020-07-13 DIAGNOSIS — Z79899 Other long term (current) drug therapy: Secondary | ICD-10-CM | POA: Diagnosis not present

## 2020-07-13 DIAGNOSIS — Z940 Kidney transplant status: Secondary | ICD-10-CM | POA: Diagnosis not present

## 2020-07-22 DIAGNOSIS — Z940 Kidney transplant status: Secondary | ICD-10-CM | POA: Diagnosis not present

## 2020-07-22 DIAGNOSIS — Z79899 Other long term (current) drug therapy: Secondary | ICD-10-CM | POA: Diagnosis not present

## 2020-07-26 DIAGNOSIS — I671 Cerebral aneurysm, nonruptured: Secondary | ICD-10-CM | POA: Diagnosis not present

## 2020-07-26 DIAGNOSIS — E1121 Type 2 diabetes mellitus with diabetic nephropathy: Secondary | ICD-10-CM | POA: Diagnosis not present

## 2020-07-26 DIAGNOSIS — R0982 Postnasal drip: Secondary | ICD-10-CM | POA: Diagnosis not present

## 2020-07-26 DIAGNOSIS — Z940 Kidney transplant status: Secondary | ICD-10-CM | POA: Diagnosis not present

## 2020-07-26 DIAGNOSIS — D649 Anemia, unspecified: Secondary | ICD-10-CM | POA: Diagnosis not present

## 2020-07-26 DIAGNOSIS — Z8679 Personal history of other diseases of the circulatory system: Secondary | ICD-10-CM | POA: Diagnosis not present

## 2020-07-26 DIAGNOSIS — E119 Type 2 diabetes mellitus without complications: Secondary | ICD-10-CM | POA: Diagnosis not present

## 2020-07-26 DIAGNOSIS — R0602 Shortness of breath: Secondary | ICD-10-CM | POA: Diagnosis not present

## 2020-07-26 DIAGNOSIS — I10 Essential (primary) hypertension: Secondary | ICD-10-CM | POA: Diagnosis not present

## 2020-07-26 DIAGNOSIS — R600 Localized edema: Secondary | ICD-10-CM | POA: Diagnosis not present

## 2020-07-26 DIAGNOSIS — Z79899 Other long term (current) drug therapy: Secondary | ICD-10-CM | POA: Diagnosis not present

## 2020-07-26 DIAGNOSIS — B348 Other viral infections of unspecified site: Secondary | ICD-10-CM | POA: Diagnosis not present

## 2020-07-26 DIAGNOSIS — Z794 Long term (current) use of insulin: Secondary | ICD-10-CM | POA: Diagnosis not present

## 2020-07-26 DIAGNOSIS — Z792 Long term (current) use of antibiotics: Secondary | ICD-10-CM | POA: Diagnosis not present

## 2020-07-26 DIAGNOSIS — Z7952 Long term (current) use of systemic steroids: Secondary | ICD-10-CM | POA: Diagnosis not present

## 2020-07-26 DIAGNOSIS — Z4822 Encounter for aftercare following kidney transplant: Secondary | ICD-10-CM | POA: Diagnosis not present

## 2020-07-27 DIAGNOSIS — E1165 Type 2 diabetes mellitus with hyperglycemia: Secondary | ICD-10-CM | POA: Diagnosis not present

## 2020-07-27 DIAGNOSIS — R809 Proteinuria, unspecified: Secondary | ICD-10-CM | POA: Diagnosis not present

## 2020-07-27 DIAGNOSIS — D649 Anemia, unspecified: Secondary | ICD-10-CM | POA: Diagnosis not present

## 2020-07-27 DIAGNOSIS — I10 Essential (primary) hypertension: Secondary | ICD-10-CM | POA: Diagnosis not present

## 2020-08-10 DIAGNOSIS — E1165 Type 2 diabetes mellitus with hyperglycemia: Secondary | ICD-10-CM | POA: Diagnosis not present

## 2020-08-11 DIAGNOSIS — D849 Immunodeficiency, unspecified: Secondary | ICD-10-CM | POA: Diagnosis not present

## 2020-08-11 DIAGNOSIS — Z940 Kidney transplant status: Secondary | ICD-10-CM | POA: Diagnosis not present

## 2020-08-18 ENCOUNTER — Telehealth: Payer: Self-pay | Admitting: Nutrition

## 2020-08-18 NOTE — Telephone Encounter (Signed)
LVM to call to schedule appointment for pump training

## 2020-08-18 NOTE — Telephone Encounter (Signed)
lvm to call me back to schedule pump training

## 2020-08-19 ENCOUNTER — Telehealth: Payer: Self-pay | Admitting: Dietician

## 2020-08-23 DIAGNOSIS — I10 Essential (primary) hypertension: Secondary | ICD-10-CM | POA: Diagnosis not present

## 2020-08-23 DIAGNOSIS — E119 Type 2 diabetes mellitus without complications: Secondary | ICD-10-CM | POA: Diagnosis not present

## 2020-08-23 DIAGNOSIS — E785 Hyperlipidemia, unspecified: Secondary | ICD-10-CM | POA: Diagnosis not present

## 2020-08-23 DIAGNOSIS — Z79899 Other long term (current) drug therapy: Secondary | ICD-10-CM | POA: Diagnosis not present

## 2020-08-23 DIAGNOSIS — Z8679 Personal history of other diseases of the circulatory system: Secondary | ICD-10-CM | POA: Diagnosis not present

## 2020-08-23 DIAGNOSIS — Z9889 Other specified postprocedural states: Secondary | ICD-10-CM | POA: Diagnosis not present

## 2020-08-23 DIAGNOSIS — J3489 Other specified disorders of nose and nasal sinuses: Secondary | ICD-10-CM | POA: Diagnosis not present

## 2020-08-23 DIAGNOSIS — Z4822 Encounter for aftercare following kidney transplant: Secondary | ICD-10-CM | POA: Diagnosis not present

## 2020-08-23 DIAGNOSIS — Z792 Long term (current) use of antibiotics: Secondary | ICD-10-CM | POA: Diagnosis not present

## 2020-08-23 DIAGNOSIS — Z794 Long term (current) use of insulin: Secondary | ICD-10-CM | POA: Diagnosis not present

## 2020-08-23 DIAGNOSIS — Z862 Personal history of diseases of the blood and blood-forming organs and certain disorders involving the immune mechanism: Secondary | ICD-10-CM | POA: Diagnosis not present

## 2020-08-23 DIAGNOSIS — Z7952 Long term (current) use of systemic steroids: Secondary | ICD-10-CM | POA: Diagnosis not present

## 2020-08-23 DIAGNOSIS — I671 Cerebral aneurysm, nonruptured: Secondary | ICD-10-CM | POA: Diagnosis not present

## 2020-08-24 NOTE — Telephone Encounter (Signed)
Appointment scheduled.

## 2020-08-27 DIAGNOSIS — Z79899 Other long term (current) drug therapy: Secondary | ICD-10-CM | POA: Diagnosis not present

## 2020-08-27 DIAGNOSIS — Z794 Long term (current) use of insulin: Secondary | ICD-10-CM | POA: Diagnosis not present

## 2020-08-27 DIAGNOSIS — Z940 Kidney transplant status: Secondary | ICD-10-CM | POA: Diagnosis not present

## 2020-08-27 DIAGNOSIS — E1121 Type 2 diabetes mellitus with diabetic nephropathy: Secondary | ICD-10-CM | POA: Diagnosis not present

## 2020-08-30 ENCOUNTER — Other Ambulatory Visit: Payer: Self-pay

## 2020-08-30 ENCOUNTER — Encounter: Payer: BC Managed Care – PPO | Attending: Endocrinology | Admitting: Nutrition

## 2020-08-30 DIAGNOSIS — E119 Type 2 diabetes mellitus without complications: Secondary | ICD-10-CM | POA: Insufficient documentation

## 2020-08-31 NOTE — Progress Notes (Signed)
Patient is here to start on her Tandem insulin pump.  No pump orders were received by Dr. Nash Dimmer office.  The office was called and a message was left. She also did not receive any supplies/infusion sets or cartridges.  I did not have any to give her.  Denzil Hughes was called and orders were sent to Dr. Chalmers Cater for supplies for this pump, as well as the Dexcom. She was started on a Dexcom and we discussed the difference between sensor readings and glucose readings.  She reported good understanding of this.  The readings were linked to her pump and phone.  She will give her user name and password for these apps to Dr. Chalmers Cater' office so that they can view her pump and CGM readings on line.  We discussed how this pump delivers insulin and how to give a bolus.  She reported good understanding of this, but did not start her pump, due to no orders, or insulin.  She will call me when her supplies come in.  She had no final questions.

## 2020-08-31 NOTE — Patient Instructions (Signed)
Read over handouts given on pump protocols, and how use this pump.  Call office for training when pump supplies come in.

## 2020-09-07 DIAGNOSIS — E118 Type 2 diabetes mellitus with unspecified complications: Secondary | ICD-10-CM | POA: Diagnosis not present

## 2020-09-07 DIAGNOSIS — E1165 Type 2 diabetes mellitus with hyperglycemia: Secondary | ICD-10-CM | POA: Diagnosis not present

## 2020-09-07 DIAGNOSIS — Z940 Kidney transplant status: Secondary | ICD-10-CM | POA: Diagnosis not present

## 2020-09-07 DIAGNOSIS — E0590 Thyrotoxicosis, unspecified without thyrotoxic crisis or storm: Secondary | ICD-10-CM | POA: Diagnosis not present

## 2020-09-07 DIAGNOSIS — I10 Essential (primary) hypertension: Secondary | ICD-10-CM | POA: Diagnosis not present

## 2020-09-07 DIAGNOSIS — Z794 Long term (current) use of insulin: Secondary | ICD-10-CM | POA: Diagnosis not present

## 2020-10-06 ENCOUNTER — Telehealth: Payer: Self-pay | Admitting: Nutrition

## 2020-10-06 NOTE — Telephone Encounter (Signed)
LVM to call me to schedule pump start

## 2020-10-07 ENCOUNTER — Telehealth: Payer: Self-pay | Admitting: Dietician

## 2020-10-08 DIAGNOSIS — U071 COVID-19: Secondary | ICD-10-CM | POA: Diagnosis not present

## 2020-10-15 DIAGNOSIS — Z940 Kidney transplant status: Secondary | ICD-10-CM | POA: Diagnosis not present

## 2020-10-25 NOTE — Telephone Encounter (Signed)
Appointment scheduled for 11/03/19.  She was told to not take her Allison Durham that morning.  She voiced understanding of this.

## 2020-11-01 DIAGNOSIS — E119 Type 2 diabetes mellitus without complications: Secondary | ICD-10-CM | POA: Diagnosis not present

## 2020-11-01 DIAGNOSIS — E1121 Type 2 diabetes mellitus with diabetic nephropathy: Secondary | ICD-10-CM | POA: Diagnosis not present

## 2020-11-01 DIAGNOSIS — Z79899 Other long term (current) drug therapy: Secondary | ICD-10-CM | POA: Diagnosis not present

## 2020-11-01 DIAGNOSIS — E785 Hyperlipidemia, unspecified: Secondary | ICD-10-CM | POA: Diagnosis not present

## 2020-11-01 DIAGNOSIS — Z9889 Other specified postprocedural states: Secondary | ICD-10-CM | POA: Diagnosis not present

## 2020-11-01 DIAGNOSIS — I10 Essential (primary) hypertension: Secondary | ICD-10-CM | POA: Diagnosis not present

## 2020-11-01 DIAGNOSIS — D849 Immunodeficiency, unspecified: Secondary | ICD-10-CM | POA: Diagnosis not present

## 2020-11-01 DIAGNOSIS — Z4822 Encounter for aftercare following kidney transplant: Secondary | ICD-10-CM | POA: Diagnosis not present

## 2020-11-01 DIAGNOSIS — E872 Acidosis: Secondary | ICD-10-CM | POA: Diagnosis not present

## 2020-11-01 DIAGNOSIS — Z940 Kidney transplant status: Secondary | ICD-10-CM | POA: Diagnosis not present

## 2020-11-01 DIAGNOSIS — D649 Anemia, unspecified: Secondary | ICD-10-CM | POA: Diagnosis not present

## 2020-11-01 DIAGNOSIS — Z794 Long term (current) use of insulin: Secondary | ICD-10-CM | POA: Diagnosis not present

## 2020-11-01 DIAGNOSIS — Z8616 Personal history of COVID-19: Secondary | ICD-10-CM | POA: Diagnosis not present

## 2020-11-02 ENCOUNTER — Encounter: Payer: BC Managed Care – PPO | Attending: Family Medicine | Admitting: Nutrition

## 2020-11-02 ENCOUNTER — Other Ambulatory Visit: Payer: Self-pay

## 2020-11-02 DIAGNOSIS — E119 Type 2 diabetes mellitus without complications: Secondary | ICD-10-CM | POA: Insufficient documentation

## 2020-11-03 ENCOUNTER — Telehealth: Payer: Self-pay | Admitting: Nutrition

## 2020-11-03 NOTE — Patient Instructions (Signed)
Read over handouts given as well as manual, on both the pump and sensor. Call pump and Dexcom helpline if questions.

## 2020-11-03 NOTE — Progress Notes (Signed)
Gayna was trained on how to use the Tandem insulin pump.  She did not take her long acting insulin this AM, so she started her pump at 9AM.  Settings were put in by the patient, per Dr. Kerney Elbe orders:  Basal rate: 0.8u/hr, ISF: 50 I/C:20, target 120.    Timing 5 hr.  She was also trained on how to use the Dexcom system.  She inserted a sensor into her left abdomen and linked this to her pump.  She filled a cartridge with Novolog insulin, and attached a 6 mm soft set infusion into her right abdomen without difficulty.  She was shown how to give a bolus and we reviewed carb counting, because she said that she was not familiar with this.  Handouts given on all foods containing carbohydrates in 15 gram portion sizes, as well as encouraging her to download the app: Calorie [employer], for other foods not on my list, and fast food restaurants.  She reports that she was not aware that milk products, as well as several other things were carbohydrate foods, and says that this might explain why her blood sugars remain high.  Stressed need to bolus for all foods containing carbs, except when treating lows, and she reported good understanding of this.   Handouts also given on how to fill a cartridge, give a bolus, change sensors, change infusion sets, change pump settings and view status screen, and use acitivity settings.  We reviewed all of these as well as high blood sugar protocol, and sick day guidelines.  She was given hand outs on all of the above as well as emergency supplies to carry and the number to call if questions on how to use this pump.  She was strongly encouraged to review all handouts given and to call me if questions. She worked last night, and agreed to call me tonight before going into work.  She had no final questions, and signed the checklist as understanding all topics.  We scheduled another appointment for 1 week to review all of the above.

## 2020-11-03 NOTE — Telephone Encounter (Signed)
LVM to call me to let me know how she did last night.  Telephone number given.

## 2020-11-03 NOTE — Telephone Encounter (Signed)
Patient reports that she has had no difficulties giving the boluses before meals, sleeping with the pump, or wearing it.  Says she has had no low blood sugars, but "for some reason, it is high now"  309.  She says this is the highest reading she has had since starting this. She  had 2.1u of IOB, but she was talked through giving a correction dose, and gave additional 2 units at this time.  She admits that she may not have counted carbs correctly and that she had worked a double shift today.  She will call me if any problems or questions, and I will see her next week to review pump training and carb counting.  She had no  questions for me at this time.

## 2020-11-09 ENCOUNTER — Other Ambulatory Visit: Payer: Self-pay

## 2020-11-09 ENCOUNTER — Encounter: Payer: BC Managed Care – PPO | Attending: Family Medicine | Admitting: Nutrition

## 2020-11-09 DIAGNOSIS — E119 Type 2 diabetes mellitus without complications: Secondary | ICD-10-CM | POA: Insufficient documentation

## 2020-11-10 NOTE — Progress Notes (Signed)
Patient reported no problems with bolusing, wearing the pump, disconnecting from the pump when showering, and how to respond to the alerts and alarms.  She denies any low blood sugars that required treatment with carbohydrate.  We reviewed all topics with this pump: how to bolus, how to change the cartridge, and how to start/stop pump.  She did a cartridge and infusion set change while here, with little assistance from me.  Her pump blood sugar before starting the cartridge change was 91.   We also reviewed carb counting.  Questions were answered about beans and other foods.  She downloaded the [employer] app, with carb amounts ofor all restaurants and food items.  She was shown how to use this app, and reported good understanding of this. She reported that she forgot to check volume of insulin 3 days ago, and ran out of insulin while working. " Blood sugar was high when I got home that morning.  After changing cartridge, blood sugar came down" Handout given on sick day guidelines, emergency kit supplies, sick day rules, causes of high and low blood sugars, site rotation, and selection.  She was encourage to read all and to call if questions.  She was also told to call Dr. Nash Dimmer office for an appointment in 2 weeks.  She agreed to do all of the above.

## 2020-11-10 NOTE — Patient Instructions (Signed)
Review handouts given, as well as the pump manual Call the pump help line if questions. Schedule appointment with Dr. Debbora Presto in 2 weeks.

## 2020-11-25 DIAGNOSIS — Z940 Kidney transplant status: Secondary | ICD-10-CM | POA: Diagnosis not present

## 2020-11-25 DIAGNOSIS — Z713 Dietary counseling and surveillance: Secondary | ICD-10-CM | POA: Diagnosis not present

## 2020-11-25 DIAGNOSIS — Z794 Long term (current) use of insulin: Secondary | ICD-10-CM | POA: Diagnosis not present

## 2020-11-25 DIAGNOSIS — E669 Obesity, unspecified: Secondary | ICD-10-CM | POA: Diagnosis not present

## 2020-11-25 DIAGNOSIS — E119 Type 2 diabetes mellitus without complications: Secondary | ICD-10-CM | POA: Diagnosis not present

## 2020-11-25 DIAGNOSIS — Z6831 Body mass index (BMI) 31.0-31.9, adult: Secondary | ICD-10-CM | POA: Diagnosis not present

## 2020-11-29 DIAGNOSIS — I151 Hypertension secondary to other renal disorders: Secondary | ICD-10-CM | POA: Diagnosis not present

## 2020-11-29 DIAGNOSIS — D649 Anemia, unspecified: Secondary | ICD-10-CM | POA: Diagnosis not present

## 2020-11-29 DIAGNOSIS — I10 Essential (primary) hypertension: Secondary | ICD-10-CM | POA: Diagnosis not present

## 2020-11-29 DIAGNOSIS — E119 Type 2 diabetes mellitus without complications: Secondary | ICD-10-CM | POA: Diagnosis not present

## 2020-11-29 DIAGNOSIS — Z7952 Long term (current) use of systemic steroids: Secondary | ICD-10-CM | POA: Diagnosis not present

## 2020-11-29 DIAGNOSIS — Z8616 Personal history of COVID-19: Secondary | ICD-10-CM | POA: Diagnosis not present

## 2020-11-29 DIAGNOSIS — D849 Immunodeficiency, unspecified: Secondary | ICD-10-CM | POA: Diagnosis not present

## 2020-11-29 DIAGNOSIS — I609 Nontraumatic subarachnoid hemorrhage, unspecified: Secondary | ICD-10-CM | POA: Diagnosis not present

## 2020-11-29 DIAGNOSIS — E785 Hyperlipidemia, unspecified: Secondary | ICD-10-CM | POA: Diagnosis not present

## 2020-11-29 DIAGNOSIS — Z4822 Encounter for aftercare following kidney transplant: Secondary | ICD-10-CM | POA: Diagnosis not present

## 2020-11-29 DIAGNOSIS — Z79899 Other long term (current) drug therapy: Secondary | ICD-10-CM | POA: Diagnosis not present

## 2020-11-29 DIAGNOSIS — Z794 Long term (current) use of insulin: Secondary | ICD-10-CM | POA: Diagnosis not present

## 2020-11-29 DIAGNOSIS — Z792 Long term (current) use of antibiotics: Secondary | ICD-10-CM | POA: Diagnosis not present

## 2020-11-29 DIAGNOSIS — I671 Cerebral aneurysm, nonruptured: Secondary | ICD-10-CM | POA: Diagnosis not present

## 2020-11-29 DIAGNOSIS — Z940 Kidney transplant status: Secondary | ICD-10-CM | POA: Diagnosis not present

## 2020-12-09 DIAGNOSIS — Z6831 Body mass index (BMI) 31.0-31.9, adult: Secondary | ICD-10-CM | POA: Diagnosis not present

## 2020-12-09 DIAGNOSIS — Z01419 Encounter for gynecological examination (general) (routine) without abnormal findings: Secondary | ICD-10-CM | POA: Diagnosis not present

## 2020-12-09 DIAGNOSIS — Z1231 Encounter for screening mammogram for malignant neoplasm of breast: Secondary | ICD-10-CM | POA: Diagnosis not present

## 2020-12-13 ENCOUNTER — Telehealth: Payer: Self-pay | Admitting: Nutrition

## 2020-12-13 NOTE — Telephone Encounter (Signed)
Message left on machine needing an appointment, because is having pump problems.  Appointment scheduled for Wednesday.

## 2020-12-15 ENCOUNTER — Telehealth: Payer: Self-pay | Admitting: Nutrition

## 2020-12-15 ENCOUNTER — Encounter: Payer: BC Managed Care – PPO | Admitting: Nutrition

## 2020-12-20 DIAGNOSIS — Z794 Long term (current) use of insulin: Secondary | ICD-10-CM | POA: Diagnosis not present

## 2020-12-20 DIAGNOSIS — E118 Type 2 diabetes mellitus with unspecified complications: Secondary | ICD-10-CM | POA: Diagnosis not present

## 2020-12-20 DIAGNOSIS — E1165 Type 2 diabetes mellitus with hyperglycemia: Secondary | ICD-10-CM | POA: Diagnosis not present

## 2020-12-21 ENCOUNTER — Other Ambulatory Visit: Payer: Self-pay

## 2020-12-21 ENCOUNTER — Encounter: Payer: BC Managed Care – PPO | Attending: Family Medicine | Admitting: Nutrition

## 2020-12-21 DIAGNOSIS — E119 Type 2 diabetes mellitus without complications: Secondary | ICD-10-CM | POA: Insufficient documentation

## 2020-12-21 NOTE — Progress Notes (Signed)
Patient put in a new transmitter, but did not enter the code/transmitter number into the pump.  The pump would not read the sensor/blood sugars.  She thought that the pump would not work any more, and took it off for 1 week.  During this time, she was taking only Novolog insulin, and blood sugars were "running pretty high".   I explained to her: 1.  The pump will still work without the sensor, and she was shown how to enter the blood sugar reading into the pump when she is bolusing and to do correction boluses.  She was warned that without the sensor, the pump would not prevent low blood sugars, or give extra insulin when blood sugars are low.  She reported good understanding of this.   2.  Also discussed that if she goes off her pump, she will also require a long acting insulin, like she was on before starting the pump.   3.  Explained the need to put the new transmitter number of the transmitter into the pump, every 3 months when she gets a new transmitter, so that the pump can read that transmitter.    She filled a cartridge and attached the infusion set, and started her pump at 8:30AM.  She did a correction dose at that time, because blood sugar was 239.   She had no final questions.

## 2020-12-21 NOTE — Patient Instructions (Signed)
Put new transmitter number into pump with every new transmitter

## 2020-12-22 NOTE — Telephone Encounter (Signed)
Opened in error

## 2020-12-29 DIAGNOSIS — I10 Essential (primary) hypertension: Secondary | ICD-10-CM | POA: Diagnosis not present

## 2020-12-29 DIAGNOSIS — E1165 Type 2 diabetes mellitus with hyperglycemia: Secondary | ICD-10-CM | POA: Diagnosis not present

## 2020-12-29 DIAGNOSIS — Z940 Kidney transplant status: Secondary | ICD-10-CM | POA: Diagnosis not present

## 2021-01-05 DIAGNOSIS — H401132 Primary open-angle glaucoma, bilateral, moderate stage: Secondary | ICD-10-CM | POA: Diagnosis not present

## 2021-01-05 DIAGNOSIS — E119 Type 2 diabetes mellitus without complications: Secondary | ICD-10-CM | POA: Diagnosis not present

## 2021-01-19 DIAGNOSIS — R519 Headache, unspecified: Secondary | ICD-10-CM | POA: Diagnosis not present

## 2021-01-19 DIAGNOSIS — R059 Cough, unspecified: Secondary | ICD-10-CM | POA: Diagnosis not present

## 2021-01-19 DIAGNOSIS — R0981 Nasal congestion: Secondary | ICD-10-CM | POA: Diagnosis not present

## 2021-01-19 DIAGNOSIS — Z20822 Contact with and (suspected) exposure to covid-19: Secondary | ICD-10-CM | POA: Diagnosis not present

## 2021-01-19 DIAGNOSIS — J0110 Acute frontal sinusitis, unspecified: Secondary | ICD-10-CM | POA: Diagnosis not present

## 2021-01-24 DIAGNOSIS — Z8679 Personal history of other diseases of the circulatory system: Secondary | ICD-10-CM | POA: Diagnosis not present

## 2021-01-24 DIAGNOSIS — Z794 Long term (current) use of insulin: Secondary | ICD-10-CM | POA: Diagnosis not present

## 2021-01-24 DIAGNOSIS — Z9641 Presence of insulin pump (external) (internal): Secondary | ICD-10-CM | POA: Diagnosis not present

## 2021-01-24 DIAGNOSIS — Z9889 Other specified postprocedural states: Secondary | ICD-10-CM | POA: Diagnosis not present

## 2021-01-24 DIAGNOSIS — E785 Hyperlipidemia, unspecified: Secondary | ICD-10-CM | POA: Diagnosis not present

## 2021-01-24 DIAGNOSIS — Z4822 Encounter for aftercare following kidney transplant: Secondary | ICD-10-CM | POA: Diagnosis not present

## 2021-01-24 DIAGNOSIS — Z792 Long term (current) use of antibiotics: Secondary | ICD-10-CM | POA: Diagnosis not present

## 2021-01-24 DIAGNOSIS — E119 Type 2 diabetes mellitus without complications: Secondary | ICD-10-CM | POA: Diagnosis not present

## 2021-01-24 DIAGNOSIS — D849 Immunodeficiency, unspecified: Secondary | ICD-10-CM | POA: Diagnosis not present

## 2021-01-24 DIAGNOSIS — Z7952 Long term (current) use of systemic steroids: Secondary | ICD-10-CM | POA: Diagnosis not present

## 2021-01-24 DIAGNOSIS — Z8619 Personal history of other infectious and parasitic diseases: Secondary | ICD-10-CM | POA: Diagnosis not present

## 2021-01-24 DIAGNOSIS — D649 Anemia, unspecified: Secondary | ICD-10-CM | POA: Diagnosis not present

## 2021-01-24 DIAGNOSIS — Z8616 Personal history of COVID-19: Secondary | ICD-10-CM | POA: Diagnosis not present

## 2021-01-24 DIAGNOSIS — I10 Essential (primary) hypertension: Secondary | ICD-10-CM | POA: Diagnosis not present

## 2021-01-24 DIAGNOSIS — Z940 Kidney transplant status: Secondary | ICD-10-CM | POA: Diagnosis not present

## 2021-01-24 DIAGNOSIS — I671 Cerebral aneurysm, nonruptured: Secondary | ICD-10-CM | POA: Diagnosis not present

## 2021-01-24 DIAGNOSIS — E1121 Type 2 diabetes mellitus with diabetic nephropathy: Secondary | ICD-10-CM | POA: Diagnosis not present

## 2021-01-24 DIAGNOSIS — Z79899 Other long term (current) drug therapy: Secondary | ICD-10-CM | POA: Diagnosis not present

## 2021-01-26 DIAGNOSIS — H401132 Primary open-angle glaucoma, bilateral, moderate stage: Secondary | ICD-10-CM | POA: Diagnosis not present

## 2021-02-09 DIAGNOSIS — Z20822 Contact with and (suspected) exposure to covid-19: Secondary | ICD-10-CM | POA: Diagnosis not present

## 2021-02-09 DIAGNOSIS — R519 Headache, unspecified: Secondary | ICD-10-CM | POA: Diagnosis not present

## 2021-02-09 DIAGNOSIS — R0981 Nasal congestion: Secondary | ICD-10-CM | POA: Diagnosis not present

## 2021-02-09 DIAGNOSIS — J0100 Acute maxillary sinusitis, unspecified: Secondary | ICD-10-CM | POA: Diagnosis not present

## 2021-02-09 DIAGNOSIS — R509 Fever, unspecified: Secondary | ICD-10-CM | POA: Diagnosis not present

## 2021-02-21 DIAGNOSIS — E785 Hyperlipidemia, unspecified: Secondary | ICD-10-CM | POA: Diagnosis not present

## 2021-02-21 DIAGNOSIS — D849 Immunodeficiency, unspecified: Secondary | ICD-10-CM | POA: Diagnosis not present

## 2021-02-21 DIAGNOSIS — Z79899 Other long term (current) drug therapy: Secondary | ICD-10-CM | POA: Diagnosis not present

## 2021-02-21 DIAGNOSIS — I6032 Nontraumatic subarachnoid hemorrhage from left posterior communicating artery: Secondary | ICD-10-CM | POA: Diagnosis not present

## 2021-02-21 DIAGNOSIS — Z940 Kidney transplant status: Secondary | ICD-10-CM | POA: Diagnosis not present

## 2021-02-21 DIAGNOSIS — Z794 Long term (current) use of insulin: Secondary | ICD-10-CM | POA: Diagnosis not present

## 2021-02-21 DIAGNOSIS — Z8616 Personal history of COVID-19: Secondary | ICD-10-CM | POA: Diagnosis not present

## 2021-02-21 DIAGNOSIS — I120 Hypertensive chronic kidney disease with stage 5 chronic kidney disease or end stage renal disease: Secondary | ICD-10-CM | POA: Diagnosis not present

## 2021-02-21 DIAGNOSIS — E1122 Type 2 diabetes mellitus with diabetic chronic kidney disease: Secondary | ICD-10-CM | POA: Diagnosis not present

## 2021-02-21 DIAGNOSIS — Z4822 Encounter for aftercare following kidney transplant: Secondary | ICD-10-CM | POA: Diagnosis not present

## 2021-02-21 DIAGNOSIS — D649 Anemia, unspecified: Secondary | ICD-10-CM | POA: Diagnosis not present

## 2021-02-21 DIAGNOSIS — Z9641 Presence of insulin pump (external) (internal): Secondary | ICD-10-CM | POA: Diagnosis not present

## 2021-02-21 DIAGNOSIS — Z792 Long term (current) use of antibiotics: Secondary | ICD-10-CM | POA: Diagnosis not present

## 2021-02-21 DIAGNOSIS — E1121 Type 2 diabetes mellitus with diabetic nephropathy: Secondary | ICD-10-CM | POA: Diagnosis not present

## 2021-02-21 DIAGNOSIS — N186 End stage renal disease: Secondary | ICD-10-CM | POA: Diagnosis not present

## 2021-02-21 DIAGNOSIS — D72819 Decreased white blood cell count, unspecified: Secondary | ICD-10-CM | POA: Diagnosis not present

## 2021-02-21 DIAGNOSIS — I10 Essential (primary) hypertension: Secondary | ICD-10-CM | POA: Diagnosis not present

## 2021-02-21 DIAGNOSIS — Z7952 Long term (current) use of systemic steroids: Secondary | ICD-10-CM | POA: Diagnosis not present

## 2021-03-07 DIAGNOSIS — H1131 Conjunctival hemorrhage, right eye: Secondary | ICD-10-CM | POA: Diagnosis not present

## 2021-03-21 DIAGNOSIS — Z9641 Presence of insulin pump (external) (internal): Secondary | ICD-10-CM | POA: Diagnosis not present

## 2021-03-21 DIAGNOSIS — Z79899 Other long term (current) drug therapy: Secondary | ICD-10-CM | POA: Diagnosis not present

## 2021-03-21 DIAGNOSIS — D72819 Decreased white blood cell count, unspecified: Secondary | ICD-10-CM | POA: Diagnosis not present

## 2021-03-21 DIAGNOSIS — Z4822 Encounter for aftercare following kidney transplant: Secondary | ICD-10-CM | POA: Diagnosis not present

## 2021-03-21 DIAGNOSIS — E119 Type 2 diabetes mellitus without complications: Secondary | ICD-10-CM | POA: Diagnosis not present

## 2021-03-21 DIAGNOSIS — D649 Anemia, unspecified: Secondary | ICD-10-CM | POA: Diagnosis not present

## 2021-03-21 DIAGNOSIS — D849 Immunodeficiency, unspecified: Secondary | ICD-10-CM | POA: Diagnosis not present

## 2021-03-21 DIAGNOSIS — I10 Essential (primary) hypertension: Secondary | ICD-10-CM | POA: Diagnosis not present

## 2021-03-21 DIAGNOSIS — Z7952 Long term (current) use of systemic steroids: Secondary | ICD-10-CM | POA: Diagnosis not present

## 2021-03-21 DIAGNOSIS — Z792 Long term (current) use of antibiotics: Secondary | ICD-10-CM | POA: Diagnosis not present

## 2021-03-23 DIAGNOSIS — E1165 Type 2 diabetes mellitus with hyperglycemia: Secondary | ICD-10-CM | POA: Diagnosis not present

## 2021-03-23 DIAGNOSIS — D649 Anemia, unspecified: Secondary | ICD-10-CM | POA: Diagnosis not present

## 2021-03-23 DIAGNOSIS — R809 Proteinuria, unspecified: Secondary | ICD-10-CM | POA: Diagnosis not present

## 2021-03-23 DIAGNOSIS — I10 Essential (primary) hypertension: Secondary | ICD-10-CM | POA: Diagnosis not present

## 2021-04-13 DIAGNOSIS — E1136 Type 2 diabetes mellitus with diabetic cataract: Secondary | ICD-10-CM | POA: Diagnosis not present

## 2021-04-14 DIAGNOSIS — Z794 Long term (current) use of insulin: Secondary | ICD-10-CM | POA: Diagnosis not present

## 2021-04-14 DIAGNOSIS — E1165 Type 2 diabetes mellitus with hyperglycemia: Secondary | ICD-10-CM | POA: Diagnosis not present

## 2021-04-14 DIAGNOSIS — E118 Type 2 diabetes mellitus with unspecified complications: Secondary | ICD-10-CM | POA: Diagnosis not present

## 2021-04-25 DIAGNOSIS — I10 Essential (primary) hypertension: Secondary | ICD-10-CM | POA: Diagnosis not present

## 2021-04-25 DIAGNOSIS — E1165 Type 2 diabetes mellitus with hyperglycemia: Secondary | ICD-10-CM | POA: Diagnosis not present

## 2021-06-01 DIAGNOSIS — E113591 Type 2 diabetes mellitus with proliferative diabetic retinopathy without macular edema, right eye: Secondary | ICD-10-CM | POA: Diagnosis not present

## 2021-06-01 DIAGNOSIS — E113511 Type 2 diabetes mellitus with proliferative diabetic retinopathy with macular edema, right eye: Secondary | ICD-10-CM | POA: Diagnosis not present

## 2021-06-15 DIAGNOSIS — N189 Chronic kidney disease, unspecified: Secondary | ICD-10-CM | POA: Diagnosis not present

## 2021-06-15 DIAGNOSIS — Z940 Kidney transplant status: Secondary | ICD-10-CM | POA: Diagnosis not present

## 2021-06-15 DIAGNOSIS — D631 Anemia in chronic kidney disease: Secondary | ICD-10-CM | POA: Diagnosis not present

## 2021-06-15 DIAGNOSIS — I129 Hypertensive chronic kidney disease with stage 1 through stage 4 chronic kidney disease, or unspecified chronic kidney disease: Secondary | ICD-10-CM | POA: Diagnosis not present

## 2021-07-06 DIAGNOSIS — Z794 Long term (current) use of insulin: Secondary | ICD-10-CM | POA: Diagnosis not present

## 2021-07-06 DIAGNOSIS — E118 Type 2 diabetes mellitus with unspecified complications: Secondary | ICD-10-CM | POA: Diagnosis not present

## 2021-07-06 DIAGNOSIS — E1165 Type 2 diabetes mellitus with hyperglycemia: Secondary | ICD-10-CM | POA: Diagnosis not present

## 2021-07-13 DIAGNOSIS — E113513 Type 2 diabetes mellitus with proliferative diabetic retinopathy with macular edema, bilateral: Secondary | ICD-10-CM | POA: Diagnosis not present

## 2021-07-13 DIAGNOSIS — E113521 Type 2 diabetes mellitus with proliferative diabetic retinopathy with traction retinal detachment involving the macula, right eye: Secondary | ICD-10-CM | POA: Diagnosis not present

## 2021-07-13 DIAGNOSIS — E113511 Type 2 diabetes mellitus with proliferative diabetic retinopathy with macular edema, right eye: Secondary | ICD-10-CM | POA: Diagnosis not present

## 2021-07-19 DIAGNOSIS — H3341 Traction detachment of retina, right eye: Secondary | ICD-10-CM | POA: Diagnosis not present

## 2021-07-19 DIAGNOSIS — H33051 Total retinal detachment, right eye: Secondary | ICD-10-CM | POA: Diagnosis not present

## 2021-07-25 DIAGNOSIS — Z940 Kidney transplant status: Secondary | ICD-10-CM | POA: Diagnosis not present

## 2021-07-25 DIAGNOSIS — E1121 Type 2 diabetes mellitus with diabetic nephropathy: Secondary | ICD-10-CM | POA: Diagnosis not present

## 2021-07-27 DIAGNOSIS — E113521 Type 2 diabetes mellitus with proliferative diabetic retinopathy with traction retinal detachment involving the macula, right eye: Secondary | ICD-10-CM | POA: Diagnosis not present

## 2021-08-09 DIAGNOSIS — Z940 Kidney transplant status: Secondary | ICD-10-CM | POA: Diagnosis not present

## 2021-08-13 DIAGNOSIS — E118 Type 2 diabetes mellitus with unspecified complications: Secondary | ICD-10-CM | POA: Diagnosis not present

## 2021-08-13 DIAGNOSIS — Z794 Long term (current) use of insulin: Secondary | ICD-10-CM | POA: Diagnosis not present

## 2021-08-13 DIAGNOSIS — E1165 Type 2 diabetes mellitus with hyperglycemia: Secondary | ICD-10-CM | POA: Diagnosis not present

## 2021-08-24 DIAGNOSIS — E113521 Type 2 diabetes mellitus with proliferative diabetic retinopathy with traction retinal detachment involving the macula, right eye: Secondary | ICD-10-CM | POA: Diagnosis not present

## 2021-09-26 DIAGNOSIS — Z940 Kidney transplant status: Secondary | ICD-10-CM | POA: Diagnosis not present

## 2021-10-18 DIAGNOSIS — I129 Hypertensive chronic kidney disease with stage 1 through stage 4 chronic kidney disease, or unspecified chronic kidney disease: Secondary | ICD-10-CM | POA: Diagnosis not present

## 2021-10-18 DIAGNOSIS — N189 Chronic kidney disease, unspecified: Secondary | ICD-10-CM | POA: Diagnosis not present

## 2021-10-18 DIAGNOSIS — D631 Anemia in chronic kidney disease: Secondary | ICD-10-CM | POA: Diagnosis not present

## 2021-10-18 DIAGNOSIS — Z940 Kidney transplant status: Secondary | ICD-10-CM | POA: Diagnosis not present

## 2021-10-26 DIAGNOSIS — E113512 Type 2 diabetes mellitus with proliferative diabetic retinopathy with macular edema, left eye: Secondary | ICD-10-CM | POA: Diagnosis not present

## 2021-11-01 DIAGNOSIS — Z794 Long term (current) use of insulin: Secondary | ICD-10-CM | POA: Diagnosis not present

## 2021-11-01 DIAGNOSIS — E1165 Type 2 diabetes mellitus with hyperglycemia: Secondary | ICD-10-CM | POA: Diagnosis not present

## 2021-11-01 DIAGNOSIS — E118 Type 2 diabetes mellitus with unspecified complications: Secondary | ICD-10-CM | POA: Diagnosis not present

## 2021-11-04 DIAGNOSIS — Z01818 Encounter for other preprocedural examination: Secondary | ICD-10-CM | POA: Diagnosis not present

## 2021-11-04 DIAGNOSIS — H25811 Combined forms of age-related cataract, right eye: Secondary | ICD-10-CM | POA: Diagnosis not present

## 2021-11-16 DIAGNOSIS — H25811 Combined forms of age-related cataract, right eye: Secondary | ICD-10-CM | POA: Diagnosis not present

## 2021-12-07 DIAGNOSIS — I10 Essential (primary) hypertension: Secondary | ICD-10-CM | POA: Diagnosis not present

## 2021-12-07 DIAGNOSIS — R809 Proteinuria, unspecified: Secondary | ICD-10-CM | POA: Diagnosis not present

## 2021-12-07 DIAGNOSIS — D649 Anemia, unspecified: Secondary | ICD-10-CM | POA: Diagnosis not present

## 2021-12-07 DIAGNOSIS — E1165 Type 2 diabetes mellitus with hyperglycemia: Secondary | ICD-10-CM | POA: Diagnosis not present

## 2021-12-23 DIAGNOSIS — Z940 Kidney transplant status: Secondary | ICD-10-CM | POA: Diagnosis not present

## 2021-12-23 DIAGNOSIS — E1122 Type 2 diabetes mellitus with diabetic chronic kidney disease: Secondary | ICD-10-CM | POA: Diagnosis not present

## 2022-01-05 DIAGNOSIS — T8579XA Infection and inflammatory reaction due to other internal prosthetic devices, implants and grafts, initial encounter: Secondary | ICD-10-CM | POA: Diagnosis not present

## 2022-01-06 ENCOUNTER — Other Ambulatory Visit: Payer: Self-pay | Admitting: Nephrology

## 2022-01-06 DIAGNOSIS — Z940 Kidney transplant status: Secondary | ICD-10-CM

## 2022-01-10 DIAGNOSIS — Z940 Kidney transplant status: Secondary | ICD-10-CM | POA: Diagnosis not present

## 2022-01-10 DIAGNOSIS — Z4822 Encounter for aftercare following kidney transplant: Secondary | ICD-10-CM | POA: Diagnosis not present

## 2022-01-13 DIAGNOSIS — Z940 Kidney transplant status: Secondary | ICD-10-CM | POA: Diagnosis not present

## 2022-01-23 DIAGNOSIS — E113511 Type 2 diabetes mellitus with proliferative diabetic retinopathy with macular edema, right eye: Secondary | ICD-10-CM | POA: Diagnosis not present

## 2022-01-26 DIAGNOSIS — Z794 Long term (current) use of insulin: Secondary | ICD-10-CM | POA: Diagnosis not present

## 2022-01-26 DIAGNOSIS — E1165 Type 2 diabetes mellitus with hyperglycemia: Secondary | ICD-10-CM | POA: Diagnosis not present

## 2022-01-26 DIAGNOSIS — E118 Type 2 diabetes mellitus with unspecified complications: Secondary | ICD-10-CM | POA: Diagnosis not present

## 2022-01-30 DIAGNOSIS — Z792 Long term (current) use of antibiotics: Secondary | ICD-10-CM | POA: Diagnosis not present

## 2022-01-30 DIAGNOSIS — D72819 Decreased white blood cell count, unspecified: Secondary | ICD-10-CM | POA: Diagnosis not present

## 2022-01-30 DIAGNOSIS — D849 Immunodeficiency, unspecified: Secondary | ICD-10-CM | POA: Diagnosis not present

## 2022-01-30 DIAGNOSIS — Z8669 Personal history of other diseases of the nervous system and sense organs: Secondary | ICD-10-CM | POA: Diagnosis not present

## 2022-01-30 DIAGNOSIS — Z79899 Other long term (current) drug therapy: Secondary | ICD-10-CM | POA: Diagnosis not present

## 2022-01-30 DIAGNOSIS — E119 Type 2 diabetes mellitus without complications: Secondary | ICD-10-CM | POA: Diagnosis not present

## 2022-01-30 DIAGNOSIS — E1121 Type 2 diabetes mellitus with diabetic nephropathy: Secondary | ICD-10-CM | POA: Diagnosis not present

## 2022-01-30 DIAGNOSIS — I10 Essential (primary) hypertension: Secondary | ICD-10-CM | POA: Diagnosis not present

## 2022-01-30 DIAGNOSIS — E785 Hyperlipidemia, unspecified: Secondary | ICD-10-CM | POA: Diagnosis not present

## 2022-01-30 DIAGNOSIS — Z4822 Encounter for aftercare following kidney transplant: Secondary | ICD-10-CM | POA: Diagnosis not present

## 2022-01-30 DIAGNOSIS — Z940 Kidney transplant status: Secondary | ICD-10-CM | POA: Diagnosis not present

## 2022-01-30 DIAGNOSIS — Z7952 Long term (current) use of systemic steroids: Secondary | ICD-10-CM | POA: Diagnosis not present

## 2022-01-30 DIAGNOSIS — Z9641 Presence of insulin pump (external) (internal): Secondary | ICD-10-CM | POA: Diagnosis not present

## 2022-01-30 DIAGNOSIS — D649 Anemia, unspecified: Secondary | ICD-10-CM | POA: Diagnosis not present

## 2022-02-01 DIAGNOSIS — E113521 Type 2 diabetes mellitus with proliferative diabetic retinopathy with traction retinal detachment involving the macula, right eye: Secondary | ICD-10-CM | POA: Diagnosis not present

## 2022-02-06 ENCOUNTER — Encounter (HOSPITAL_COMMUNITY): Payer: Self-pay

## 2022-02-06 ENCOUNTER — Emergency Department (HOSPITAL_BASED_OUTPATIENT_CLINIC_OR_DEPARTMENT_OTHER): Payer: BC Managed Care – PPO

## 2022-02-06 ENCOUNTER — Encounter (HOSPITAL_BASED_OUTPATIENT_CLINIC_OR_DEPARTMENT_OTHER): Payer: Self-pay

## 2022-02-06 ENCOUNTER — Emergency Department (HOSPITAL_BASED_OUTPATIENT_CLINIC_OR_DEPARTMENT_OTHER)
Admission: EM | Admit: 2022-02-06 | Discharge: 2022-02-07 | Disposition: A | Discharge status: Home / Self Care | Payer: BC Managed Care – PPO | Attending: Emergency Medicine | Admitting: Emergency Medicine

## 2022-02-06 ENCOUNTER — Other Ambulatory Visit: Payer: Self-pay

## 2022-02-06 DIAGNOSIS — H538 Other visual disturbances: Secondary | ICD-10-CM | POA: Insufficient documentation

## 2022-02-06 DIAGNOSIS — I129 Hypertensive chronic kidney disease with stage 1 through stage 4 chronic kidney disease, or unspecified chronic kidney disease: Secondary | ICD-10-CM | POA: Insufficient documentation

## 2022-02-06 DIAGNOSIS — Z7982 Long term (current) use of aspirin: Secondary | ICD-10-CM | POA: Diagnosis not present

## 2022-02-06 DIAGNOSIS — R519 Headache, unspecified: Secondary | ICD-10-CM | POA: Insufficient documentation

## 2022-02-06 DIAGNOSIS — Z79899 Other long term (current) drug therapy: Secondary | ICD-10-CM | POA: Insufficient documentation

## 2022-02-06 DIAGNOSIS — Z794 Long term (current) use of insulin: Secondary | ICD-10-CM | POA: Diagnosis not present

## 2022-02-06 DIAGNOSIS — N189 Chronic kidney disease, unspecified: Secondary | ICD-10-CM | POA: Diagnosis not present

## 2022-02-06 DIAGNOSIS — H5704 Mydriasis: Secondary | ICD-10-CM | POA: Insufficient documentation

## 2022-02-06 DIAGNOSIS — H53149 Visual discomfort, unspecified: Secondary | ICD-10-CM | POA: Diagnosis not present

## 2022-02-06 DIAGNOSIS — H539 Unspecified visual disturbance: Secondary | ICD-10-CM | POA: Diagnosis not present

## 2022-02-06 DIAGNOSIS — H5711 Ocular pain, right eye: Secondary | ICD-10-CM | POA: Diagnosis not present

## 2022-02-06 DIAGNOSIS — Z940 Kidney transplant status: Secondary | ICD-10-CM | POA: Diagnosis not present

## 2022-02-06 DIAGNOSIS — R258 Other abnormal involuntary movements: Secondary | ICD-10-CM | POA: Diagnosis not present

## 2022-02-06 DIAGNOSIS — G459 Transient cerebral ischemic attack, unspecified: Secondary | ICD-10-CM | POA: Diagnosis not present

## 2022-02-06 DIAGNOSIS — E1122 Type 2 diabetes mellitus with diabetic chronic kidney disease: Secondary | ICD-10-CM | POA: Insufficient documentation

## 2022-02-06 DIAGNOSIS — E1165 Type 2 diabetes mellitus with hyperglycemia: Secondary | ICD-10-CM | POA: Diagnosis not present

## 2022-02-06 DIAGNOSIS — H409 Unspecified glaucoma: Secondary | ICD-10-CM | POA: Diagnosis not present

## 2022-02-06 DIAGNOSIS — R2990 Unspecified symptoms and signs involving the nervous system: Secondary | ICD-10-CM

## 2022-02-06 DIAGNOSIS — R7982 Elevated C-reactive protein (CRP): Secondary | ICD-10-CM | POA: Insufficient documentation

## 2022-02-06 LAB — CBC WITH DIFFERENTIAL/PLATELET
Abs Immature Granulocytes: 0.02 10*3/uL (ref 0.00–0.07)
Basophils Absolute: 0 10*3/uL (ref 0.0–0.1)
Basophils Relative: 0 %
Eosinophils Absolute: 0.1 10*3/uL (ref 0.0–0.5)
Eosinophils Relative: 1 %
HCT: 41.3 % (ref 36.0–46.0)
Hemoglobin: 13.3 g/dL (ref 12.0–15.0)
Immature Granulocytes: 0 %
Lymphocytes Relative: 13 %
Lymphs Abs: 0.7 10*3/uL (ref 0.7–4.0)
MCH: 27.2 pg (ref 26.0–34.0)
MCHC: 32.2 g/dL (ref 30.0–36.0)
MCV: 84.5 fL (ref 80.0–100.0)
Monocytes Absolute: 0.5 10*3/uL (ref 0.1–1.0)
Monocytes Relative: 10 %
Neutro Abs: 4.1 10*3/uL (ref 1.7–7.7)
Neutrophils Relative %: 76 %
Platelets: 265 10*3/uL (ref 150–400)
RBC: 4.89 MIL/uL (ref 3.87–5.11)
RDW: 14.7 % (ref 11.5–15.5)
WBC: 5.4 10*3/uL (ref 4.0–10.5)
nRBC: 0 % (ref 0.0–0.2)

## 2022-02-06 LAB — BASIC METABOLIC PANEL
Anion gap: 10 (ref 5–15)
BUN: 21 mg/dL — ABNORMAL HIGH (ref 6–20)
CO2: 26 mmol/L (ref 22–32)
Calcium: 10.6 mg/dL — ABNORMAL HIGH (ref 8.9–10.3)
Chloride: 104 mmol/L (ref 98–111)
Creatinine, Ser: 1.54 mg/dL — ABNORMAL HIGH (ref 0.44–1.00)
GFR, Estimated: 39 mL/min — ABNORMAL LOW (ref >60)
Glucose, Bld: 128 mg/dL — ABNORMAL HIGH (ref 70–99)
Potassium: 4.1 mmol/L (ref 3.5–5.1)
Sodium: 140 mmol/L (ref 135–145)

## 2022-02-06 IMAGING — CT CT HEAD W/O CM
4 series · 17 of 47 positions shown, 19 images · non-contrast
Comparison: [DATE]

CLINICAL DATA: Severe headache.  Previous cerebral aneurysm.



[Series 2: head wo · axial · 0.46mm/px · z∈[+1295,+1415]mm · 7 of 34 slices shown, 9 images]
[im 5/34  brain]
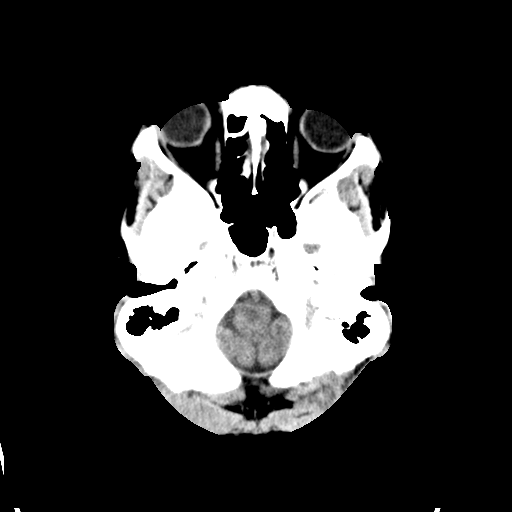
[im 5/34  bone]
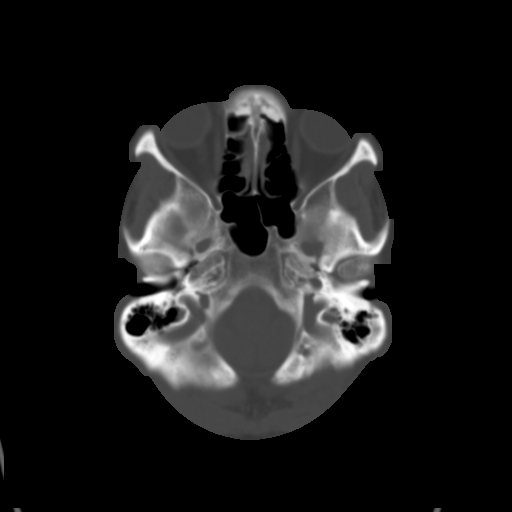
[im 9/34  brain]
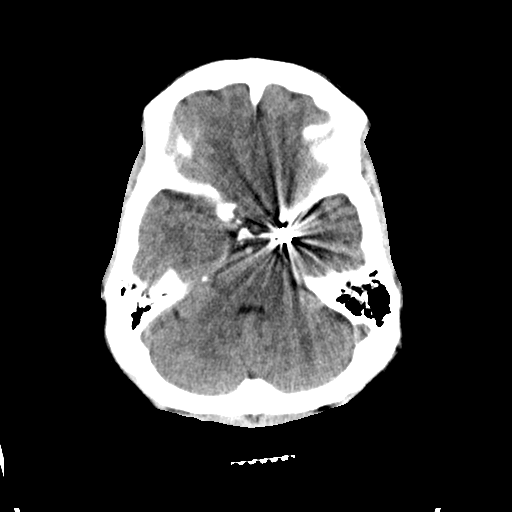
[im 13/34  brain]
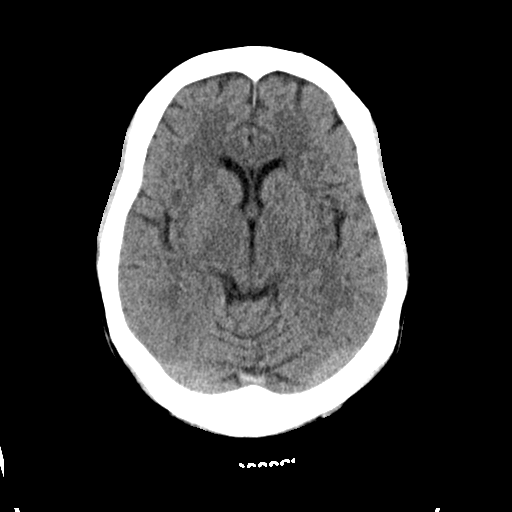
[im 17/34  brain]
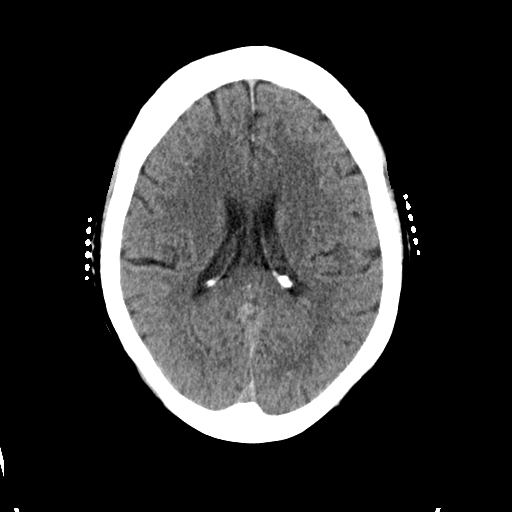
[im 21/34  brain]
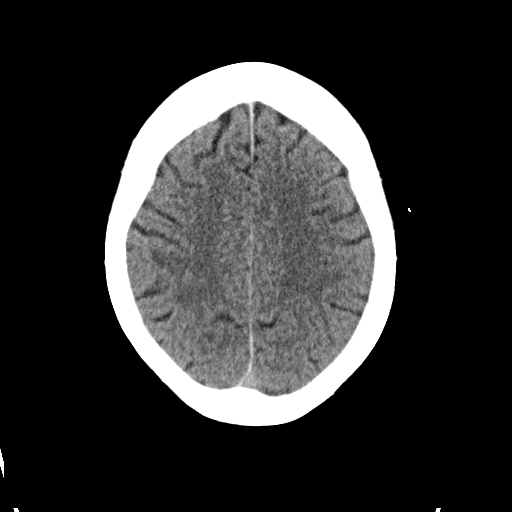
[im 21/34  bone]
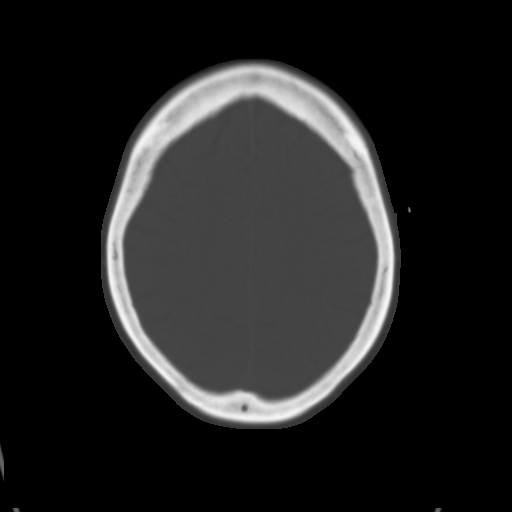
[im 25/34  brain]
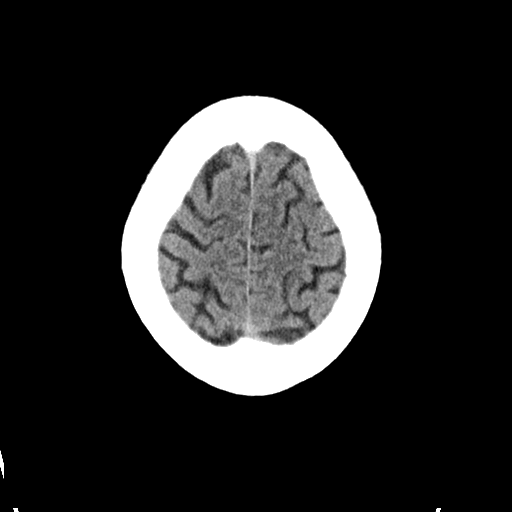
[im 29/34  brain]
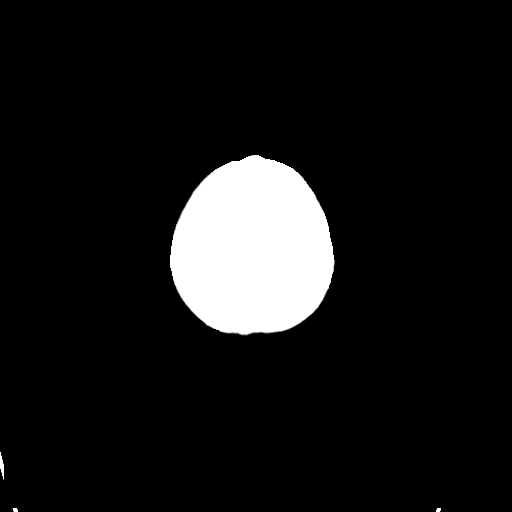

[Series 3: head bone · axial · 0.46mm/px · z∈[+1291,+1347]mm · 4 of 83 slices shown]
[im 9/83  bone]
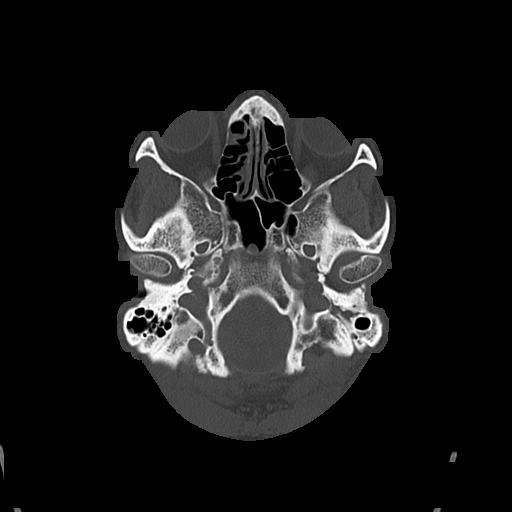
[im 17/83  bone]
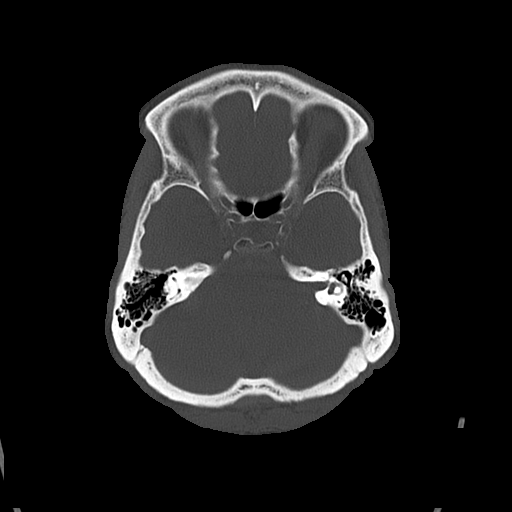
[im 25/83  bone]
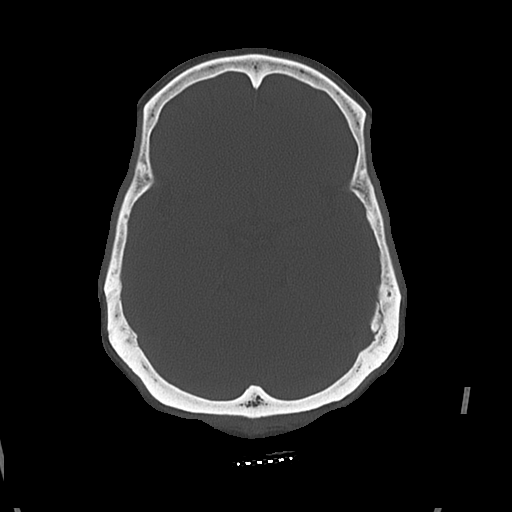
[im 37/83  bone]
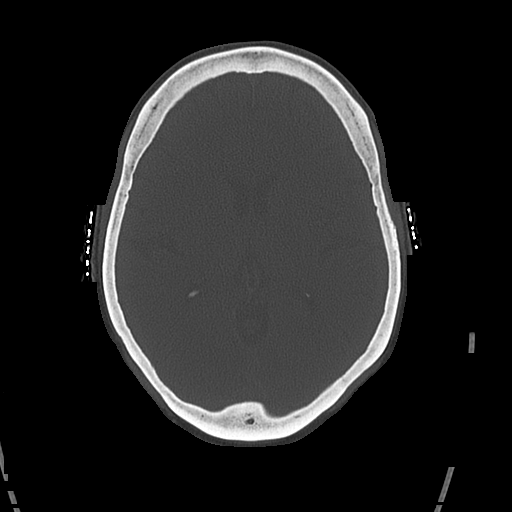

[Series 4: coronal soft · coronal · 0.33mm/px · 3 of 70 slices shown]
[im 27/70  brain]
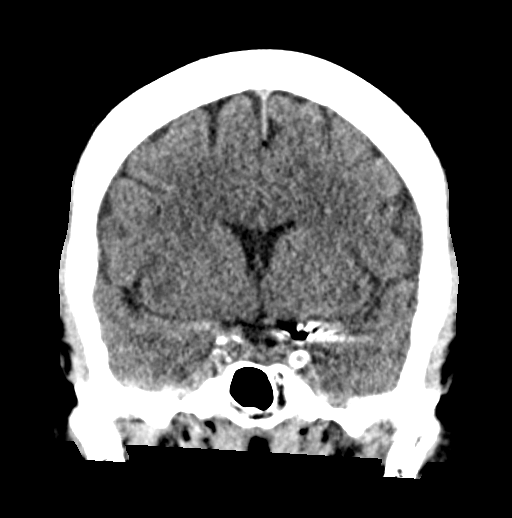
[im 32/70  brain]
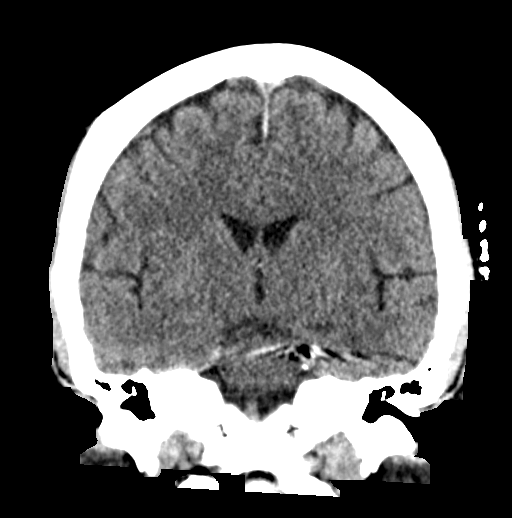
[im 38/70  brain]
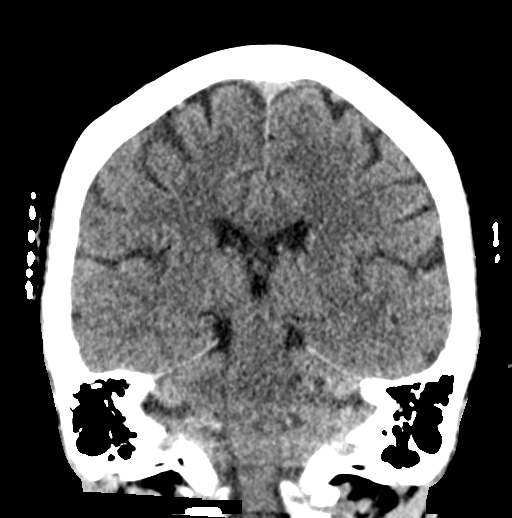

[Series 5: sagittal soft · sagittal · 0.33mm/px · 3 of 57 slices shown]
[im 19/57  brain]
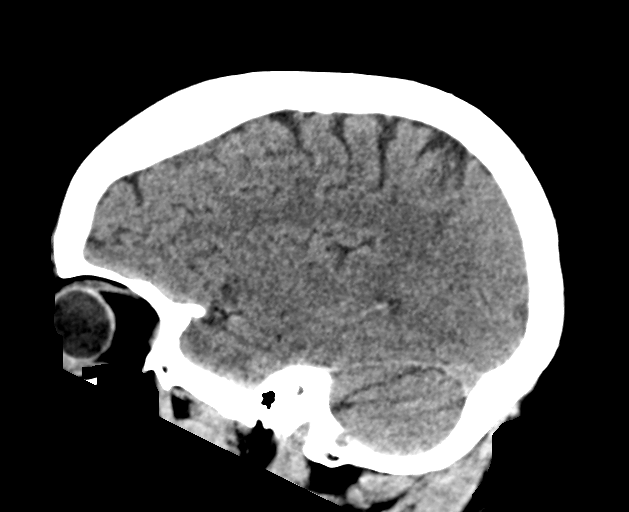
[im 29/57  brain]
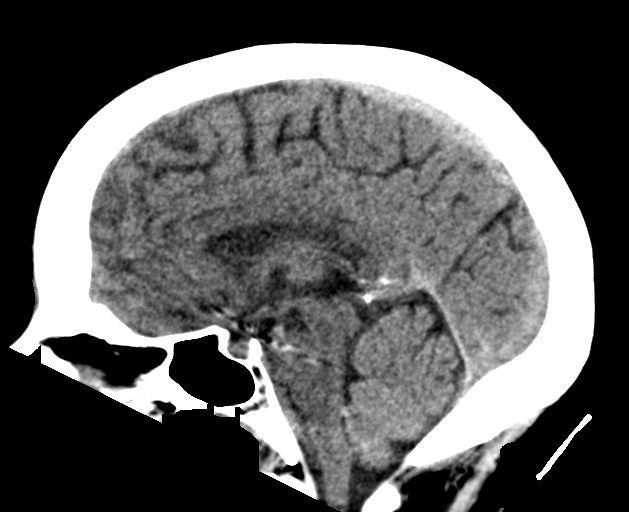
[im 38/57  brain]
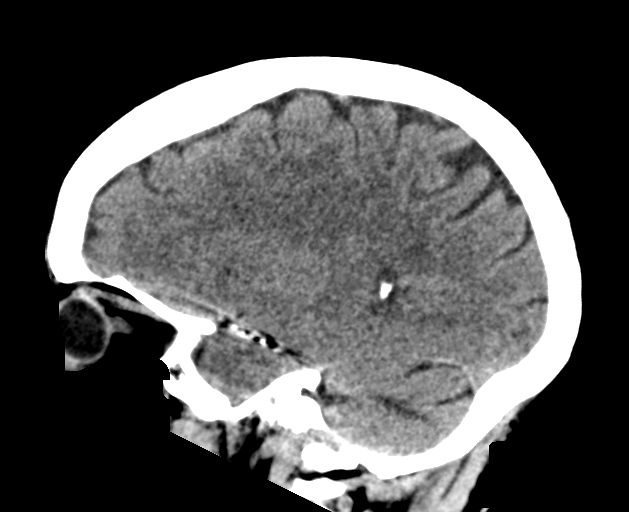

[17 of 47 positions shown; findings below may reference images not displayed]

FINDINGS: Brain: No evidence of intracranial hemorrhage, acute infarction,
hydrocephalus, extra-axial collection, or mass lesion/mass effect.
Aneurysm coil again seen in the left supraclinoid region.

Vascular:  No hyperdense vessel or other acute findings.

Skull: No evidence of fracture or other significant bone
abnormality.

Sinuses/Orbits:  No acute findings.

Other: None.
IMPRESSION: No acute intracranial abnormality.

## 2022-02-06 MED ORDER — METOCLOPRAMIDE HCL 5 MG/ML IJ SOLN
10.0000 mg | Freq: Once | INTRAMUSCULAR | Status: AC
  Administered 2022-02-06: 10 mg via INTRAVENOUS
  Filled 2022-02-06: qty 2

## 2022-02-06 MED ORDER — DIPHENHYDRAMINE HCL 50 MG/ML IJ SOLN
12.5000 mg | Freq: Once | INTRAMUSCULAR | Status: AC
  Administered 2022-02-06: 12.5 mg via INTRAVENOUS
  Filled 2022-02-06: qty 1

## 2022-02-06 MED ORDER — TETRACAINE HCL 0.5 % OP SOLN
2.0000 [drp] | Freq: Once | OPHTHALMIC | Status: DC
  Filled 2022-02-06: qty 4

## 2022-02-06 MED ORDER — SODIUM CHLORIDE 0.9 % IV BOLUS
1000.0000 mL | Freq: Once | INTRAVENOUS | Status: AC
  Administered 2022-02-06: 1000 mL via INTRAVENOUS

## 2022-02-06 NOTE — ED Triage Notes (Signed)
Pt transferred from [place], pt coming for MRI. Hx of cerebral aneurysm, glaucoma, photophobic. No ambulation issues. Cataract sx in march and April.  209/115, HR 90's, 100% RA, NAD. 6/10.

## 2022-02-06 NOTE — ED Triage Notes (Signed)
Patient here POV from Home.  Endorses having Operation to Right Eye in March and April for Glaucoma. Ever since the March Operation she has been having Intermittent Headaches. Patient states however that 5 days PTA she had a Regular Injection to Same Eye and since then she has been having a Constant Headache to Right Head/Face.  No Fevers. No N/V/D.   NAD Noted during Triage. A&Ox4. GCS 15. Ambulatory.

## 2022-02-06 NOTE — ED Notes (Signed)
Introduced self to pt, pt denies any needs at this time, no acute distress noted. Awaiting MRI, POC discussed w/ pt.

## 2022-02-06 NOTE — ED Provider Notes (Signed)
[facility] EMERGENCY DEPT Provider Note   CSN: [id number] Arrival date & time: 02/06/22  1225     History  Chief Complaint  Patient presents with   Headache    Allison Durham is a 56 y.o. female.  Pt is a 56 yo female presenting for right eye pain. Pt states she has surgery for catarax and gluacoma in March/April of this year. States she felt normal after the surgery for 2-3 weeks then developed right sided eye pain, right sided headache, and lacrimation. States she can see dark grey and shadows out of the right eye only. States she went to her opthalmology office at [facility] with Dr. Lyn Records who completed a dilated fundoscopic exam yesterday and expressed concerns for a stroke. Denies hx of DVT/PE. Admits to hx of brain aneurysm.   The history is provided by the patient. No language interpreter was used.  Headache Associated symptoms: eye pain and photophobia   Associated symptoms: no abdominal pain, no back pain, no cough, no ear pain, no fever, no seizures, no sore throat and no vomiting       Home Medications Prior to Admission medications   Medication Sig Start Date End Date Taking? Authorizing Provider  allopurinol (ZYLOPRIM) 100 MG tablet Take 100 mg by mouth daily.    [provider]  amLODipine (NORVASC) 10 MG tablet Take 10 mg by mouth daily.      [provider]  aspirin 325 MG EC tablet Take 650 mg by mouth every 6 (six) hours as needed for pain.     [provider]  calcium acetate (PHOSLO) 667 MG capsule Take 667 mg by mouth 3 (three) times daily with meals.  09/12/18   [provider]  Cholecalciferol (VITAMIN D3) 50 MCG (2000 UT) TABS Take 2,000 Units by mouth 2 (two) times daily.    [provider]  ferrous sulfate 325 (65 FE) MG tablet Take 650 mg by mouth daily with breakfast.     [provider]  furosemide (LASIX) 80 MG tablet Take 80 mg by mouth daily.    [provider]   insulin aspart protamine-insulin aspart (NOVOLOG 70/30) (70-30) 100 UNIT/ML injection Inject 12-15 Units into the skin See admin instructions. 15 units in the morning & 12 units in the evening    [provider]  labetalol (NORMODYNE) 200 MG tablet Take 200 mg by mouth 2 (two) times daily. 09/01/19   [provider]  latanoprost (XALATAN) 0.005 % ophthalmic solution Place 1 drop into both eyes at bedtime.     [provider]  Multiple Vitamin (MULTIVITAMIN WITH MINERALS) TABS Take 1 tablet by mouth daily.    [provider]  pantoprazole (PROTONIX) 40 MG tablet Take 1 tablet (40 mg total) by mouth 2 (two) times daily. 09/29/19 09/28/20  Otis Brace, MD      Allergies    Patient has no known allergies.    Review of Systems   Review of Systems  Constitutional:  Negative for chills and fever.  HENT:  Negative for ear pain and sore throat.   Eyes:  Positive for photophobia, pain and visual disturbance.  Respiratory:  Negative for cough and shortness of breath.   Cardiovascular:  Negative for chest pain and palpitations.  Gastrointestinal:  Negative for abdominal pain and vomiting.  Genitourinary:  Negative for dysuria and hematuria.  Musculoskeletal:  Negative for arthralgias and back pain.  Skin:  Negative for color change and rash.  Neurological:  Positive for headaches. Negative for seizures and syncope.  All other systems reviewed and are negative.  Physical Exam Updated Vital Signs BP (!) 156/73   Pulse 88   Temp 97.7 F (36.5 C)   Resp 17   Ht 5\' 2"  (1.575 m)   Wt 69.9 kg   LMP 06/08/2011   SpO2 99%   BMI 28.19 kg/m  Physical Exam Vitals and nursing note reviewed.  Constitutional:      General: She is not in acute distress.    Appearance: She is well-developed.  HENT:     Head: Normocephalic and atraumatic.  Eyes:     General: Visual field deficit present.     Conjunctiva/sclera: Conjunctivae normal.  Cardiovascular:     Rate  and Rhythm: Normal rate and regular rhythm.     Heart sounds: No murmur heard. Pulmonary:     Effort: Pulmonary effort is normal. No respiratory distress.     Breath sounds: Normal breath sounds.  Abdominal:     Palpations: Abdomen is soft.     Tenderness: There is no abdominal tenderness.  Musculoskeletal:        General: No swelling.     Cervical back: Neck supple.  Skin:    General: Skin is warm and dry.     Capillary Refill: Capillary refill takes less than 2 seconds.  Neurological:     General: No focal deficit present.     Mental Status: She is alert and oriented to person, place, and time.     GCS: GCS eye subscore is 4. GCS verbal subscore is 5. GCS motor subscore is 6.     Sensory: Sensation is intact.     Coordination: Coordination is intact.     Comments: Unable to count fingers in front of face. Able to detect horizontal versus vertical movement. EOM intact. Dilated pupil. Not reactive to light    Psychiatric:        Mood and Affect: Mood normal.    ED Results / Procedures / Treatments   Labs (all labs ordered are listed, but only abnormal results are displayed) Labs Reviewed  BASIC METABOLIC PANEL - Abnormal; Notable for the following components:      Result Value   Glucose, Bld 128 (*)    BUN 21 (*)    Creatinine, Ser 1.54 (*)    Calcium 10.6 (*)    GFR, Estimated 39 (*)    All other components within normal limits  CBC WITH DIFFERENTIAL/PLATELET    EKG None  Radiology CT Head Wo Contrast  Result Date: 02/06/2022 CLINICAL DATA:  Severe headache.  Previous cerebral aneurysm. EXAM: CT HEAD WITHOUT CONTRAST TECHNIQUE: Contiguous axial images were obtained from the base of the skull through the vertex without intravenous contrast. RADIATION DOSE REDUCTION: This exam was performed according to the departmental dose-optimization program which includes automated exposure control, adjustment of the mA and/or kV according to patient size and/or use of iterative  reconstruction technique. COMPARISON:  12/22/2010 FINDINGS: Brain: No evidence of intracranial hemorrhage, acute infarction, hydrocephalus, extra-axial collection, or mass lesion/mass effect. Aneurysm coil again seen in the left supraclinoid region. Vascular:  No hyperdense vessel or other acute findings. Skull: No evidence of fracture or other significant bone abnormality. Sinuses/Orbits:  No acute findings. Other: None. IMPRESSION: No acute intracranial abnormality. Electronically Signed   By: Marlaine Hind M.D.   On: 02/06/2022 18:28    Procedures .Critical Care Performed by: Lianne Cure, DO Authorized by: Lianne Cure, DO  Critical care provider statement:    Critical care time (minutes):  30   Critical care was necessary to treat or prevent imminent or life-threatening deterioration of the following conditions: stroke like symptoms. vision loss.   Critical care was time spent personally by me on the following activities:  Development of treatment plan with patient or surrogate, discussions with consultants, evaluation of patient's response to treatment, examination of patient, ordering and review of laboratory studies, ordering and review of radiographic studies, ordering and performing treatments and interventions, pulse oximetry, re-evaluation of patient's condition and review of old charts   Care discussed with comment:  ED physician    Medications Ordered in ED Medications  sodium chloride 0.9 % bolus 1,000 mL ( Intravenous Restarted 02/06/22 1739)  metoCLOPramide (REGLAN) injection 10 mg (10 mg Intravenous Given 02/06/22 1737)  diphenhydrAMINE (BENADRYL) injection 12.5 mg (12.5 mg Intravenous Given 02/06/22 1737)    ED Course/ Medical Decision Making/ A&P                           Medical Decision Making Amount and/or Complexity of Data Reviewed Labs: ordered. Radiology: ordered.  Risk Prescription drug management.   Patient is a 57 year old female presenting for changes  in the right eye x 1 week.  Patient is alert and oriented x3, no acute distress, afebrile, stable vital signs.  Physical exam demonstrates normal extraocular eye movements.  Pupil is dilated and not reactive to light.  Able to count fingers.  Unable to detect light movement such as horizontal versus vertical movement of hand.  States she saw ophthalmology yesterday and had a dilated funduscopic exam with concerns for arterial occlusion.  He had demonstrates no acute process.  Patient recommended for transfer to [facility] for MRI.  Accepting physician Dr. Eulis Foster.   MRI ordered.         Final Clinical Impression(s) / ED Diagnoses Final diagnoses:  Stroke-like symptoms  Vision changes  Fixed dilated pupil    Rx / DC Orders ED Discharge Orders     None         Lianne Cure, DO 46/95/07 2049

## 2022-02-06 NOTE — ED Notes (Signed)
[facility] at the Bedside. 

## 2022-02-06 NOTE — ED Provider Notes (Signed)
22:45: I assumed care of patient in transfer from [facility] emergency department pending MRI brain wo contrast.  Please see prior provider note for full H&P.  Briefly patient is a 56 yo female w/ a hx of cerebral aneurysm, glaucoma, CKD S/p renal transplant on mycophenolate & prednisone DM, and HTN who presented to the ED with complaints of R eye pain w/ vision changes x 1 month with new headache x 6 days.   Patient reports that she is s/p surgical intervention for cataract & glaucoma x 3 since the new year, most recent procedure was in March/April. Reports she felt fairly well following the procedure for a few weeks- had some minimal vision intact in the right, but has since developed right eye pain and decreased vision. Seen by  her ophthalmologist 02/01/22 for her sxs, reports she had an eye injection (unsure what type) and subsequently developed a fairly quick right sided headache. Reports that her eye was dilated during this visit and there were findings concerning for stroke and was told to go to the ED if symptoms got worse.   Physical Exam  BP (!) 153/86   Pulse (!) 102   Temp 97.7 F (36.5 C)   Resp 17   Ht '5\' 2"'  (1.575 m)   Wt 69.9 kg   LMP 06/08/2011   SpO2 100%   BMI 28.19 kg/m   Physical Exam Vitals and nursing note reviewed.  HENT:     Head: Normocephalic and atraumatic.     Right Ear: Tympanic membrane is not retracted or bulging.     Left Ear: Tympanic membrane is not retracted or bulging.  Eyes:     Comments: Right pupil dilated, oblong, and nonreactive.  Right eye conjunctival injection with mild chemosis. Right eye gaze is deviated laterally, able to perform extra ocular movements with significant difficulty. Not proptotic. No significant periorbital erythema..   Musculoskeletal:     Cervical back: No rigidity.  Neurological:     Mental Status: She is alert.        Procedures  .Critical Care E&M Performed by: Amaryllis Dyke, PA-C  After initial E/M  assessment, critical care services were subsequently performed that were exclusive of separately billable procedures or treatment.   CRITICAL CARE Performed by: Kennith Maes   Total critical care time: 40 minutes  Critical care time was exclusive of separately billable procedures and treating other patients.  Critical care was necessary to treat or prevent imminent or life-threatening deterioration.  Critical care was time spent personally by me on the following activities: development of treatment plan with patient and/or surrogate as well as nursing, discussions with consultants, evaluation of patient's response to treatment, examination of patient, obtaining history from patient or surrogate, ordering and performing treatments and interventions, ordering and review of laboratory studies, ordering and review of radiographic studies, pulse oximetry and re-evaluation of patient's condition.  Results for orders placed or performed during the hospital encounter of 02/06/22  CBC with Differential  Result Value Ref Range   WBC 5.4 4.0 - 10.5 K/uL   RBC 4.89 3.87 - 5.11 MIL/uL   Hemoglobin 13.3 12.0 - 15.0 g/dL   HCT 41.3 36.0 - 46.0 %   MCV 84.5 80.0 - 100.0 fL   MCH 27.2 26.0 - 34.0 pg   MCHC 32.2 30.0 - 36.0 g/dL   RDW 14.7 11.5 - 15.5 %   Platelets 265 150 - 400 K/uL   nRBC 0.0 0.0 - 0.2 %   Neutrophils Relative %  76 %   Neutro Abs 4.1 1.7 - 7.7 K/uL   Lymphocytes Relative 13 %   Lymphs Abs 0.7 0.7 - 4.0 K/uL   Monocytes Relative 10 %   Monocytes Absolute 0.5 0.1 - 1.0 K/uL   Eosinophils Relative 1 %   Eosinophils Absolute 0.1 0.0 - 0.5 K/uL   Basophils Relative 0 %   Basophils Absolute 0.0 0.0 - 0.1 K/uL   Immature Granulocytes 0 %   Abs Immature Granulocytes 0.02 0.00 - 0.07 K/uL  Basic metabolic panel  Result Value Ref Range   Sodium 140 135 - 145 mmol/L   Potassium 4.1 3.5 - 5.1 mmol/L   Chloride 104 98 - 111 mmol/L   CO2 26 22 - 32 mmol/L   Glucose, Bld 128 (H)  70 - 99 mg/dL   BUN 21 (H) 6 - 20 mg/dL   Creatinine, Ser 1.54 (H) 0.44 - 1.00 mg/dL   Calcium 10.6 (H) 8.9 - 10.3 mg/dL   GFR, Estimated 39 (L) >60 mL/min   Anion gap 10 5 - 15  Sedimentation rate  Result Value Ref Range   Sed Rate 20 0 - 22 mm/hr  C-reactive protein  Result Value Ref Range   CRP 5.1 (H) <1.0 mg/dL   CT Head Wo Contrast  Result Date: 02/06/2022 CLINICAL DATA:  Severe headache.  Previous cerebral aneurysm. EXAM: CT HEAD WITHOUT CONTRAST TECHNIQUE: Contiguous axial images were obtained from the base of the skull through the vertex without intravenous contrast. RADIATION DOSE REDUCTION: This exam was performed according to the departmental dose-optimization program which includes automated exposure control, adjustment of the mA and/or kV according to patient size and/or use of iterative reconstruction technique. COMPARISON:  12/22/2010 FINDINGS: Brain: No evidence of intracranial hemorrhage, acute infarction, hydrocephalus, extra-axial collection, or mass lesion/mass effect. Aneurysm coil again seen in the left supraclinoid region. Vascular:  No hyperdense vessel or other acute findings. Skull: No evidence of fracture or other significant bone abnormality. Sinuses/Orbits:  No acute findings. Other: None. IMPRESSION: No acute intracranial abnormality. Electronically Signed   By: Marlaine Hind M.D.   On: 02/06/2022 18:28   MR ANGIO HEAD WO CONTRAST  Result Date: 02/07/2022 CLINICAL DATA:  56 year old female status post right eye surgery and injections for glaucoma. Subsequent headaches, right side head and face pain. History of left posterior communicating artery aneurysm. EXAM: MRA HEAD WITHOUT CONTRAST TECHNIQUE: Angiographic images of the Circle of Willis were acquired using MRA technique without intravenous contrast. COMPARISON:  Brain MRI and intracranial MRV today reported separately. Head CT yesterday. Conventional cerebral angiogram 07/11/2011. FINDINGS: Anterior circulation:  Antegrade flow in both ICA siphons. Mild siphon irregularity. No significant siphon stenosis. Patent carotid termini, MCA and ACA origins. Both posterior communicating arteries are present. Along the left posterolateral supraclinoid ICA there is susceptibility artifact compatible with the embolization coil pack demonstrated by CT (series 5, image 102 of this exam). Situated between this area and the medial origin of the left posterior communicating artery is a saccular lesion tracking caudal, medial to the coil pack on series 5, image 94. This measures 2-3 mm diameter by 4-5 mm length and is well demonstrated on series 1033 image 5. This was 3 x 4 mm in 2012. Contralateral right posterior communicating artery origin appears normal. Ophthalmic artery origins appear to remain normal. Anterior communicating artery and visible ACA branches are within normal limits. MCA M1 segments and MCA tri/bifurcations are patent without stenosis. Visible bilateral MCA branches are within normal limits. Posterior circulation:  Antegrade flow in the posterior circulation with dominant distal left vertebral artery. The distal right vertebral artery remains patent to the vertebrobasilar junction and both PICA origins appear normal. No distal vertebral stenosis. Patent basilar artery without stenosis. Patent SCA and PCA origins. Both posterior communicating arteries are present. Bilateral PCA branches are within normal limits. Anatomic variants: Dominant left vertebral artery. Other: Brain MRI and intracranial MRV reported separately. IMPRESSION: 1. Chronically treated Left supraclinoid ICA Aneurysm with associated chronic recurrence and/or coil compaction. The recurrent saccular Aneurysm component with flow extends caudal along the medial coil pack now measures up to 3 x 5 mm (versus 3 x 4 mm in 2012). 2. Otherwise negative intracranial MRA; no intracranial stenosis or other intracranial aneurysm identified. Electronically Signed   By: Genevie Ann M.D.   On: 02/07/2022 04:52   MR BRAIN W WO CONTRAST  Result Date: 02/07/2022 CLINICAL DATA:  56 year old female status post right eye surgery and injections for glaucoma. Subsequent headaches, right side head and face pain. History of left posterior communicating artery aneurysm. EXAM: MRI HEAD WITHOUT AND WITH CONTRAST TECHNIQUE: Multiplanar, multiecho pulse sequences of the brain and surrounding structures were obtained without and with intravenous contrast. CONTRAST:  69m GADAVIST GADOBUTROL 1 MMOL/ML IV SOLN COMPARISON:  Head CT without contrast 02/06/2022. Intracranial MRA and MRV today reported separately. FINDINGS: Brain: Cerebral volume appears normal for age. No restricted diffusion to suggest acute infarction. No midline shift, mass effect, evidence of mass lesion, ventriculomegaly, extra-axial collection or acute intracranial hemorrhage. Cervicomedullary junction and pituitary are within normal limits. Patchy and scattered bilateral cerebral white matter T2 and FLAIR hyperintensity in both hemispheres, especially the periatrial white matter (series 11, image 16), nonspecific. No cortical encephalomalacia or chronic cerebral blood products identified. Deep gray matter nuclei, brainstem and cerebellum appear normal. No abnormal enhancement identified.  No dural thickening identified. Vascular: Major intracranial vascular flow voids are preserved. Distal left vertebral artery appears to be dominant. The major dural venous sinuses are enhancing and appear to be patent. See also MRA and MRV reported separately. Skull and upper cervical spine: Evidence of previous frontal craniotomy. Underlying bone marrow signal is within normal limits. Negative visible cervical spine. Sinuses/Orbits: Normal suprasellar cistern. Optic chiasm appears normal. Postoperative changes to the right globe and Disconjugate gaze (series 10, image 11), but otherwise negative orbits soft tissues. Mild to moderate paranasal  sinus mucosal thickening most pronounced in the right anterior ethmoids. No sinus fluid level identified. Other: Mastoid air cells are well aerated. Visible internal auditory structures appear normal. No acute scalp or face soft tissue finding identified. IMPRESSION: 1. No acute intracranial abnormality. Intracranial MRA and MRV are reported separately. 2. Evidence of previous craniotomy. Mild to moderate for age nonspecific cerebral white matter signal changes, most commonly due to chronic small vessel disease. 3. Mild to moderate paranasal mucosal thickening, but no sinus fluid level to strongly suggest acute sinusitis. Electronically Signed   By: HGenevie AnnM.D.   On: 02/07/2022 04:43   MR MRV HEAD W WO CONTRAST  Result Date: 02/07/2022 CLINICAL DATA:  56year old female status post right eye surgery and injections for glaucoma. Subsequent headaches, right side head and face pain. History of left posterior communicating artery aneurysm. EXAM: MR VENOGRAM HEAD WITHOUT AND WITH CONTRAST TECHNIQUE: Angiographic images of the intracranial venous structures were acquired using MRV technique without and with intravenous contrast. CONTRAST:  644mGADAVIST GADOBUTROL 1 MMOL/ML IV SOLN in conjunction with contrast enhanced imaging of the brain reported  separately. COMPARISON:  Brain MRI the same day reported separately. FINDINGS: Precontrast time-of-flight imaging demonstrates preserved flow signal in the superior sagittal sinus, torcula, straight sinus, vein of Galen, internal cerebral veins, basal veins of Rosenthal, bilateral transverse sinuses, bilateral sigmoid sinuses, and bilateral IJ bulbs. There is also preserved flow signal in the major draining cortical veins including Trolard and Labbe. Likewise, postcontrast images in conjunction with the brain MRI today demonstrate expected enhancement of the major dural venous sinuses, including the cavernous sinus. IMPRESSION: Normal intracranial MRV.  No evidence of dural  sinus thrombosis. Electronically Signed   By: Genevie Ann M.D.   On: 02/07/2022 04:45     ED Course / MDM    Medical Decision Making Amount and/or Complexity of Data Reviewed Labs: ordered. Radiology: ordered.  Risk Prescription drug management.  I view & interpreted labs:  CBC: unremarkable.  BMP: elevated creatinine- improved from most recent in care everywhere.   I promptly staffed patient with attending Dr. Randal Buba.  Consult placed to ophthalmology on call for patient's practice.   00:50: CONSULT: Discussed with ophthalmologist Dr. Valma Cava with [facility] she has reviewed prior clinic notes- relays patient most recently had Cataract surgery in March of this year, followed up with practice 04/27- she had stopped her steroid drops and was noted to have inflammation, re-started on steroid drops at that time, followed up 1.5 weeks later and was found to have increased IOP, started on cosopt BID at that time with plan to follow up with retinal specialist. Seen by Dr. Stacie Glaze 05/24- notes report dilated non-reactive right pupil @ that time, findings concerning for ocular ischemic syndrome- recommended stroke evaluation, and was given an avastin injection to the right eye with plans to follow up.  Discussed current presentation with Dr. Valma Cava, plan for MRI in the ED, agrees with obtaining CRP/ESR, she relays from an ophthalmology stand point can follow up in clinic in the AM.   I subsequently consulted to neurology to ensure appropriate imaging orders placed. I have also ordered CRP/ESR   01:04: CONSULT: Discussed with neurologist Dr. Curly Shores, will see patient in consultation.   Sed rate: WNL CRP: elevated @ 5.1- possibly related to her immunosuppressed state on mycophenolate and prednisone, with normal ESR seems unlikely to be temporal arteritis.   Dr. Curly Shores has placed orders for MRV w/wo, MRA wo, and MRI brain w/wo.   MRI brain w/wo: 1. No acute intracranial abnormality.  Intracranial MRA and MRV are reported separately. 2. Evidence of previous craniotomy. Mild to moderate for age nonspecific cerebral white matter signal changes, most commonly due to chronic small vessel disease. 3. Mild to moderate paranasal mucosal thickening, but no sinus fluid level to strongly suggest acute sinusitis  MRA w/o: . No acute intracranial abnormality. Intracranial MRA and MRV are reported separately. 2. Evidence of previous craniotomy. Mild to moderate for age nonspecific cerebral white matter signal changes, most commonly due to chronic small vessel disease. 3. Mild to moderate paranasal mucosal thickening, but no sinus fluid level to strongly suggest acute sinusitis.  MRV w/wo: Normal intracranial MRV.  No evidence of dural sinus thrombosis  06:25: CONSULT: Re-discussed with neurologist Dr. Curly Shores who has reviewed imaging with radiologist, relays no further work-up from a neurologic standpoint, recommends ophthalmology consultation.  06:40: CONSULT: Re-discussed case with ophthalmologist Dr. Georgie Chard follow-up in clinic for assessment as opposed to ED evaluation- her office will coordinate and call the patient this morning to ensure close follow-up.  We will provide patient with  a short course of pain control.  Ophthalmology follow-up.  I discussed results, treatment plan, need for follow-up, and return precautions with the patient. Provided opportunity for questions, patient confirmed understanding and is in agreement with plan.   Findings and plan of care discussed with supervising physician Dr. Randal Buba who has provided guidance & is in agreement.     Leafy Kindle 02/07/22 0710    Palumbo, April, MD 02/07/22 2344

## 2022-02-06 NOTE — ED Notes (Signed)
Care Handoff/Patient Report given to RN at [facility] at this Time. All Questions Answered.

## 2022-02-06 NOTE — ED Notes (Signed)
Care Handoff/Patient Report given to [facility] at this Time. All Questions Answered. 

## 2022-02-07 ENCOUNTER — Emergency Department (HOSPITAL_COMMUNITY): Payer: BC Managed Care – PPO

## 2022-02-07 ENCOUNTER — Telehealth: Payer: Self-pay | Admitting: Neurology

## 2022-02-07 DIAGNOSIS — H5704 Mydriasis: Secondary | ICD-10-CM | POA: Diagnosis not present

## 2022-02-07 DIAGNOSIS — H539 Unspecified visual disturbance: Secondary | ICD-10-CM

## 2022-02-07 DIAGNOSIS — H409 Unspecified glaucoma: Secondary | ICD-10-CM | POA: Diagnosis not present

## 2022-02-07 DIAGNOSIS — R519 Headache, unspecified: Secondary | ICD-10-CM | POA: Diagnosis not present

## 2022-02-07 LAB — C-REACTIVE PROTEIN: CRP: 5.1 mg/dL — ABNORMAL HIGH (ref <1.0)

## 2022-02-07 LAB — CBG MONITORING, ED: Glucose-Capillary: 207 mg/dL — ABNORMAL HIGH (ref 70–99)

## 2022-02-07 LAB — SEDIMENTATION RATE: Sed Rate: 20 mm/hr (ref 0–22)

## 2022-02-07 IMAGING — MR MR HEAD WO/W CM
14 of 16 series · 42 of 48 positions shown · IV contrast (gadavist)
Comparison: Head CT without contrast [DATE]. Intracranial MRA
and MRV today reported separately.

CLINICAL DATA: 55-year-old female status post right eye surgery and
injections for glaucoma. Subsequent headaches, right side head and
face pain.

History of left posterior communicating artery aneurysm.
EXAM:
MRI HEAD WITHOUT AND WITH CONTRAST
TECHNIQUE: Multiplanar, multiecho pulse sequences of the brain and surrounding
structures were obtained without and with intravenous contrast.
CONTRAST:  6mL GADAVIST GADOBUTROL 1 MMOL/ML IV SOLN

[Series 5: DWI · axial · 3.0mm · 0.96mm/px · z∈[-75,+81]mm · 8 of 112 slices shown (1 of 4)]
[im 1/112]
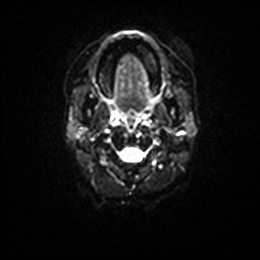
[im 16/112]
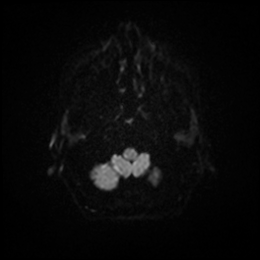
[im 32/112]
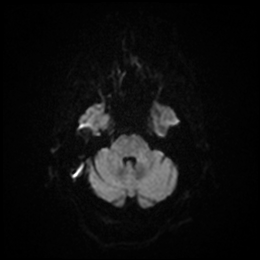
[im 48/112]
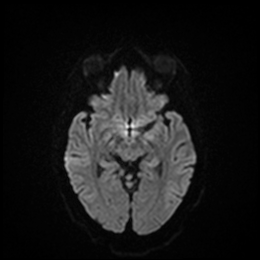
[im 64/112]
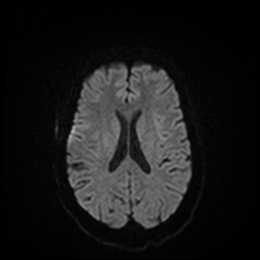
[im 80/112]
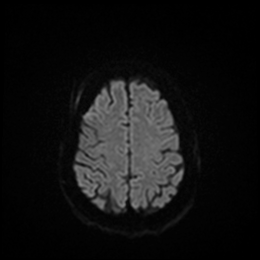
[im 96/112]
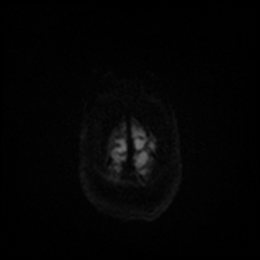
[im 112/112]
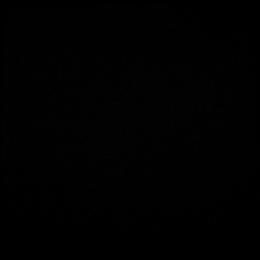

[Series 6: DWI · axial · 3.0mm · 0.96mm/px · z∈[-75,+75]mm · 3 of 54 slices shown (2 of 4)]
[im 1/54]
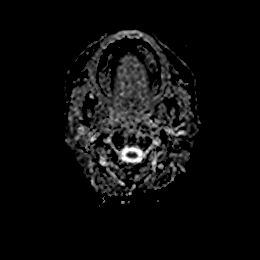
[im 27/54]
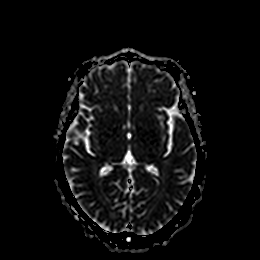
[im 54/54]
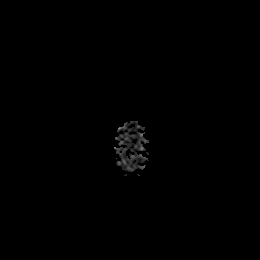

[Series 7: DWI · coronal · 4.0mm · 0.88mm/px · 5 of 80 slices shown (3 of 4)]
[im 1/80]
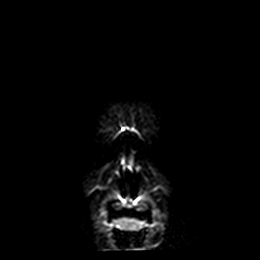
[im 20/80]
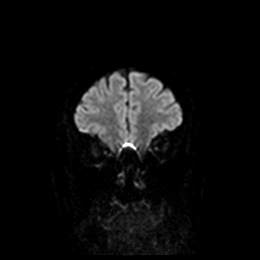
[im 40/80]
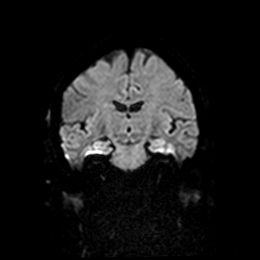
[im 60/80]
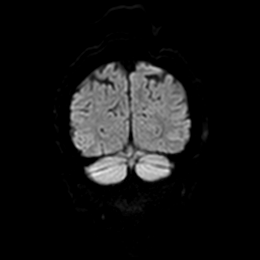
[im 80/80]
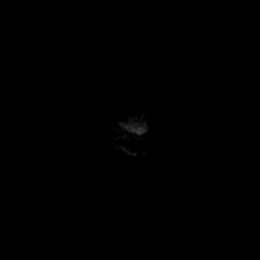

[Series 8: DWI · coronal · 4.0mm · 0.88mm/px · 2 of 40 slices shown (4 of 4)]
[im 1/40]
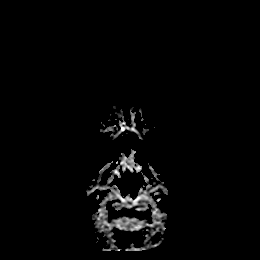
[im 40/40]
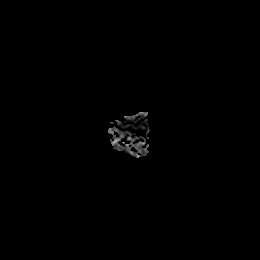

[Series 9: T1 · sagittal · 5.0mm · 0.75mm/px · 2 of 27 slices shown]
[im 1/27]
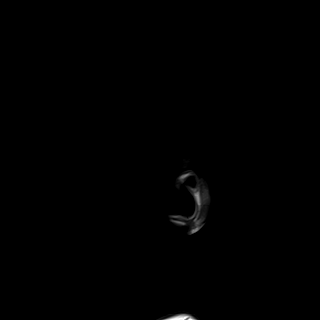
[im 27/27]
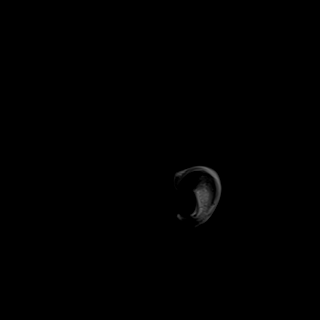

[Series 10: T2 · axial · 5.0mm · 0.75mm/px · z∈[-73,+80]mm · 2 of 28 slices shown]
[im 1/28]
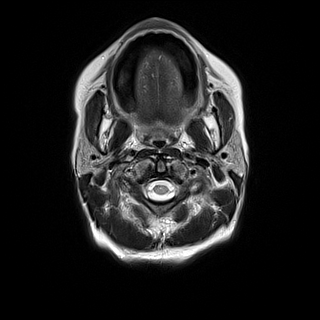
[im 28/28]
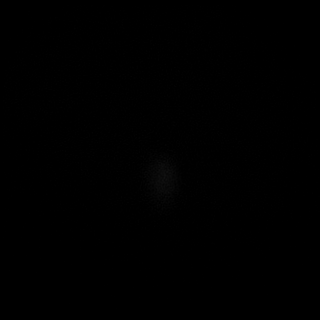

[Series 11: FLAIR · axial · 5.0mm · 0.49mm/px · z∈[-76,+77]mm · 2 of 28 slices shown]
[im 1/28]
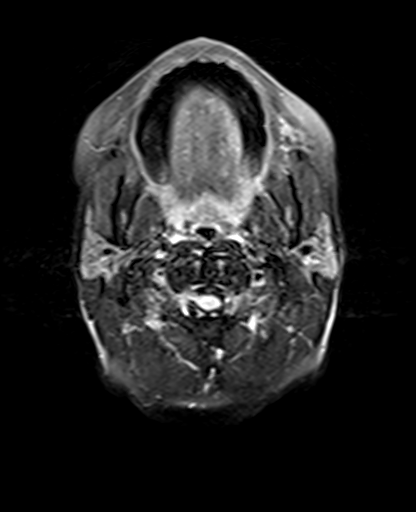
[im 28/28]
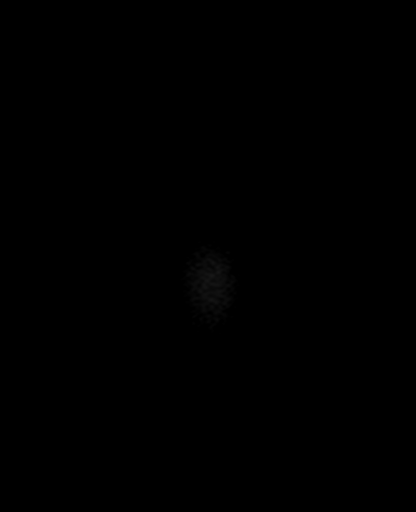

[Series 12: mag_images · axial · 3.0mm · 0.98mm/px · z∈[-77,+78]mm · 3 of 56 slices shown]
[im 1/56]
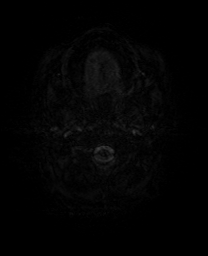
[im 28/56]
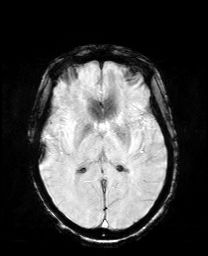
[im 56/56]
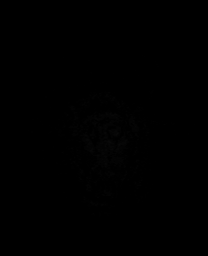

[Series 13: pha_images · axial · 3.0mm · 0.98mm/px · z∈[-77,+78]mm · 3 of 56 slices shown]
[im 1/56]
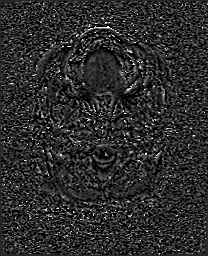
[im 28/56]
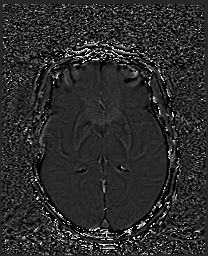
[im 56/56]
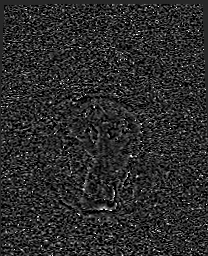

[Series 14: swi_images · axial · 3.0mm · 0.98mm/px · z∈[-77,+78]mm · 3 of 56 slices shown]
[im 1/56]
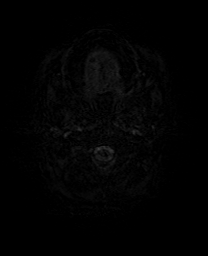
[im 28/56]
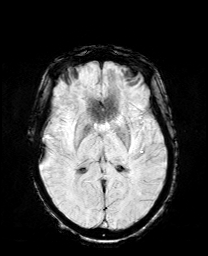
[im 56/56]
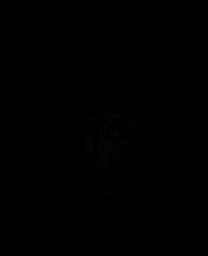

[Series 15: mip_images(sw) · axial · 24.0mm · 0.98mm/px · z∈[-67,+68]mm · 3 of 49 slices shown]
[im 1/49]
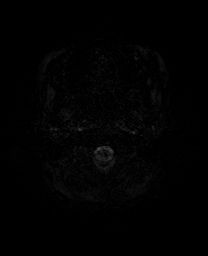
[im 25/49]
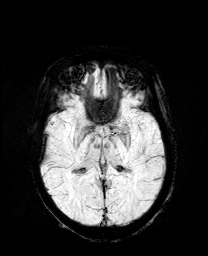
[im 49/49]
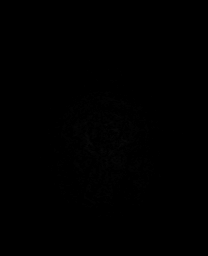

[Series 18: T2 post-contrast · coronal · 5.0mm · 0.72mm/px · 2 of 34 slices shown]
[im 1/34]
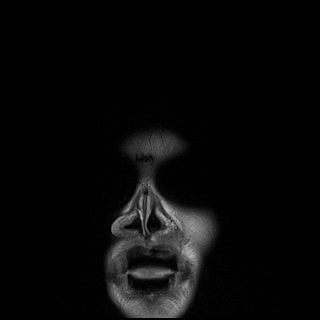
[im 34/34]
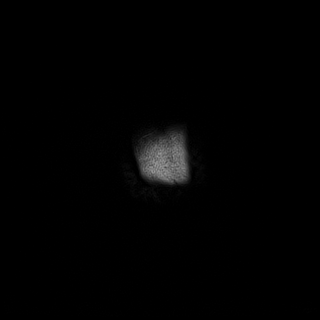

[Series 19: T1 post-contrast · coronal · 5.0mm · 0.34mm/px · 2 of 34 slices shown (1 of 2)]
[im 1/34]
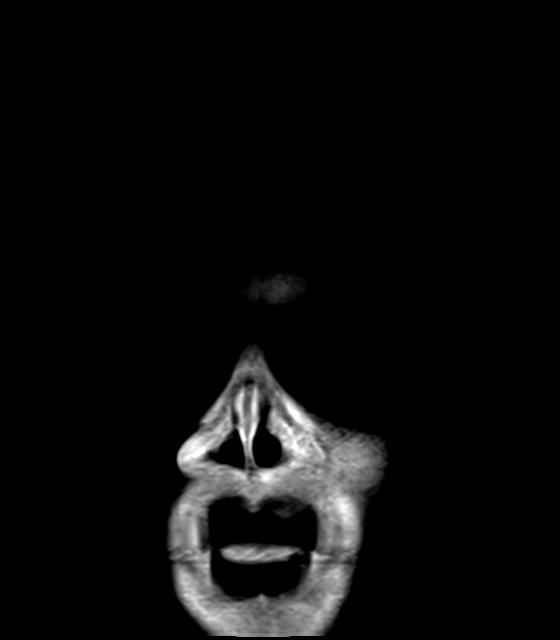
[im 34/34]
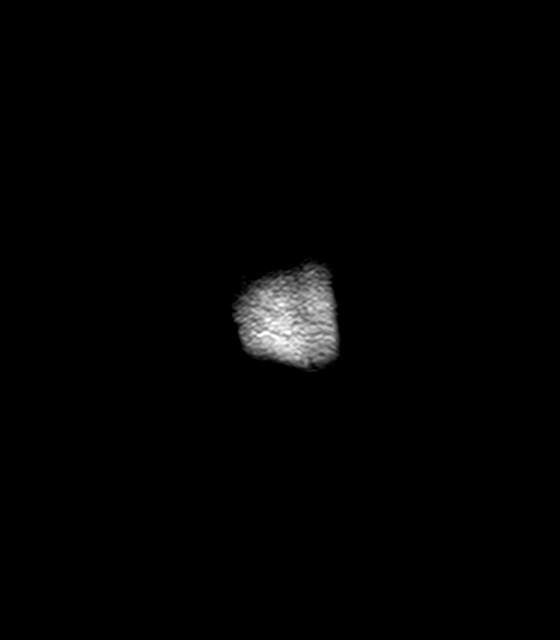

[Series 20: T1 post-contrast · sagittal · 5.0mm · 0.78mm/px · 2 of 27 slices shown (2 of 2)]
[im 1/27]
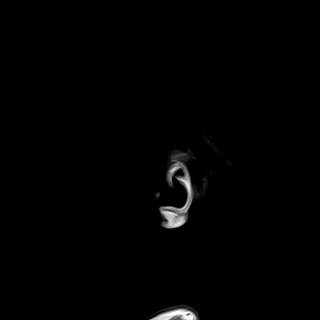
[im 27/27]
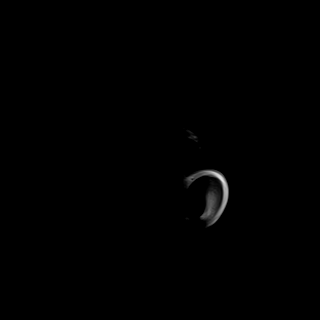

[42 of 48 positions shown; findings below may reference images not displayed]

FINDINGS: Brain: Cerebral volume appears normal for age. No restricted
diffusion to suggest acute infarction. No midline shift, mass
effect, evidence of mass lesion, ventriculomegaly, extra-axial
collection or acute intracranial hemorrhage. Cervicomedullary
junction and pituitary are within normal limits.

Patchy and scattered bilateral cerebral white matter T2 and FLAIR
hyperintensity in both hemispheres, especially the periatrial white
matter (series 11, image 16), nonspecific. No cortical
encephalomalacia or chronic cerebral blood products identified. Deep
gray matter nuclei, brainstem and cerebellum appear normal.

No abnormal enhancement identified.  No dural thickening identified.

Vascular: Major intracranial vascular flow voids are preserved.
Distal left vertebral artery appears to be dominant. The major dural
venous sinuses are enhancing and appear to be patent. See also MRA
and MRV reported separately.

Skull and upper cervical spine: Evidence of previous frontal
craniotomy. Underlying bone marrow signal is within normal limits.
Negative visible cervical spine.

Sinuses/Orbits: Normal suprasellar cistern. Optic chiasm appears
normal. Postoperative changes to the right globe and Disconjugate
gaze (series 10, image 11), but otherwise negative orbits soft
tissues. Mild to moderate paranasal sinus mucosal thickening most
pronounced in the right anterior ethmoids. No sinus fluid level
identified.

Other: Mastoid air cells are well aerated. Visible internal auditory
structures appear normal. No acute scalp or face soft tissue finding
identified.
IMPRESSION: 1. No acute intracranial abnormality. Intracranial MRA and MRV are
reported separately.
2. Evidence of previous craniotomy. Mild to moderate for age
nonspecific cerebral white matter signal changes, most commonly due
to chronic small vessel disease.
3. Mild to moderate paranasal mucosal thickening, but no sinus fluid
level to strongly suggest acute sinusitis.

## 2022-02-07 IMAGING — MR MR MRV HEAD WO/W CM
5 series · 35 of 48 positions shown · IV contrast (Gadavist)
Comparison: Brain MRI the same day reported separately.

CLINICAL DATA: 55-year-old female status post right eye surgery and
injections for glaucoma. Subsequent headaches, right side head and
face pain.

History of left posterior communicating artery aneurysm.
EXAM:
MR VENOGRAM HEAD WITHOUT AND WITH CONTRAST
TECHNIQUE: Angiographic images of the intracranial venous structures were
acquired using MRV technique without and with intravenous contrast.
CONTRAST:  6mL GADAVIST GADOBUTROL 1 MMOL/ML IV SOLN in conjunction
with contrast enhanced imaging of the brain reported separately.

[Series 5: tof_fl2d_paracor · coronal · 2.5mm · 0.98mm/px · 9 of 128 slices shown]
[im 1/128]
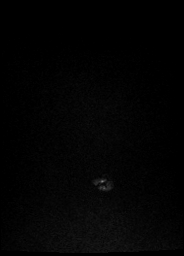
[im 16/128]
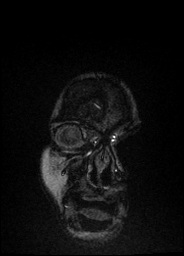
[im 32/128]
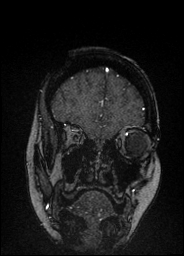
[im 48/128]
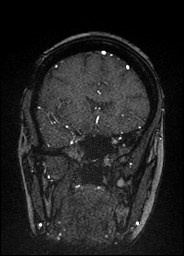
[im 64/128]
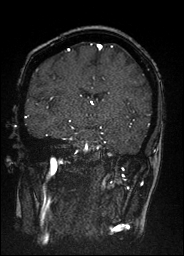
[im 80/128]
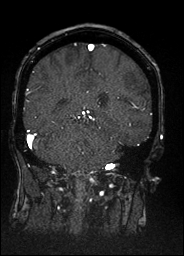
[im 96/128]
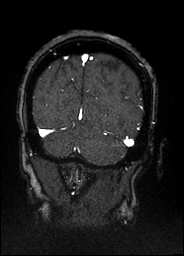
[im 112/128]
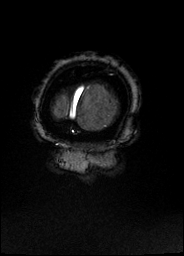
[im 128/128]
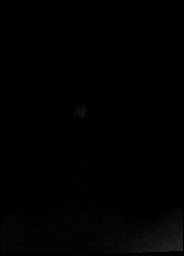

[Series 9: venous inhance coronal · coronal · portal-venous · 1.0mm · 0.57mm/px · 10 of 208 slices shown]
[im 14/208]
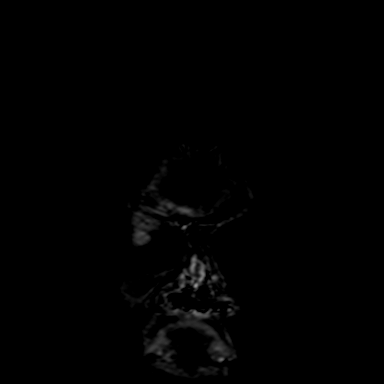
[im 28/208]
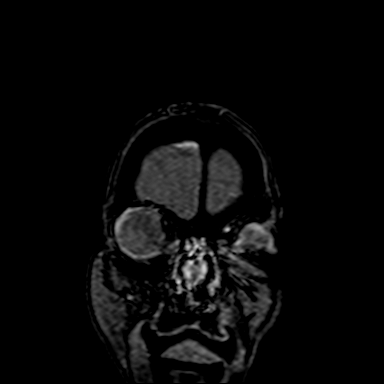
[im 42/208]
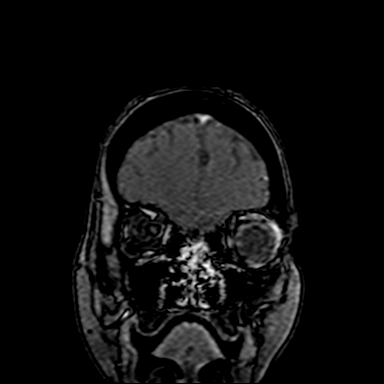
[im 70/208]
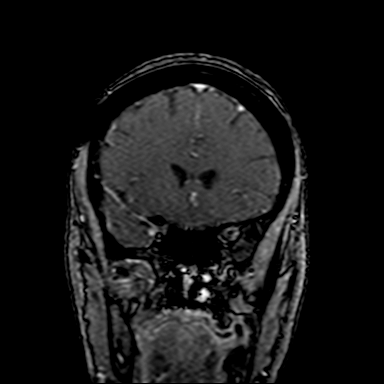
[im 97/208]
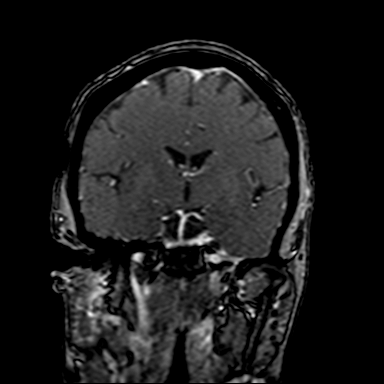
[im 111/208]
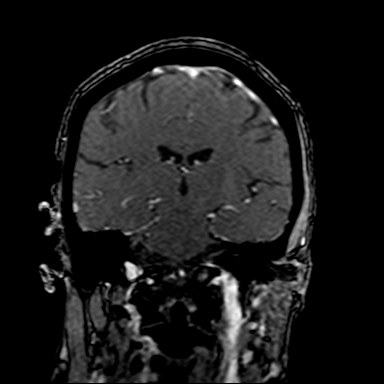
[im 125/208]
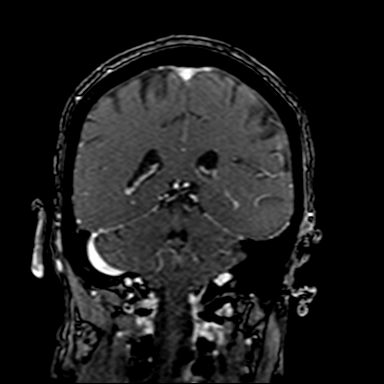
[im 152/208]
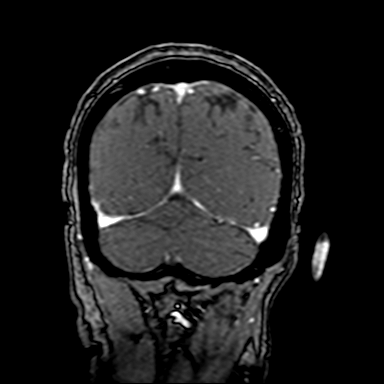
[im 180/208]
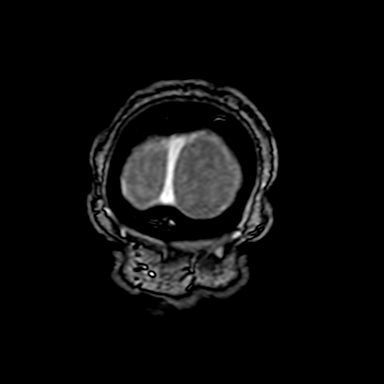
[im 208/208]
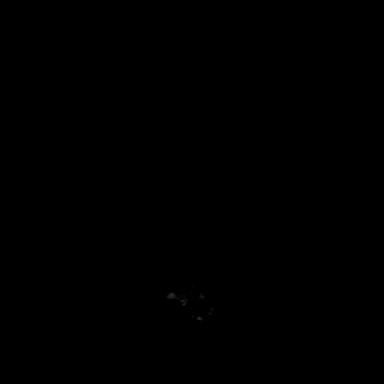

[Series 10: venous inhance coronal_msum · coronal · portal-venous · 1.0mm · 0.57mm/px · 10 of 208 slices shown]
[im 14/208]
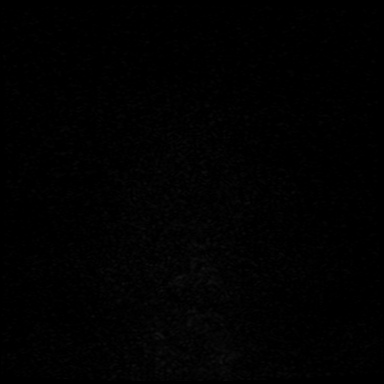
[im 28/208]
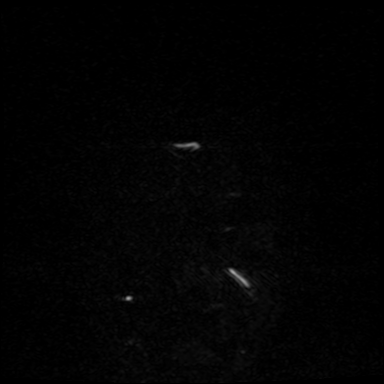
[im 42/208]
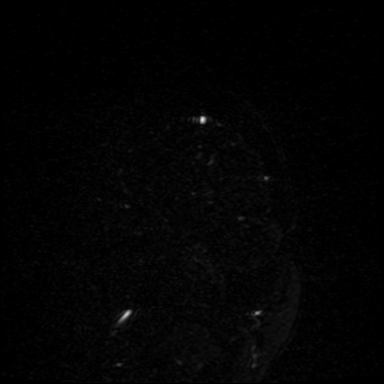
[im 70/208]
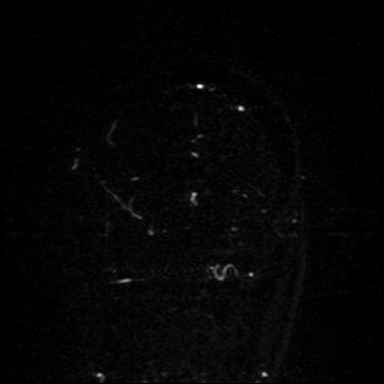
[im 97/208]
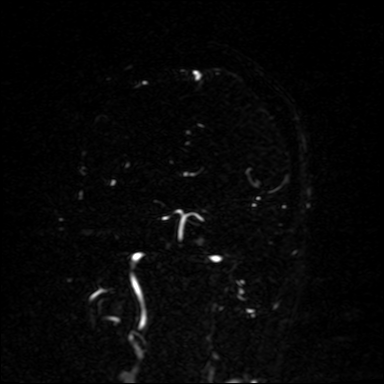
[im 111/208]
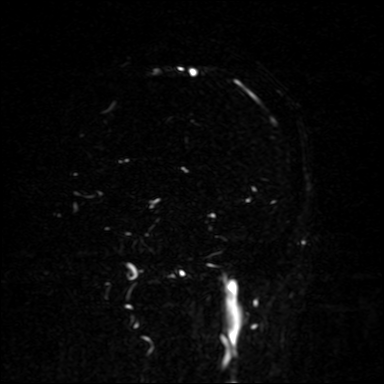
[im 125/208]
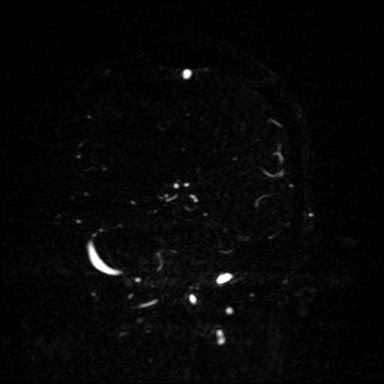
[im 152/208]
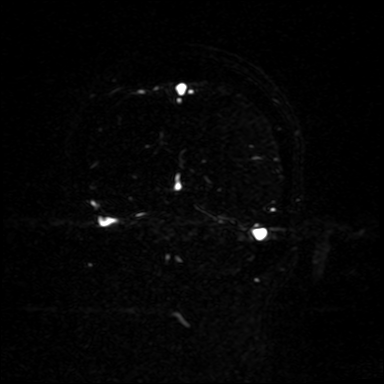
[im 180/208]
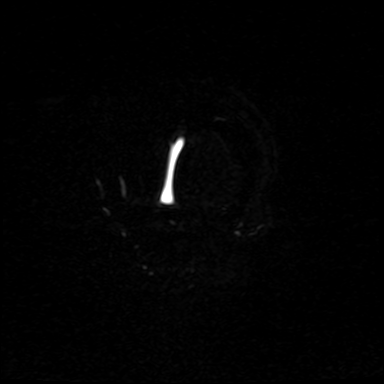
[im 208/208]
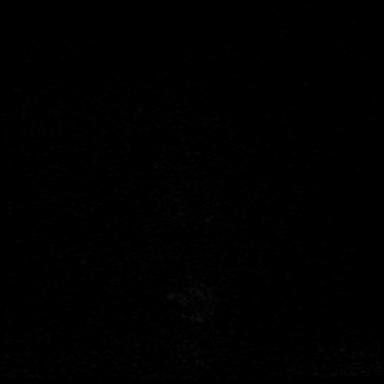

[Series 13: flow_pc3d_sag_fast · sagittal · 3.0mm · 0.43mm/px · 4 of 52 slices shown]
[im 1/52]
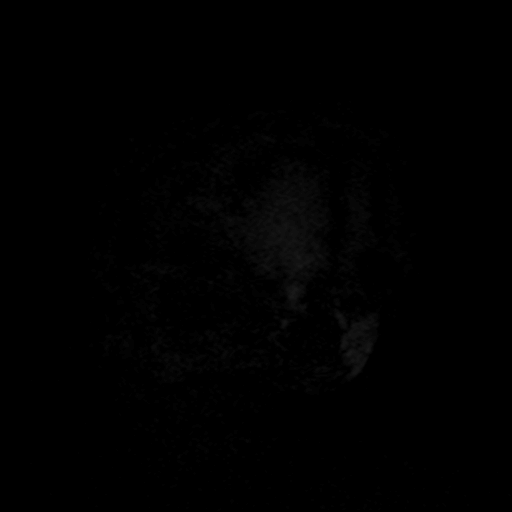
[im 18/52]
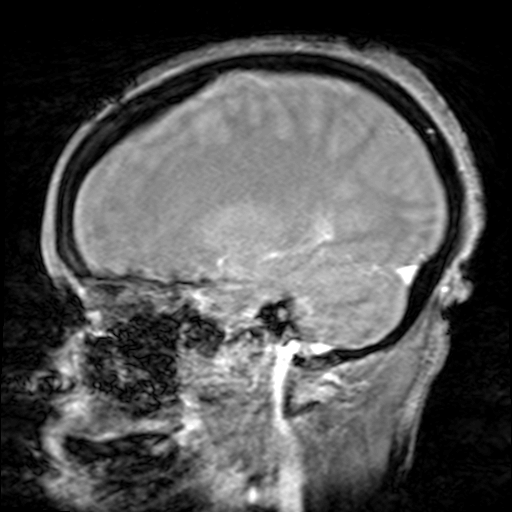
[im 35/52]
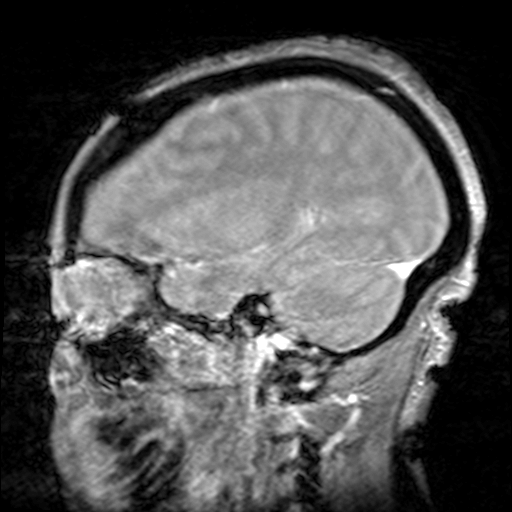
[im 52/52]
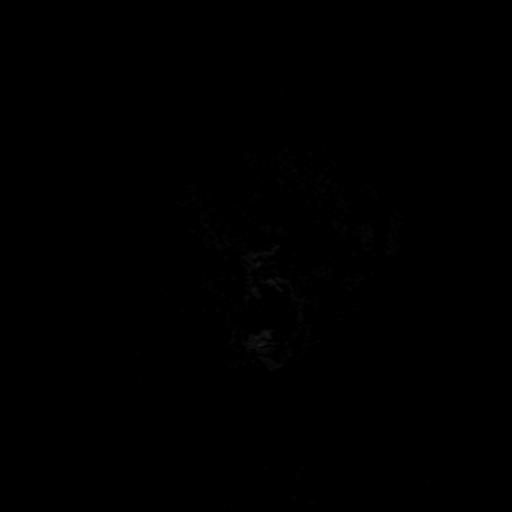

[Series 14: flow_pc3d_sag_fast_msum · sagittal · 3.0mm · 0.43mm/px · 2 of 45 slices shown]
[im 1/45]
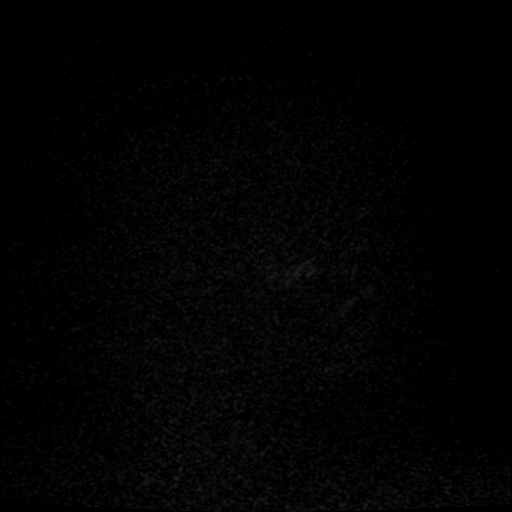
[im 23/45]
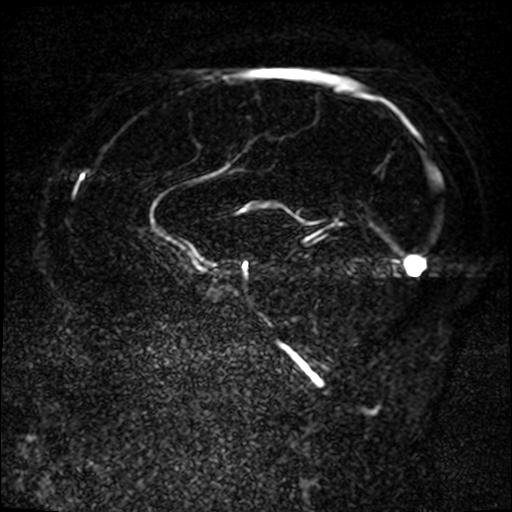

[35 of 48 positions shown; findings below may reference images not displayed]

FINDINGS: Precontrast time-of-flight imaging demonstrates preserved flow
signal in the superior sagittal sinus, torcula, straight sinus, vein
of MMAAGWESASA, internal cerebral veins, basal veins of MMAAGWESASA,
bilateral transverse sinuses, bilateral sigmoid sinuses, and
bilateral IJ bulbs. There is also preserved flow signal in the major
draining cortical veins including [place] and Labbe.

Likewise, postcontrast images in conjunction with the brain MRI
today demonstrate expected enhancement of the major dural venous
sinuses, including the cavernous sinus.
IMPRESSION: Normal intracranial MRV.  No evidence of dural sinus thrombosis.

## 2022-02-07 IMAGING — MR MR MRA HEAD W/O CM
1 series · 18 of 48 positions shown · non-contrast
Comparison: Brain MRI and intracranial MRV today reported
separately.

Head CT yesterday.

Conventional cerebral angiogram [DATE].

CLINICAL DATA: 55-year-old female status post right eye surgery and
injections for glaucoma. Subsequent headaches, right side head and
face pain.

History of left posterior communicating artery aneurysm.
EXAM:
MRA HEAD WITHOUT CONTRAST
TECHNIQUE: Angiographic images of the Circle of Willis were acquired using MRA
technique without intravenous contrast.

[Series 5: 3d cow · axial · 0.5mm · 0.41mm/px · z∈[-57,+28]mm · 18 of 188 slices shown]
[im 1/188]
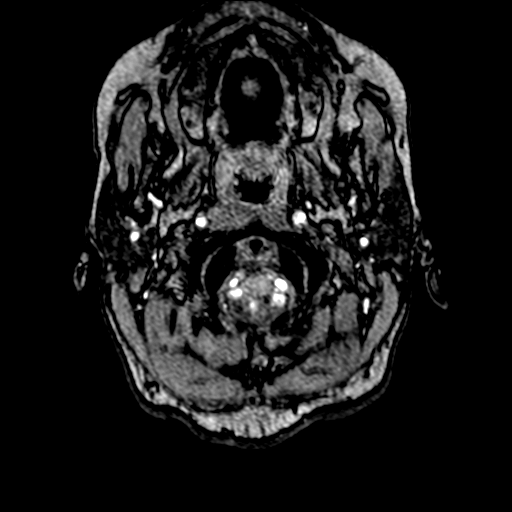
[im 4/188]
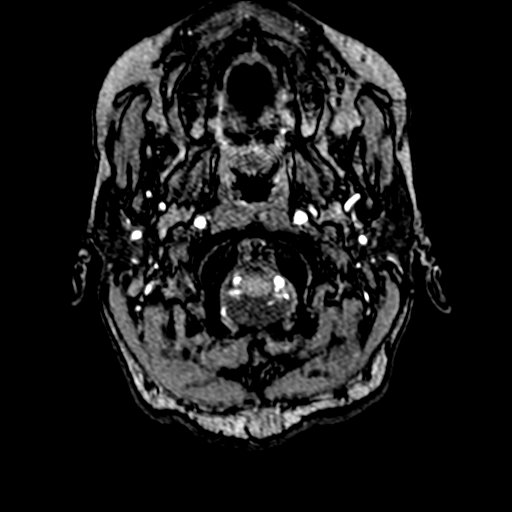
[im 8/188]
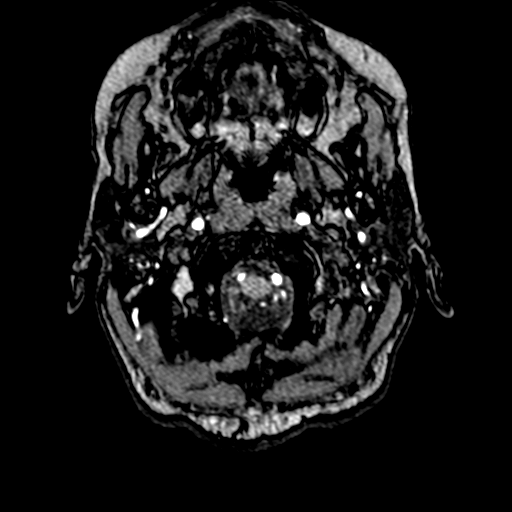
[im 12/188]
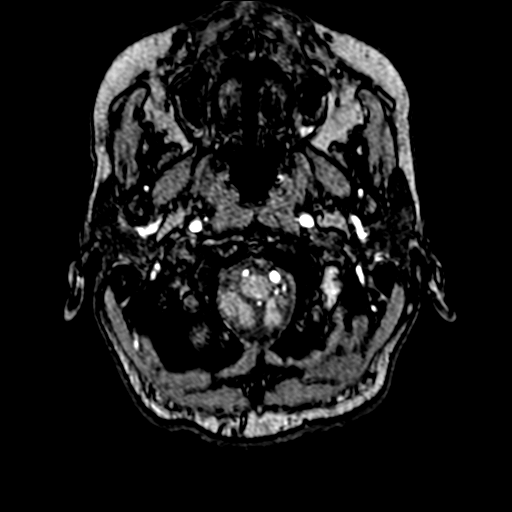
[im 16/188]
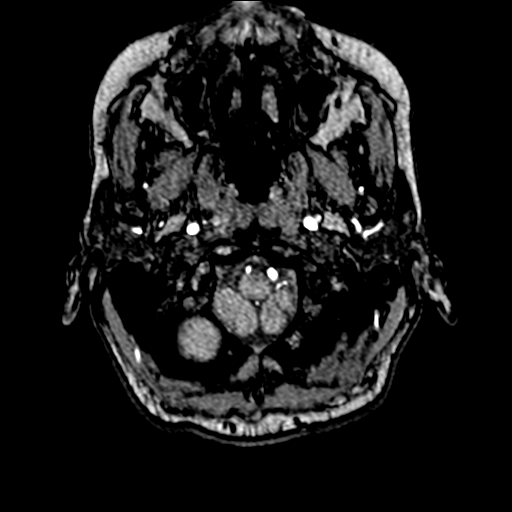
[im 20/188]
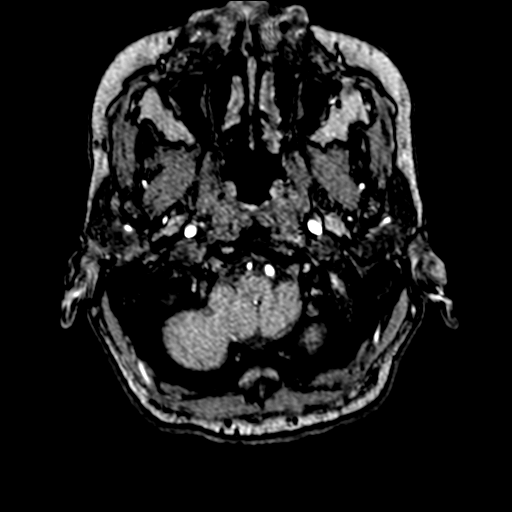
[im 24/188]
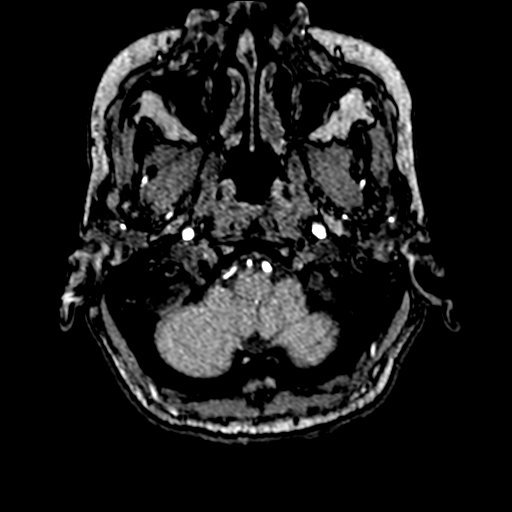
[im 28/188]
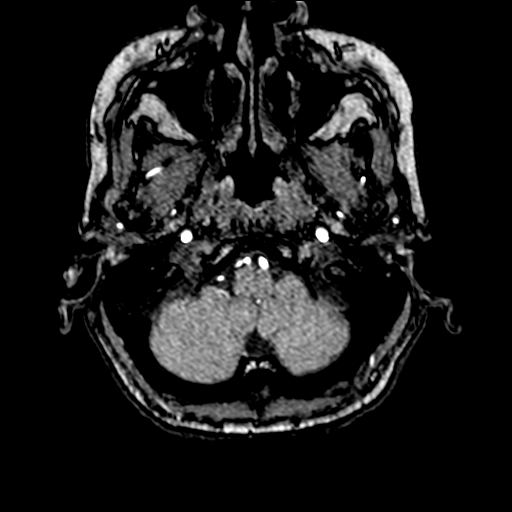
[im 32/188]
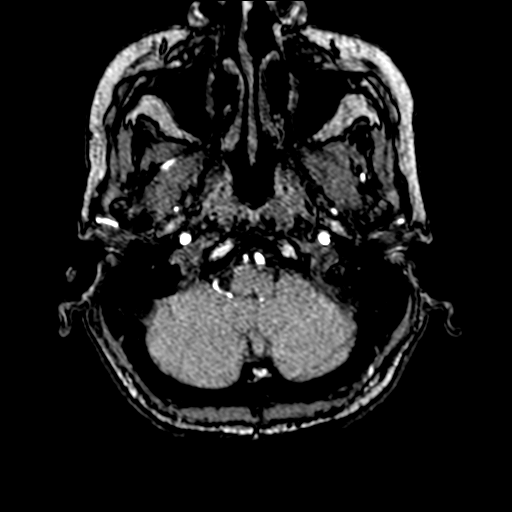
[im 36/188]
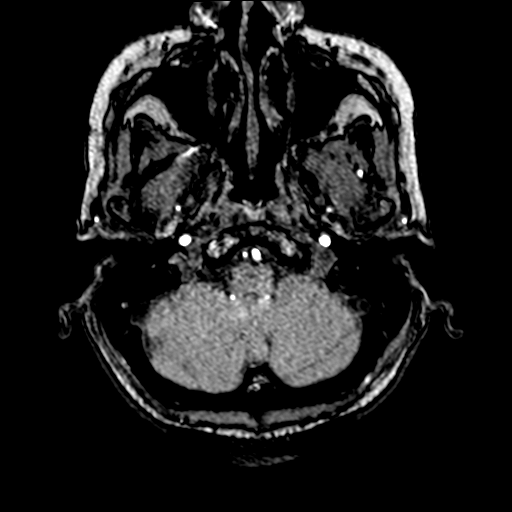
[im 60/188]
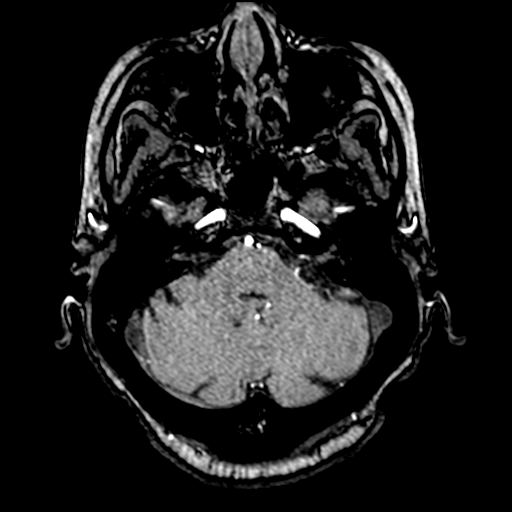
[im 84/188]
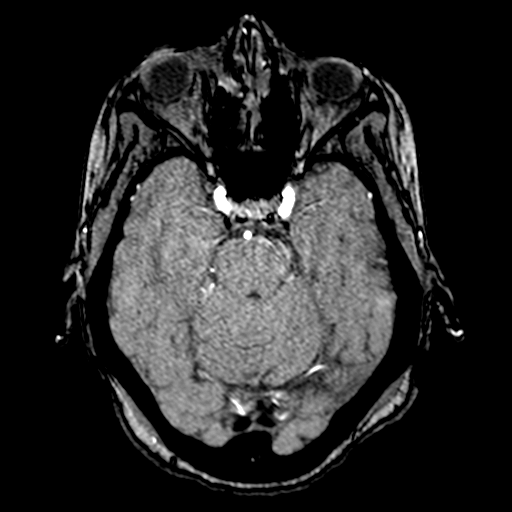
[im 96/188]
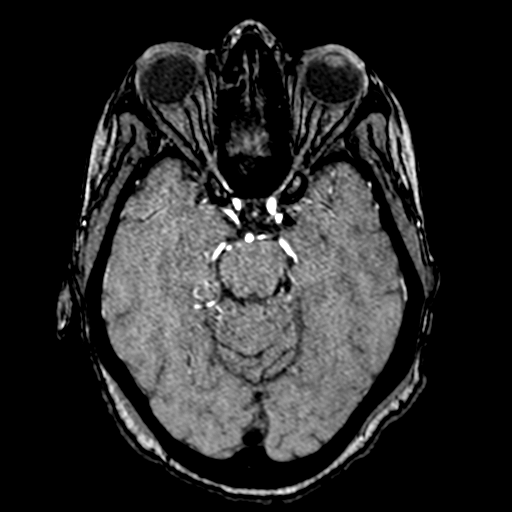
[im 108/188]
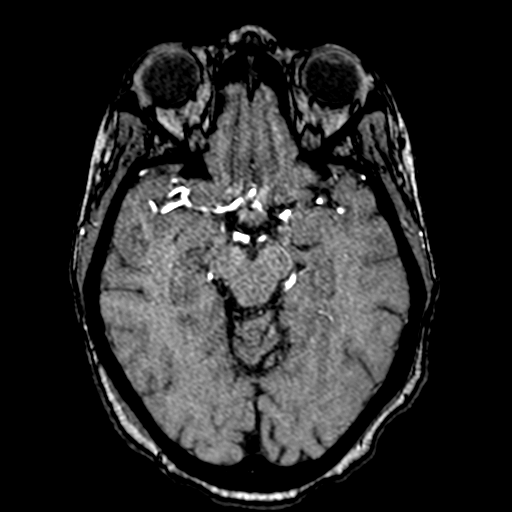
[im 132/188]
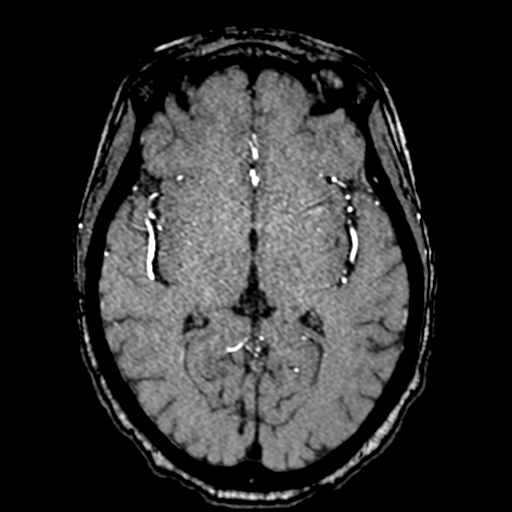
[im 156/188]
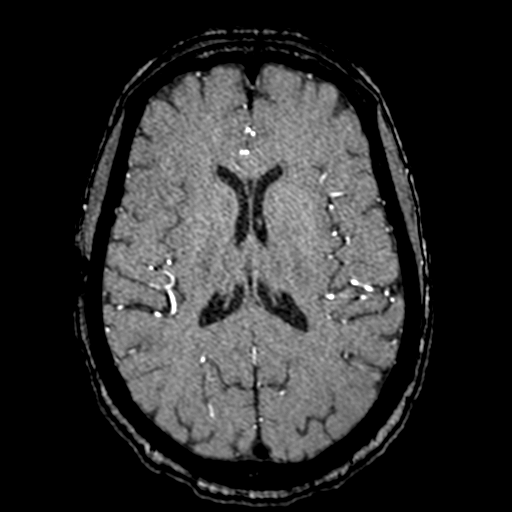
[im 160/188]
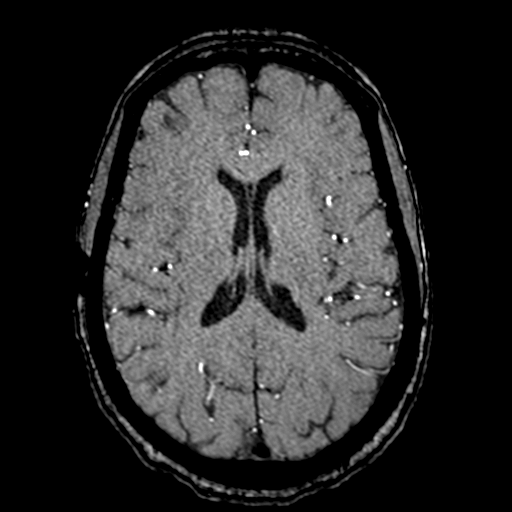
[im 180/188]
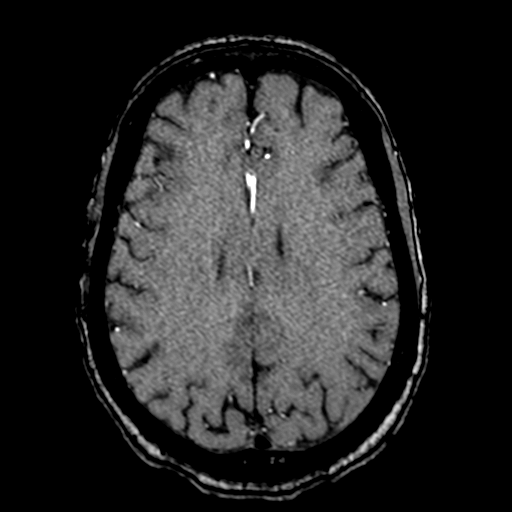

[18 of 48 positions shown; findings below may reference images not displayed]

FINDINGS: Anterior circulation: Antegrade flow in both ICA siphons. Mild
siphon irregularity. No significant siphon stenosis. Patent carotid
termini, MCA and ACA origins.

Both posterior communicating arteries are present. Along the left
posterolateral supraclinoid ICA there is susceptibility artifact
compatible with the embolization coil pack demonstrated by CT
(series 5, image 102 of this exam). Situated between this area and
the medial origin of the left posterior communicating artery is a
saccular lesion tracking caudal, medial to the coil pack on series
5, image 94. This measures 2-3 mm diameter by 4-5 mm length and is
well demonstrated on series [DI] image 5. This was 3 x 4 mm in [DI].
Contralateral right posterior communicating artery origin appears
normal.

Ophthalmic artery origins appear to remain normal. Anterior
communicating artery and visible ACA branches are within normal
limits. MCA M1 segments and MCA tri/bifurcations are patent without
stenosis. Visible bilateral MCA branches are within normal limits.

Posterior circulation: Antegrade flow in the posterior circulation
with dominant distal left vertebral artery. The distal right
vertebral artery remains patent to the vertebrobasilar junction and
both PICA origins appear normal. No distal vertebral stenosis.
Patent basilar artery without stenosis. Patent SCA and PCA origins.
Both posterior communicating arteries are present. Bilateral PCA
branches are within normal limits.

Anatomic variants: Dominant left vertebral artery.

Other: Brain MRI and intracranial MRV reported separately.
IMPRESSION: 1. Chronically treated Left supraclinoid ICA Aneurysm with
associated chronic recurrence and/or coil compaction. The recurrent
saccular Aneurysm component with flow extends caudal along the
medial coil pack now measures up to 3 x 5 mm (versus 3 x 4 mm in
[DI]).

2. Otherwise negative intracranial MRA; no intracranial stenosis or
other intracranial aneurysm identified.

## 2022-02-07 MED ORDER — HYDROCODONE-ACETAMINOPHEN 5-325 MG PO TABS: 1.0000 | ORAL_TABLET | Freq: Four times a day (QID) | ORAL | 0 refills | Status: AC | PRN

## 2022-02-07 MED ORDER — HYDROMORPHONE HCL 1 MG/ML IJ SOLN
0.5000 mg | Freq: Once | INTRAMUSCULAR | Status: AC
  Administered 2022-02-07: 0.5 mg via INTRAVENOUS
  Filled 2022-02-07: qty 1

## 2022-02-07 MED ORDER — GADOBUTROL 1 MMOL/ML IV SOLN
6.0000 mL | Freq: Once | INTRAVENOUS | Status: AC | PRN
  Administered 2022-02-07: 6 mL via INTRAVENOUS

## 2022-02-07 NOTE — Consult Note (Addendum)
Neurology Consultation Reason for Consult: right eye vision loss  Requesting Physician: April Palumbo   CC: "Stroke behind my eye" and right sided headache  History is obtained from:   HPI: Allison Durham is a 56 y.o. female with a past medical history significant for hypertension, hyperlipidemia, type 2 diabetes, renal transplant on MMF and prednisone, left P-comm aneurysm s/p coiling (2012), glaucoma.  She reports that she had a right eye surgery related to retinopathy in January or February, followed by subsequent surgery for cataracts/glaucoma in March/April.  She notes that initially after the cataract surgery her vision was a little bit better but it began to progressively get worse associated with right eye pain and a pressure type headache that was 8-9/10 in intensity.  This was also associated with developing drooping of the right eyelid as well as double vision for the past 3 to 4 weeks.  Subsequently she had another eye injection last Wednesday 5/24 after which her headache became stabbing pain.  She reports some minimal discharge from the eye and notes that the pain has been worse with light exposure but not with any sound exposure.  Her vision change was progressive and gradual and has progressed to total loss of vision in that eye.  No nausea no vomiting.  No fevers no chills.  No other focal neurological symptoms or deficits.  She notes that when she saw her ophthalmologist for this problem on Wednesday they told her that she had had a stroke behind the eye and instructed her to start using prednisone drops, which she was initially using 6 times a day and then twice daily in combination with dorzolamide and timolol.  She additionally uses latanoprost for her glaucoma.  Due to persistent severe headache, she came to the ED herself for further evaluation.  Unfortunately none of the ophthalmological records are available for my review  ROS: All other review of systems was negative except as  noted in the HPI.   Past Medical History:  Diagnosis Date   Anemia    Cerebral aneurysm    Chronic kidney disease    Diabetes mellitus    GERD (gastroesophageal reflux disease)    Glaucoma    Headache(784.0)    2 per week   Hypertension    PT. SEES DR. Nancy Fetter [facility]   Pneumonia    Recurrent upper respiratory infection (URI)    1 -2 months ago, z-pack   Wears dentures    Wears glasses    Past Surgical History:  Procedure Laterality Date   AV FISTULA PLACEMENT Right 08/26/2018   Procedure: ARTERIOVENOUS (AV) BRACHIOCEPHALIC FISTULA CREATION;  Surgeon: Angelia Mould, MD;  Location: [facility];  Service: Vascular;  Laterality: Right;   BIOPSY  09/29/2019   Procedure: BIOPSY;  Surgeon: Otis Brace, MD;  Location: [facility];  Service: Gastroenterology;;   CEREBRAL ANEURYSM REPAIR     COLONOSCOPY     ESOPHAGOGASTRODUODENOSCOPY (EGD) WITH PROPOFOL N/A 09/29/2019   Procedure: ESOPHAGOGASTRODUODENOSCOPY (EGD) WITH PROPOFOL;  Surgeon: Otis Brace, MD;  Location: [facility];  Service: Gastroenterology;  Laterality: N/A;   HEMOSTASIS CLIP PLACEMENT  09/29/2019   Procedure: HEMOSTASIS CLIP PLACEMENT;  Surgeon: Otis Brace, MD;  Location: [facility];  Service: Gastroenterology;;   HOT HEMOSTASIS N/A 09/29/2019   Procedure: HOT HEMOSTASIS (ARGON PLASMA COAGULATION/BICAP);  Surgeon: Otis Brace, MD;  Location: [facility];  Service: Gastroenterology;  Laterality: N/A;   MULTIPLE TOOTH EXTRACTIONS     TUBAL LIGATION  No family history on file.  Social History:  reports that she has never smoked. She has never used smokeless tobacco. She reports current alcohol use. She reports that she does not use drugs.   Exam: Current vital signs: BP (!) 164/73   Pulse 90   Temp 99.6 F (37.6 C) (Oral)   Resp 15   Ht 5\' 2"  (1.575 m)   Wt 69.9 kg   LMP 06/08/2011   SpO2 98%   BMI 28.19 kg/m  Vital signs in last 24 hours: Temp:  [97.7  F (36.5 C)-99.6 F (37.6 C)] 99.6 F (37.6 C) (05/29 2246) Pulse Rate:  [83-102] 90 (05/30 0145) Resp:  [15-19] 15 (05/30 0145) BP: (116-172)/(68-91) 164/73 (05/30 0145) SpO2:  [98 %-100 %] 98 % (05/30 0145) Weight:  [69.9 kg] 69.9 kg (05/29 1250)   Physical Exam  Constitutional: Appears well-developed and well-nourished.  Psych: Affect flat but calm and cooperative Eyes: Sclera injection and mild discharge in the the right eye HENT: No oropharyngeal obstruction.  MSK: no joint deformities.  Cardiovascular:  Perfusing extremities well Respiratory: Effort normal, non-labored breathing GI: Soft.  No distension. There is no tenderness.  Skin: Warm dry and intact visible skin, well-healed scar in the left upper extremity  Neuro: Mental Status: Patient is awake, alert, oriented to person, place, month, year, and situation. Patient is able to give a clear and coherent history. No signs of aphasia or neglect Cranial Nerves: II: Visual Fields are full in the left eye, blind in the right eye. Right pupil dilated and irregular, non reactive, left pupil 3 mm to 1 mm and brisk III,IV, VI: Right eye ptosis, and lateral deviation of the eye though she can overcome this with intact ductions. Left eye normal movements.  V: Facial sensation is reduced in V1 on the right  VII: Facial movement is symmetric.  VIII: hearing is intact to voice and symmetric on Weber test X: Uvula elevates symmetrically XI: Shoulder shrug is symmetric. XII: tongue is midline without atrophy or fasciculations.  Motor: Tone is normal. Bulk is normal. 5/5 strength was present in all four extremities.  Sensory: Sensation is symmetric to light touch and temperature in the arms and legs. Deep Tendon Reflexes: 2+ and symmetric in the brachioradialis and patellae.  Plantars: Toes are downgoing bilaterally.  Cerebellar: FNF and HKS are intact bilaterally Gait:  Deferred   I have reviewed labs in epic and the  results pertinent to this consultation are:  Basic Metabolic Panel: Recent Labs  Lab 02/06/22 1721  NA 140  K 4.1  CL 104  CO2 26  GLUCOSE 128*  BUN 21*  CREATININE 1.54*  CALCIUM 10.6*    CBC: Recent Labs  Lab 02/06/22 1721  WBC 5.4  NEUTROABS 4.1  HGB 13.3  HCT 41.3  MCV 84.5  PLT 265    Coagulation Studies: No results for input(s): LABPROT, INR in the last 72 hours.    Lab Results  Component Value Date   ESRSEDRATE 20 02/07/2022    Lab Results  Component Value Date   CRP 5.1 (H) 02/07/2022     I have reviewed the images obtained: Head CT negative for acute intracranial abnormality   Impression: This is a 56 year old immunosuppressed woman who has undergone recent eye surgery, now presenting with double vision, vision loss and headache. Exam concerning for involvement of CN II, III and ophthalmic branch of V, which localizes to the cavernous sinus and in the context of recent surgery is concerning  for potential cavernous sinus thrombosis/infection.    Recommendations: - MRI brain w/ and w/o,  - MRA head w/o - MRV head w/ and w/o - If imaging is consistent with my suspected diagnosis, patient will need infectious disease consult  - Neurology will follow along closely  Lesleigh Noe MD-PhD [facility] [phone]  Addendum: Imaging above personally reviewed and discussed with Dr. Nevada Crane.  Fortunately there is no evidence of cavernous sinus pathology or other acute intracranial pathology.  Prior aneurysm is stable (and contralateral to the side of symptoms, so anatomically cannot explain her symptoms).  Discussed with ED PA, at this point I would defer to ophthalmology's evaluation.  Pain control with a short course of opiates would be appropriate given she cannot take NSAIDs and Tylenol has been ineffective

## 2022-02-07 NOTE — Discharge Instructions (Addendum)
You were seen in the emergency department today headache, vision changes, and eye pain.  Your MRI studies show findings of your prior aneurysm.  Your blood work overall seems similar to previous studies you have had done.  Your CRP was mildly elevated, please have this rechecked by her primary care provider.  We are sending you home with Norco to take as needed for severe pain.  -Norco-this is a narcotic/controlled substance medication that has potential addicting qualities.  We recommend that you take 1-2 tablets every 6 hours as needed for severe pain.  Do not drive or operate heavy machinery when taking this medicine as it can be sedating. Do not drink alcohol or take other sedating medications when taking this medicine for safety reasons.  Keep this out of reach of small children.  Please be aware this medicine has Tylenol in it (325 mg/tab) do not exceed the maximum dose of Tylenol in a day per over the counter recommendations should you decide to supplement with Tylenol over the counter.   We have prescribed you new medication(s) today. Discuss the medications prescribed today with your pharmacist as they can have adverse effects and interactions with your other medicines including over the counter and prescribed medications. Seek medical evaluation if you start to experience new or abnormal symptoms after taking one of these medicines, seek care immediately if you start to experience difficulty breathing, feeling of your throat closing, facial swelling, or rash as these could be indications of a more serious allergic reaction  Please follow-up with your ophthalmologist, we have discussed your ED visit with your ophthalmologist, their office should be calling you today to schedule a close follow-up appointment.  If they do not call you by lunchtime please call them.  Return to the emergency department for any new or worsening symptoms including but not limited to new or worsening pain, worsening  vision, fever, pain/swelling around the eye, vomiting, numbness, weakness, passing out, or any other concerns.

## 2022-02-07 NOTE — Telephone Encounter (Signed)
Called Allison Durham, and spoke with her.  She reports a dramatic improvement in her headaches since starting the opiate pain medication.  She reports that her eye pain and her facial sensation have improved, and she has no new neurological symptoms.  We discussed that given her immunosuppressed status, in further consideration, an MRI brain with and without contrast does not fully rule out infection, and she may benefit from a lumbar puncture to address this possibility.  However, given that her symptoms are much improved and she has no new symptoms, the likelihood of this is lower.  She understands that she should present to [facility] ED for repeat evaluation for any new symptoms or if her current symptoms again worsen.  In this case I would recommend repeat imaging and lumbar puncture.  Given the chronicity of her symptoms and history I would be concerned for possible indolent meningitis such as a fungal process, though again at this time I am reassured given the improvement of her symptoms with low-dose opiate pain medication  Lesleigh Noe MD-PhD [facility] [phone]  Available 7 PM to 7 AM, outside of these hours please call Neurologist on call as listed on Amion.

## 2022-02-10 DIAGNOSIS — H4051X3 Glaucoma secondary to other eye disorders, right eye, severe stage: Secondary | ICD-10-CM | POA: Diagnosis not present

## 2022-02-10 DIAGNOSIS — H5711 Ocular pain, right eye: Secondary | ICD-10-CM | POA: Diagnosis not present

## 2022-02-10 DIAGNOSIS — N185 Chronic kidney disease, stage 5: Secondary | ICD-10-CM | POA: Diagnosis not present

## 2022-02-10 DIAGNOSIS — R519 Headache, unspecified: Secondary | ICD-10-CM | POA: Diagnosis not present

## 2022-02-10 DIAGNOSIS — E1122 Type 2 diabetes mellitus with diabetic chronic kidney disease: Secondary | ICD-10-CM | POA: Diagnosis not present

## 2022-02-13 DIAGNOSIS — H4051X3 Glaucoma secondary to other eye disorders, right eye, severe stage: Secondary | ICD-10-CM | POA: Diagnosis not present

## 2022-02-20 DIAGNOSIS — I10 Essential (primary) hypertension: Secondary | ICD-10-CM | POA: Diagnosis not present

## 2022-02-20 DIAGNOSIS — Z940 Kidney transplant status: Secondary | ICD-10-CM | POA: Diagnosis not present

## 2022-02-20 DIAGNOSIS — E1165 Type 2 diabetes mellitus with hyperglycemia: Secondary | ICD-10-CM | POA: Diagnosis not present

## 2022-02-22 DIAGNOSIS — I10 Essential (primary) hypertension: Secondary | ICD-10-CM | POA: Diagnosis not present

## 2022-02-22 DIAGNOSIS — E1165 Type 2 diabetes mellitus with hyperglycemia: Secondary | ICD-10-CM | POA: Diagnosis not present

## 2022-03-06 DIAGNOSIS — D631 Anemia in chronic kidney disease: Secondary | ICD-10-CM | POA: Diagnosis not present

## 2022-03-06 DIAGNOSIS — Z940 Kidney transplant status: Secondary | ICD-10-CM | POA: Diagnosis not present

## 2022-03-06 DIAGNOSIS — N183 Chronic kidney disease, stage 3 unspecified: Secondary | ICD-10-CM | POA: Diagnosis not present

## 2022-03-06 DIAGNOSIS — I129 Hypertensive chronic kidney disease with stage 1 through stage 4 chronic kidney disease, or unspecified chronic kidney disease: Secondary | ICD-10-CM | POA: Diagnosis not present

## 2022-03-15 DIAGNOSIS — E113511 Type 2 diabetes mellitus with proliferative diabetic retinopathy with macular edema, right eye: Secondary | ICD-10-CM | POA: Diagnosis not present

## 2022-04-14 DIAGNOSIS — E113521 Type 2 diabetes mellitus with proliferative diabetic retinopathy with traction retinal detachment involving the macula, right eye: Secondary | ICD-10-CM | POA: Diagnosis not present

## 2022-04-18 DIAGNOSIS — E1165 Type 2 diabetes mellitus with hyperglycemia: Secondary | ICD-10-CM | POA: Diagnosis not present

## 2022-04-18 DIAGNOSIS — E118 Type 2 diabetes mellitus with unspecified complications: Secondary | ICD-10-CM | POA: Diagnosis not present

## 2022-04-18 DIAGNOSIS — Z794 Long term (current) use of insulin: Secondary | ICD-10-CM | POA: Diagnosis not present

## 2022-04-21 DIAGNOSIS — H4051X3 Glaucoma secondary to other eye disorders, right eye, severe stage: Secondary | ICD-10-CM | POA: Diagnosis not present

## 2022-05-19 DIAGNOSIS — E113521 Type 2 diabetes mellitus with proliferative diabetic retinopathy with traction retinal detachment involving the macula, right eye: Secondary | ICD-10-CM | POA: Diagnosis not present

## 2022-06-05 DIAGNOSIS — N183 Chronic kidney disease, stage 3 unspecified: Secondary | ICD-10-CM | POA: Diagnosis not present

## 2022-06-05 DIAGNOSIS — E1122 Type 2 diabetes mellitus with diabetic chronic kidney disease: Secondary | ICD-10-CM | POA: Diagnosis not present

## 2022-06-05 DIAGNOSIS — I129 Hypertensive chronic kidney disease with stage 1 through stage 4 chronic kidney disease, or unspecified chronic kidney disease: Secondary | ICD-10-CM | POA: Diagnosis not present

## 2022-06-05 DIAGNOSIS — Z940 Kidney transplant status: Secondary | ICD-10-CM | POA: Diagnosis not present

## 2022-06-26 DIAGNOSIS — H25812 Combined forms of age-related cataract, left eye: Secondary | ICD-10-CM | POA: Diagnosis not present

## 2022-06-26 DIAGNOSIS — E119 Type 2 diabetes mellitus without complications: Secondary | ICD-10-CM | POA: Diagnosis not present

## 2022-06-26 DIAGNOSIS — H269 Unspecified cataract: Secondary | ICD-10-CM | POA: Diagnosis not present

## 2022-07-18 DIAGNOSIS — E1165 Type 2 diabetes mellitus with hyperglycemia: Secondary | ICD-10-CM | POA: Diagnosis not present

## 2022-07-18 DIAGNOSIS — Z794 Long term (current) use of insulin: Secondary | ICD-10-CM | POA: Diagnosis not present

## 2022-07-18 DIAGNOSIS — E118 Type 2 diabetes mellitus with unspecified complications: Secondary | ICD-10-CM | POA: Diagnosis not present

## 2022-07-21 DIAGNOSIS — E113512 Type 2 diabetes mellitus with proliferative diabetic retinopathy with macular edema, left eye: Secondary | ICD-10-CM | POA: Diagnosis not present

## 2022-08-17 DIAGNOSIS — Z9641 Presence of insulin pump (external) (internal): Secondary | ICD-10-CM | POA: Diagnosis not present

## 2022-08-17 DIAGNOSIS — Z940 Kidney transplant status: Secondary | ICD-10-CM | POA: Diagnosis not present

## 2022-08-17 DIAGNOSIS — I10 Essential (primary) hypertension: Secondary | ICD-10-CM | POA: Diagnosis not present

## 2022-08-17 DIAGNOSIS — E1165 Type 2 diabetes mellitus with hyperglycemia: Secondary | ICD-10-CM | POA: Diagnosis not present

## 2022-08-29 DIAGNOSIS — Z940 Kidney transplant status: Secondary | ICD-10-CM | POA: Diagnosis not present

## 2022-08-29 DIAGNOSIS — Z9641 Presence of insulin pump (external) (internal): Secondary | ICD-10-CM | POA: Diagnosis not present

## 2022-08-29 DIAGNOSIS — E1165 Type 2 diabetes mellitus with hyperglycemia: Secondary | ICD-10-CM | POA: Diagnosis not present

## 2022-08-29 DIAGNOSIS — I10 Essential (primary) hypertension: Secondary | ICD-10-CM | POA: Diagnosis not present

## 2022-09-07 DIAGNOSIS — I671 Cerebral aneurysm, nonruptured: Secondary | ICD-10-CM | POA: Diagnosis not present

## 2022-09-08 DIAGNOSIS — I671 Cerebral aneurysm, nonruptured: Secondary | ICD-10-CM | POA: Diagnosis not present

## 2022-09-08 DIAGNOSIS — Z8679 Personal history of other diseases of the circulatory system: Secondary | ICD-10-CM | POA: Diagnosis not present

## 2022-09-08 DIAGNOSIS — Z95828 Presence of other vascular implants and grafts: Secondary | ICD-10-CM | POA: Diagnosis not present

## 2022-09-28 DIAGNOSIS — Z940 Kidney transplant status: Secondary | ICD-10-CM | POA: Diagnosis not present

## 2022-09-28 DIAGNOSIS — N183 Chronic kidney disease, stage 3 unspecified: Secondary | ICD-10-CM | POA: Diagnosis not present

## 2022-09-28 DIAGNOSIS — Z79899 Other long term (current) drug therapy: Secondary | ICD-10-CM | POA: Diagnosis not present

## 2022-09-28 DIAGNOSIS — E1122 Type 2 diabetes mellitus with diabetic chronic kidney disease: Secondary | ICD-10-CM | POA: Diagnosis not present

## 2022-09-28 DIAGNOSIS — N2581 Secondary hyperparathyroidism of renal origin: Secondary | ICD-10-CM | POA: Diagnosis not present

## 2022-09-28 DIAGNOSIS — I129 Hypertensive chronic kidney disease with stage 1 through stage 4 chronic kidney disease, or unspecified chronic kidney disease: Secondary | ICD-10-CM | POA: Diagnosis not present

## 2022-10-12 DIAGNOSIS — Z794 Long term (current) use of insulin: Secondary | ICD-10-CM | POA: Diagnosis not present

## 2022-10-12 DIAGNOSIS — E1165 Type 2 diabetes mellitus with hyperglycemia: Secondary | ICD-10-CM | POA: Diagnosis not present

## 2022-10-12 DIAGNOSIS — E118 Type 2 diabetes mellitus with unspecified complications: Secondary | ICD-10-CM | POA: Diagnosis not present

## 2022-10-30 DIAGNOSIS — I10 Essential (primary) hypertension: Secondary | ICD-10-CM | POA: Diagnosis not present

## 2022-10-30 DIAGNOSIS — Z940 Kidney transplant status: Secondary | ICD-10-CM | POA: Diagnosis not present

## 2022-10-30 DIAGNOSIS — E1165 Type 2 diabetes mellitus with hyperglycemia: Secondary | ICD-10-CM | POA: Diagnosis not present

## 2022-11-09 DIAGNOSIS — Z940 Kidney transplant status: Secondary | ICD-10-CM | POA: Diagnosis not present

## 2022-11-09 DIAGNOSIS — R509 Fever, unspecified: Secondary | ICD-10-CM | POA: Diagnosis not present

## 2022-11-17 DIAGNOSIS — R0989 Other specified symptoms and signs involving the circulatory and respiratory systems: Secondary | ICD-10-CM | POA: Diagnosis not present

## 2022-11-28 DIAGNOSIS — Z9641 Presence of insulin pump (external) (internal): Secondary | ICD-10-CM | POA: Diagnosis not present

## 2022-11-28 DIAGNOSIS — R809 Proteinuria, unspecified: Secondary | ICD-10-CM | POA: Diagnosis not present

## 2022-11-28 DIAGNOSIS — Z940 Kidney transplant status: Secondary | ICD-10-CM | POA: Diagnosis not present

## 2022-11-28 DIAGNOSIS — E1165 Type 2 diabetes mellitus with hyperglycemia: Secondary | ICD-10-CM | POA: Diagnosis not present

## 2022-12-13 DIAGNOSIS — Z940 Kidney transplant status: Secondary | ICD-10-CM | POA: Diagnosis not present

## 2022-12-27 DIAGNOSIS — E113512 Type 2 diabetes mellitus with proliferative diabetic retinopathy with macular edema, left eye: Secondary | ICD-10-CM | POA: Diagnosis not present

## 2022-12-29 DIAGNOSIS — Z940 Kidney transplant status: Secondary | ICD-10-CM | POA: Diagnosis not present

## 2023-01-02 DIAGNOSIS — Z794 Long term (current) use of insulin: Secondary | ICD-10-CM | POA: Diagnosis not present

## 2023-01-02 DIAGNOSIS — E118 Type 2 diabetes mellitus with unspecified complications: Secondary | ICD-10-CM | POA: Diagnosis not present

## 2023-01-02 DIAGNOSIS — E1165 Type 2 diabetes mellitus with hyperglycemia: Secondary | ICD-10-CM | POA: Diagnosis not present

## 2023-01-16 DIAGNOSIS — H4051X3 Glaucoma secondary to other eye disorders, right eye, severe stage: Secondary | ICD-10-CM | POA: Diagnosis not present

## 2023-02-15 DIAGNOSIS — R809 Proteinuria, unspecified: Secondary | ICD-10-CM | POA: Diagnosis not present

## 2023-02-15 DIAGNOSIS — Z9641 Presence of insulin pump (external) (internal): Secondary | ICD-10-CM | POA: Diagnosis not present

## 2023-02-15 DIAGNOSIS — E1165 Type 2 diabetes mellitus with hyperglycemia: Secondary | ICD-10-CM | POA: Diagnosis not present

## 2023-02-15 DIAGNOSIS — Z940 Kidney transplant status: Secondary | ICD-10-CM | POA: Diagnosis not present

## 2023-02-20 DIAGNOSIS — H4051X3 Glaucoma secondary to other eye disorders, right eye, severe stage: Secondary | ICD-10-CM | POA: Diagnosis not present

## 2023-03-06 DIAGNOSIS — Z940 Kidney transplant status: Secondary | ICD-10-CM | POA: Diagnosis not present

## 2023-03-06 DIAGNOSIS — E1165 Type 2 diabetes mellitus with hyperglycemia: Secondary | ICD-10-CM | POA: Diagnosis not present

## 2023-03-06 DIAGNOSIS — Z9641 Presence of insulin pump (external) (internal): Secondary | ICD-10-CM | POA: Diagnosis not present

## 2023-03-06 DIAGNOSIS — R809 Proteinuria, unspecified: Secondary | ICD-10-CM | POA: Diagnosis not present

## 2023-03-20 DIAGNOSIS — H4051X3 Glaucoma secondary to other eye disorders, right eye, severe stage: Secondary | ICD-10-CM | POA: Diagnosis not present

## 2023-03-26 DIAGNOSIS — Z940 Kidney transplant status: Secondary | ICD-10-CM | POA: Diagnosis not present

## 2023-04-05 DIAGNOSIS — E1122 Type 2 diabetes mellitus with diabetic chronic kidney disease: Secondary | ICD-10-CM | POA: Diagnosis not present

## 2023-04-05 DIAGNOSIS — N183 Chronic kidney disease, stage 3 unspecified: Secondary | ICD-10-CM | POA: Diagnosis not present

## 2023-04-05 DIAGNOSIS — Z940 Kidney transplant status: Secondary | ICD-10-CM | POA: Diagnosis not present

## 2023-04-05 DIAGNOSIS — I129 Hypertensive chronic kidney disease with stage 1 through stage 4 chronic kidney disease, or unspecified chronic kidney disease: Secondary | ICD-10-CM | POA: Diagnosis not present

## 2023-04-06 ENCOUNTER — Other Ambulatory Visit: Payer: Self-pay | Admitting: Nephrology

## 2023-04-06 DIAGNOSIS — Z940 Kidney transplant status: Secondary | ICD-10-CM

## 2023-04-12 DIAGNOSIS — Z940 Kidney transplant status: Secondary | ICD-10-CM | POA: Diagnosis not present

## 2023-05-01 ENCOUNTER — Ambulatory Visit: Admission: RE | Admit: 2023-05-01 | Payer: BC Managed Care – PPO | Source: Ambulatory Visit

## 2023-05-01 DIAGNOSIS — Z940 Kidney transplant status: Secondary | ICD-10-CM | POA: Diagnosis not present

## 2023-06-08 DIAGNOSIS — Z794 Long term (current) use of insulin: Secondary | ICD-10-CM | POA: Diagnosis not present

## 2023-06-08 DIAGNOSIS — E1165 Type 2 diabetes mellitus with hyperglycemia: Secondary | ICD-10-CM | POA: Diagnosis not present

## 2023-06-08 DIAGNOSIS — E118 Type 2 diabetes mellitus with unspecified complications: Secondary | ICD-10-CM | POA: Diagnosis not present

## 2023-06-22 DIAGNOSIS — N189 Chronic kidney disease, unspecified: Secondary | ICD-10-CM | POA: Diagnosis not present

## 2023-06-22 DIAGNOSIS — I129 Hypertensive chronic kidney disease with stage 1 through stage 4 chronic kidney disease, or unspecified chronic kidney disease: Secondary | ICD-10-CM | POA: Diagnosis not present

## 2023-06-22 DIAGNOSIS — Z940 Kidney transplant status: Secondary | ICD-10-CM | POA: Diagnosis not present

## 2023-06-22 DIAGNOSIS — N183 Chronic kidney disease, stage 3 unspecified: Secondary | ICD-10-CM | POA: Diagnosis not present

## 2023-06-26 DIAGNOSIS — H4051X3 Glaucoma secondary to other eye disorders, right eye, severe stage: Secondary | ICD-10-CM | POA: Diagnosis not present

## 2023-07-06 DIAGNOSIS — E113512 Type 2 diabetes mellitus with proliferative diabetic retinopathy with macular edema, left eye: Secondary | ICD-10-CM | POA: Diagnosis not present

## 2023-07-09 DIAGNOSIS — Z9641 Presence of insulin pump (external) (internal): Secondary | ICD-10-CM | POA: Diagnosis not present

## 2023-07-09 DIAGNOSIS — Z940 Kidney transplant status: Secondary | ICD-10-CM | POA: Diagnosis not present

## 2023-07-09 DIAGNOSIS — E1165 Type 2 diabetes mellitus with hyperglycemia: Secondary | ICD-10-CM | POA: Diagnosis not present

## 2023-07-09 DIAGNOSIS — I10 Essential (primary) hypertension: Secondary | ICD-10-CM | POA: Diagnosis not present

## 2023-07-31 DIAGNOSIS — Z940 Kidney transplant status: Secondary | ICD-10-CM | POA: Diagnosis not present

## 2023-07-31 DIAGNOSIS — N183 Chronic kidney disease, stage 3 unspecified: Secondary | ICD-10-CM | POA: Diagnosis not present

## 2023-07-31 DIAGNOSIS — I129 Hypertensive chronic kidney disease with stage 1 through stage 4 chronic kidney disease, or unspecified chronic kidney disease: Secondary | ICD-10-CM | POA: Diagnosis not present

## 2023-07-31 DIAGNOSIS — E1122 Type 2 diabetes mellitus with diabetic chronic kidney disease: Secondary | ICD-10-CM | POA: Diagnosis not present

## 2023-09-09 DIAGNOSIS — R051 Acute cough: Secondary | ICD-10-CM | POA: Diagnosis not present

## 2023-09-09 DIAGNOSIS — J0140 Acute pansinusitis, unspecified: Secondary | ICD-10-CM | POA: Diagnosis not present

## 2023-09-26 DIAGNOSIS — Z940 Kidney transplant status: Secondary | ICD-10-CM | POA: Diagnosis not present

## 2023-09-26 DIAGNOSIS — N183 Chronic kidney disease, stage 3 unspecified: Secondary | ICD-10-CM | POA: Diagnosis not present

## 2023-10-16 DIAGNOSIS — H4051X3 Glaucoma secondary to other eye disorders, right eye, severe stage: Secondary | ICD-10-CM | POA: Diagnosis not present

## 2023-12-31 DIAGNOSIS — Z1231 Encounter for screening mammogram for malignant neoplasm of breast: Secondary | ICD-10-CM | POA: Diagnosis not present

## 2023-12-31 DIAGNOSIS — Z1151 Encounter for screening for human papillomavirus (HPV): Secondary | ICD-10-CM | POA: Diagnosis not present

## 2023-12-31 DIAGNOSIS — Z6832 Body mass index (BMI) 32.0-32.9, adult: Secondary | ICD-10-CM | POA: Diagnosis not present

## 2023-12-31 DIAGNOSIS — Z01419 Encounter for gynecological examination (general) (routine) without abnormal findings: Secondary | ICD-10-CM | POA: Diagnosis not present

## 2023-12-31 DIAGNOSIS — Z124 Encounter for screening for malignant neoplasm of cervix: Secondary | ICD-10-CM | POA: Diagnosis not present

## 2024-01-03 DIAGNOSIS — E118 Type 2 diabetes mellitus with unspecified complications: Secondary | ICD-10-CM | POA: Diagnosis not present

## 2024-01-03 DIAGNOSIS — E1165 Type 2 diabetes mellitus with hyperglycemia: Secondary | ICD-10-CM | POA: Diagnosis not present

## 2024-01-03 DIAGNOSIS — Z794 Long term (current) use of insulin: Secondary | ICD-10-CM | POA: Diagnosis not present

## 2024-01-07 DIAGNOSIS — Z940 Kidney transplant status: Secondary | ICD-10-CM | POA: Diagnosis not present

## 2024-01-07 DIAGNOSIS — I129 Hypertensive chronic kidney disease with stage 1 through stage 4 chronic kidney disease, or unspecified chronic kidney disease: Secondary | ICD-10-CM | POA: Diagnosis not present

## 2024-01-07 DIAGNOSIS — N189 Chronic kidney disease, unspecified: Secondary | ICD-10-CM | POA: Diagnosis not present

## 2024-01-07 DIAGNOSIS — N183 Chronic kidney disease, stage 3 unspecified: Secondary | ICD-10-CM | POA: Diagnosis not present

## 2024-01-21 DIAGNOSIS — Z940 Kidney transplant status: Secondary | ICD-10-CM | POA: Diagnosis not present

## 2024-02-07 DIAGNOSIS — N183 Chronic kidney disease, stage 3 unspecified: Secondary | ICD-10-CM | POA: Diagnosis not present

## 2024-02-07 DIAGNOSIS — Z940 Kidney transplant status: Secondary | ICD-10-CM | POA: Diagnosis not present

## 2024-02-07 DIAGNOSIS — E1122 Type 2 diabetes mellitus with diabetic chronic kidney disease: Secondary | ICD-10-CM | POA: Diagnosis not present

## 2024-02-07 DIAGNOSIS — I129 Hypertensive chronic kidney disease with stage 1 through stage 4 chronic kidney disease, or unspecified chronic kidney disease: Secondary | ICD-10-CM | POA: Diagnosis not present

## 2024-03-26 DIAGNOSIS — Z0000 Encounter for general adult medical examination without abnormal findings: Secondary | ICD-10-CM | POA: Diagnosis not present

## 2024-03-26 DIAGNOSIS — Z23 Encounter for immunization: Secondary | ICD-10-CM | POA: Diagnosis not present

## 2024-04-07 DIAGNOSIS — Z940 Kidney transplant status: Secondary | ICD-10-CM | POA: Diagnosis not present

## 2024-04-07 DIAGNOSIS — N189 Chronic kidney disease, unspecified: Secondary | ICD-10-CM | POA: Diagnosis not present

## 2024-04-07 DIAGNOSIS — I129 Hypertensive chronic kidney disease with stage 1 through stage 4 chronic kidney disease, or unspecified chronic kidney disease: Secondary | ICD-10-CM | POA: Diagnosis not present

## 2024-04-15 DIAGNOSIS — H4051X3 Glaucoma secondary to other eye disorders, right eye, severe stage: Secondary | ICD-10-CM | POA: Diagnosis not present

## 2024-04-25 DIAGNOSIS — Z940 Kidney transplant status: Secondary | ICD-10-CM | POA: Diagnosis not present

## 2024-04-28 DIAGNOSIS — E1165 Type 2 diabetes mellitus with hyperglycemia: Secondary | ICD-10-CM | POA: Diagnosis not present

## 2024-04-28 DIAGNOSIS — Z79899 Other long term (current) drug therapy: Secondary | ICD-10-CM | POA: Diagnosis not present

## 2024-04-28 DIAGNOSIS — Z940 Kidney transplant status: Secondary | ICD-10-CM | POA: Diagnosis not present

## 2024-04-28 DIAGNOSIS — Z9641 Presence of insulin pump (external) (internal): Secondary | ICD-10-CM | POA: Diagnosis not present

## 2024-04-28 DIAGNOSIS — I10 Essential (primary) hypertension: Secondary | ICD-10-CM | POA: Diagnosis not present

## 2024-04-30 DIAGNOSIS — E113512 Type 2 diabetes mellitus with proliferative diabetic retinopathy with macular edema, left eye: Secondary | ICD-10-CM | POA: Diagnosis not present

## 2024-05-01 DIAGNOSIS — I129 Hypertensive chronic kidney disease with stage 1 through stage 4 chronic kidney disease, or unspecified chronic kidney disease: Secondary | ICD-10-CM | POA: Diagnosis not present

## 2024-05-01 DIAGNOSIS — N1832 Chronic kidney disease, stage 3b: Secondary | ICD-10-CM | POA: Diagnosis not present

## 2024-05-01 DIAGNOSIS — Z940 Kidney transplant status: Secondary | ICD-10-CM | POA: Diagnosis not present

## 2024-05-01 DIAGNOSIS — R7989 Other specified abnormal findings of blood chemistry: Secondary | ICD-10-CM | POA: Diagnosis not present

## 2024-05-08 DIAGNOSIS — Z940 Kidney transplant status: Secondary | ICD-10-CM | POA: Diagnosis not present

## 2024-05-13 ENCOUNTER — Other Ambulatory Visit: Payer: Self-pay | Admitting: Nephrology

## 2024-05-13 DIAGNOSIS — Z940 Kidney transplant status: Secondary | ICD-10-CM

## 2024-06-03 DIAGNOSIS — E113512 Type 2 diabetes mellitus with proliferative diabetic retinopathy with macular edema, left eye: Secondary | ICD-10-CM | POA: Diagnosis not present

## 2024-06-09 DIAGNOSIS — N184 Chronic kidney disease, stage 4 (severe): Secondary | ICD-10-CM | POA: Diagnosis not present

## 2024-06-09 DIAGNOSIS — Z940 Kidney transplant status: Secondary | ICD-10-CM | POA: Diagnosis not present

## 2024-06-09 DIAGNOSIS — N1832 Chronic kidney disease, stage 3b: Secondary | ICD-10-CM | POA: Diagnosis not present

## 2024-06-09 DIAGNOSIS — I129 Hypertensive chronic kidney disease with stage 1 through stage 4 chronic kidney disease, or unspecified chronic kidney disease: Secondary | ICD-10-CM | POA: Diagnosis not present

## 2024-06-26 DIAGNOSIS — E118 Type 2 diabetes mellitus with unspecified complications: Secondary | ICD-10-CM | POA: Diagnosis not present

## 2024-06-26 DIAGNOSIS — E1165 Type 2 diabetes mellitus with hyperglycemia: Secondary | ICD-10-CM | POA: Diagnosis not present

## 2024-06-26 DIAGNOSIS — Z794 Long term (current) use of insulin: Secondary | ICD-10-CM | POA: Diagnosis not present

## 2024-07-02 DIAGNOSIS — E11319 Type 2 diabetes mellitus with unspecified diabetic retinopathy without macular edema: Secondary | ICD-10-CM | POA: Diagnosis not present

## 2024-07-02 DIAGNOSIS — D849 Immunodeficiency, unspecified: Secondary | ICD-10-CM | POA: Diagnosis not present

## 2024-07-02 DIAGNOSIS — Z794 Long term (current) use of insulin: Secondary | ICD-10-CM | POA: Diagnosis not present

## 2024-07-02 DIAGNOSIS — Z4822 Encounter for aftercare following kidney transplant: Secondary | ICD-10-CM | POA: Diagnosis not present

## 2024-07-02 DIAGNOSIS — E785 Hyperlipidemia, unspecified: Secondary | ICD-10-CM | POA: Diagnosis not present

## 2024-07-02 DIAGNOSIS — E119 Type 2 diabetes mellitus without complications: Secondary | ICD-10-CM | POA: Diagnosis not present

## 2024-07-02 DIAGNOSIS — I10 Essential (primary) hypertension: Secondary | ICD-10-CM | POA: Diagnosis not present

## 2024-07-02 DIAGNOSIS — K5900 Constipation, unspecified: Secondary | ICD-10-CM | POA: Diagnosis not present

## 2024-07-02 DIAGNOSIS — Z48298 Encounter for aftercare following other organ transplant: Secondary | ICD-10-CM | POA: Diagnosis not present

## 2024-07-02 DIAGNOSIS — Z940 Kidney transplant status: Secondary | ICD-10-CM | POA: Diagnosis not present

## 2024-07-02 DIAGNOSIS — Z79621 Long term (current) use of calcineurin inhibitor: Secondary | ICD-10-CM | POA: Diagnosis not present

## 2024-07-02 DIAGNOSIS — Z23 Encounter for immunization: Secondary | ICD-10-CM | POA: Diagnosis not present

## 2024-07-02 DIAGNOSIS — Z5181 Encounter for therapeutic drug level monitoring: Secondary | ICD-10-CM | POA: Diagnosis not present

## 2024-07-02 DIAGNOSIS — D84821 Immunodeficiency due to drugs: Secondary | ICD-10-CM | POA: Diagnosis not present

## 2024-07-10 DIAGNOSIS — I129 Hypertensive chronic kidney disease with stage 1 through stage 4 chronic kidney disease, or unspecified chronic kidney disease: Secondary | ICD-10-CM | POA: Diagnosis not present

## 2024-07-10 DIAGNOSIS — N1832 Chronic kidney disease, stage 3b: Secondary | ICD-10-CM | POA: Diagnosis not present

## 2024-07-10 DIAGNOSIS — D631 Anemia in chronic kidney disease: Secondary | ICD-10-CM | POA: Diagnosis not present

## 2024-07-10 DIAGNOSIS — Z940 Kidney transplant status: Secondary | ICD-10-CM | POA: Diagnosis not present

## 2024-07-15 ENCOUNTER — Other Ambulatory Visit: Payer: Self-pay | Admitting: Nephrology

## 2024-07-15 DIAGNOSIS — R7989 Other specified abnormal findings of blood chemistry: Secondary | ICD-10-CM

## 2024-07-29 ENCOUNTER — Other Ambulatory Visit

## 2024-08-05 DIAGNOSIS — E1165 Type 2 diabetes mellitus with hyperglycemia: Secondary | ICD-10-CM | POA: Diagnosis not present

## 2024-08-05 DIAGNOSIS — Z940 Kidney transplant status: Secondary | ICD-10-CM | POA: Diagnosis not present

## 2024-08-05 DIAGNOSIS — I10 Essential (primary) hypertension: Secondary | ICD-10-CM | POA: Diagnosis not present

## 2024-08-05 DIAGNOSIS — Z9641 Presence of insulin pump (external) (internal): Secondary | ICD-10-CM | POA: Diagnosis not present

## 2024-08-13 ENCOUNTER — Encounter (HOSPITAL_COMMUNITY): Payer: Self-pay

## 2024-08-13 ENCOUNTER — Ambulatory Visit
Admission: RE | Admit: 2024-08-13 | Discharge: 2024-08-13 | Disposition: A | Source: Ambulatory Visit | Attending: Nephrology

## 2024-08-13 DIAGNOSIS — R7989 Other specified abnormal findings of blood chemistry: Secondary | ICD-10-CM

## 2024-08-13 DIAGNOSIS — Z940 Kidney transplant status: Secondary | ICD-10-CM | POA: Diagnosis not present

## 2024-08-13 DIAGNOSIS — R7401 Elevation of levels of liver transaminase levels: Secondary | ICD-10-CM | POA: Diagnosis not present

## 2024-08-28 DIAGNOSIS — E1165 Type 2 diabetes mellitus with hyperglycemia: Secondary | ICD-10-CM | POA: Diagnosis not present
